# Patient Record
Sex: Female | Born: 1955 | Race: White | Hispanic: No | Marital: Single | Smoking: Never smoker
Health system: Southern US, Community
[De-identification: ages and names within clinical notes are randomized; demographics above are authoritative.]

## PROBLEM LIST (undated history)

## (undated) DIAGNOSIS — IMO0001 Reserved for inherently not codable concepts without codable children: Secondary | ICD-10-CM

## (undated) DIAGNOSIS — M545 Low back pain, unspecified: Secondary | ICD-10-CM

## (undated) DIAGNOSIS — Z9109 Other allergy status, other than to drugs and biological substances: Secondary | ICD-10-CM

## (undated) DIAGNOSIS — R590 Localized enlarged lymph nodes: Secondary | ICD-10-CM

## (undated) DIAGNOSIS — K5909 Other constipation: Secondary | ICD-10-CM

## (undated) DIAGNOSIS — D509 Iron deficiency anemia, unspecified: Secondary | ICD-10-CM

## (undated) DIAGNOSIS — IMO0002 Reserved for concepts with insufficient information to code with codable children: Secondary | ICD-10-CM

## (undated) DIAGNOSIS — Z923 Personal history of irradiation: Secondary | ICD-10-CM

## (undated) DIAGNOSIS — C539 Malignant neoplasm of cervix uteri, unspecified: Secondary | ICD-10-CM

## (undated) DIAGNOSIS — N289 Disorder of kidney and ureter, unspecified: Secondary | ICD-10-CM

## (undated) DIAGNOSIS — N135 Crossing vessel and stricture of ureter without hydronephrosis: Secondary | ICD-10-CM

## (undated) DIAGNOSIS — J45909 Unspecified asthma, uncomplicated: Secondary | ICD-10-CM

## (undated) DIAGNOSIS — Z8742 Personal history of other diseases of the female genital tract: Secondary | ICD-10-CM

## (undated) DIAGNOSIS — L309 Dermatitis, unspecified: Secondary | ICD-10-CM

## (undated) DIAGNOSIS — Z95828 Presence of other vascular implants and grafts: Secondary | ICD-10-CM

## (undated) DIAGNOSIS — K269 Duodenal ulcer, unspecified as acute or chronic, without hemorrhage or perforation: Secondary | ICD-10-CM

## (undated) DIAGNOSIS — K649 Unspecified hemorrhoids: Secondary | ICD-10-CM

## (undated) HISTORY — DX: Personal history of irradiation: Z92.3

## (undated) HISTORY — PX: COLPOSCOPY: SHX161

## (undated) HISTORY — DX: Unspecified asthma, uncomplicated: J45.909

## (undated) HISTORY — DX: Unspecified hemorrhoids: K64.9

---
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
## 2013-01-24 ENCOUNTER — Other Ambulatory Visit: Payer: Self-pay | Admitting: Obstetrics & Gynecology

## 2013-01-24 DIAGNOSIS — N889 Noninflammatory disorder of cervix uteri, unspecified: Secondary | ICD-10-CM

## 2013-01-25 ENCOUNTER — Ambulatory Visit
Admission: RE | Admit: 2013-01-25 | Discharge: 2013-01-25 | Disposition: A | Discharge status: Home / Self Care | Payer: BC Managed Care – PPO | Source: Ambulatory Visit | Attending: Obstetrics & Gynecology | Admitting: Obstetrics & Gynecology

## 2013-01-25 ENCOUNTER — Other Ambulatory Visit: Payer: Self-pay | Admitting: Obstetrics & Gynecology

## 2013-01-25 DIAGNOSIS — N889 Noninflammatory disorder of cervix uteri, unspecified: Secondary | ICD-10-CM

## 2013-01-25 MED ORDER — IOHEXOL 300 MG/ML  SOLN
125.0000 mL | Freq: Once | INTRAMUSCULAR | Status: AC | PRN
  Administered 2013-01-25: 125 mL via INTRAVENOUS

## 2013-01-26 ENCOUNTER — Encounter: Payer: Self-pay | Admitting: Gynecologic Oncology

## 2013-01-27 ENCOUNTER — Ambulatory Visit: Payer: BC Managed Care – PPO | Attending: Gynecology | Admitting: Gynecology

## 2013-01-27 ENCOUNTER — Telehealth: Payer: Self-pay | Admitting: Oncology

## 2013-01-27 ENCOUNTER — Ambulatory Visit (HOSPITAL_COMMUNITY)
Admission: RE | Admit: 2013-01-27 | Discharge: 2013-01-27 | Disposition: A | Discharge status: Home / Self Care | Payer: BC Managed Care – PPO | Source: Ambulatory Visit | Attending: Gynecologic Oncology | Admitting: Gynecologic Oncology

## 2013-01-27 ENCOUNTER — Telehealth: Payer: Self-pay | Admitting: *Deleted

## 2013-01-27 ENCOUNTER — Encounter: Payer: Self-pay | Admitting: Gynecology

## 2013-01-27 VITALS — BP 147/80 | HR 97 | Temp 97.7°F | Resp 16 | Ht 67.0 in | Wt 205.9 lb

## 2013-01-27 DIAGNOSIS — C539 Malignant neoplasm of cervix uteri, unspecified: Secondary | ICD-10-CM | POA: Insufficient documentation

## 2013-01-27 DIAGNOSIS — R599 Enlarged lymph nodes, unspecified: Secondary | ICD-10-CM | POA: Insufficient documentation

## 2013-01-27 DIAGNOSIS — K7689 Other specified diseases of liver: Secondary | ICD-10-CM | POA: Insufficient documentation

## 2013-01-27 DIAGNOSIS — Z880 Allergy status to penicillin: Secondary | ICD-10-CM | POA: Insufficient documentation

## 2013-01-27 DIAGNOSIS — M948X9 Other specified disorders of cartilage, unspecified sites: Secondary | ICD-10-CM | POA: Insufficient documentation

## 2013-01-27 DIAGNOSIS — N1330 Unspecified hydronephrosis: Secondary | ICD-10-CM | POA: Insufficient documentation

## 2013-01-27 DIAGNOSIS — R1909 Other intra-abdominal and pelvic swelling, mass and lump: Secondary | ICD-10-CM | POA: Insufficient documentation

## 2013-01-27 DIAGNOSIS — R911 Solitary pulmonary nodule: Secondary | ICD-10-CM | POA: Insufficient documentation

## 2013-01-27 DIAGNOSIS — N898 Other specified noninflammatory disorders of vagina: Secondary | ICD-10-CM | POA: Insufficient documentation

## 2013-01-27 DIAGNOSIS — I998 Other disorder of circulatory system: Secondary | ICD-10-CM | POA: Insufficient documentation

## 2013-01-27 DIAGNOSIS — J45909 Unspecified asthma, uncomplicated: Secondary | ICD-10-CM | POA: Insufficient documentation

## 2013-01-27 DIAGNOSIS — M25559 Pain in unspecified hip: Secondary | ICD-10-CM | POA: Insufficient documentation

## 2013-01-27 DIAGNOSIS — Z91040 Latex allergy status: Secondary | ICD-10-CM | POA: Insufficient documentation

## 2013-01-27 NOTE — Telephone Encounter (Signed)
Per NP, notified pt Xray of Hip- no evidence of cancer. Pt to follow up with PCP if pain or discomfort continue. Pt verbalized understanding. No further concerns.

## 2013-01-27 NOTE — Telephone Encounter (Signed)
C/D 01/27/13 for appt. 02/03/13

## 2013-01-27 NOTE — Progress Notes (Signed)
Consult Note: Gyn-Onc   Gina Barnett 58 y.o. female  Chief Complaint  Patient presents with  . 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 Left hydronephrosis.  Plan: We'll refer the patient to radiation oncology and medical oncology to initiate treatment. I believe she would benefit from extended field radiation therapy and concurrent weekly cisplatin used as a radiation sensitizer.  The patient may need to be transfused so that her hemoglobin is over 10/hematocrit over 30 to improve results of radiation therapy.  We will obtain a hip x-ray today to make sure the patient doesn't have a bony metastasis causing her right hip pain.  The natural history of this disease, the treatment plan, and survival statistics were provided the patient and her friend.   We will check a serum creatinine. As long as her renal function is normal I would not want to place a ureteral stent or percutaneous nephrostomy.  HPI: 58 year old white single female seen in consultation at the request of Dr.Lavoie regarding management of a newly diagnosed squamous cell carcinoma cervix.  The patient reports she has never had a Pap smear. She initially presented with heavy vaginal bleeding. Examination revealed an obvious malignancy and biopsies have been obtained from the upper vagina and lower anterior vagina both of which showed invasive moderately differentiated squamous cell carcinoma. Subsequently, CT scan was obtained showing significant findings including a large central pelvic mass, pelvic and periaortic adenopathy, moderate left hydronephrosis. There is a small lesion in the right lobe of the liver as well as a 4 mm lesion in the right lower lobe lung.  Patient denies any flank pain or any fever or chills. For the last 4 days the patient's had considerable pain in her right hip assisting the use of crutches. She believes this is secondary to sleeping  in a recliner in the hospital room all she's been accompaning her roommate who was hospitalized currently.  Review of Systems:10 point review of systems is negative except as noted in interval history.   Vitals: Blood pressure 147/80, pulse 97, temperature 97.7 F (36.5 C), temperature source Oral, resp. rate 16, height 5\' 7"  (1.702 m), weight 205 lb 14.4 oz (93.396 kg).  Physical Exam: General : The patient is a healthy woman in no acute distress.  HEENT: normocephalic, extraoccular movements normal; neck is supple without thyromegally  Lynphnodes: Supraclavicular and inguinal nodes not enlarged  Abdomen: Soft, non-tender, no ascites, no organomegally, no masses, no hernias  Pelvic:  EGBUS: Normal female  Vagina: The anterior vaginal wall is a nodular lesion approximately halfway down the vagina. Urethra and Bladder: Normal, non-tender  Cervix: The cervix and replaced by a fungating tumor extending to both pelvic sidewalls. Uterus: Difficult to outline secondary to very large pelvic mass Bi-manual examination: Non-tender; the cervical tumor extending to both pelvic sidewalls Rectal: normal sphincter tone, no masses, no blood  Lower extremities: No edema or varicosities.        Allergies  Allergen Reactions  . Latex Itching    Hand swelling  . Penicillins Hives and Swelling    Past Medical History  Diagnosis Date  . Asthma   . Hemorrhoids   . Elevated CEA     Past Surgical History  Procedure Laterality Date  . Tonsillectomy      45 years ago    Current Outpatient Prescriptions  Medication Sig Dispense Refill  . albuterol (PROVENTIL HFA;VENTOLIN HFA) 108 (90 BASE)  MCG/ACT inhaler Inhale into the lungs every 6 (six) hours as needed for wheezing or shortness of breath.      . Budesonide (PULMICORT IN) Inhale 2 puffs into the lungs daily.       . Ferrous Sulfate (IRON) 325 (65 FE) MG TABS Take by mouth 3 (three) times daily.      Marland Kitchen Fexofenadine HCl (ALLEGRA PO) Take by  mouth daily.       . Ibuprofen (ADVIL PO) Take by mouth 2 (two) times daily.       . Levofloxacin (LEVAQUIN PO) Take by mouth daily. For possible pneumonia      . Pseudoephedrine-DM-GG-APAP (SUDAFED COLD/COUGH PO) Take by mouth daily.      Marland Kitchen VITAMIN D, ERGOCALCIFEROL, PO Take 5,000 mg by mouth daily.        No current facility-administered medications for this visit.    History   Social History  . Marital Status: Single    Spouse Name: N/A    Number of Children: N/A  . Years of Education: N/A   Occupational History  . Not on file.   Social History Main Topics  . Smoking status: Never Smoker   . Smokeless tobacco: Not on file  . Alcohol Use: No  . Drug Use: No  . Sexual Activity: Not on file   Other Topics Concern  . Not on file   Social History Narrative  . No narrative on file    Family History  Problem Relation Age of Onset  . Heart disease Mother   . Heart disease Father   . Hypertension Father       Alvino Chapel, MD 01/27/2013, 8:30 AM

## 2013-01-27 NOTE — Patient Instructions (Signed)
We will contact you with the results of your hip xray from today.  Plan to see Dr. Gery Pray in Radiation on 02/06/13 at 10:30am with arrival time at 10:15am and Dr. Evlyn Clines in Medical Oncology for treatment of cervical cancer.  You will be contacted by the [facility] with a date and time to see Dr. Marko Plume at the [facility].    Cervical Cancer The cervix is the opening and bottom part of the uterus between the vagina and the uterus. Cervical cancer is a fairly common cancer. It occurs most often in women between the ages of 67 years and 32 years. Cells of the cervix act very much like skin cells. These cells are exposed to toxins, viruses, and bacteria that may cause abnormal changes.  There are two kinds of cancers of the cervix:   Squamous cell carcinoma This type of cancer starts in the flat or scale-like cells that line the cervix. Squamous cell carcinoma can develop from a sexually transmitted infection caused by the human papillomavirus (HPV).   Adenocarcinoma This type of cervical cancer starts in glandular cells that line the cervix. RISK FACTORS The risk of getting cancer of the cervix is related to your lifestyle, sexual history, health, and immune system. Risks for cervical cancer include:   Having a sexually transmitted viral infection. These include:  Chlamydia.   Herpes.   HPV.  Becoming sexually active before age 43 years.   Having more than one sexual partner or having sex with someone who has more than one sexual partner.   Not using condoms with sexual partners.   Having had cancer of the vagina or vulva.   Having a sexual partner who has or had cancer of the penis or who has had a sexual partner with cervical dysplasia or cervical cancer.   Using oral contraceptives (also called birth control pills).  Smoking.   Having a weakened immune system. For example, human immunodeficiency virus (HIV) or other immune deficiency disorders.    Being the daughter of a woman who took diethylstilbestrol (DES) during pregnancy.   Having a sister or mother who has had cancer of the cervix.   Being [place] American, Hispanic, Asian, or a woman from the [place].   A history of dysplasia of the cervix. SIGNS AND SYMPTOMS  Symptoms are usually not present in the early stages of cervical cancer. Once the cancer invades the cervix and surrounding tissues, the woman may have:   Abnormal vaginal bleeding or menstrual bleeding that is longer or heavier than usual.   Bleeding after intercourse, douching, or a Pap test.   Vaginal bleeding following menopause.   Abnormal vaginal discharge.  Pelvic discomfort or pain.  An abnormal Pap test.  Pain during sexual intercourse. Symptoms of more advanced cervical cancer may include:   Loss of appetite or weight loss.   Tiredness (fatigue).   Back and leg pain.   Inability to control urination or bowel movements. DIAGNOSIS  A pelvic exam and Pap test are done to diagnose the condition. If abnormalities are found during the exam or Pap test, the Pap test may be repeated in 3 months, or your health care provider may do additional tests or procedures, such as:   A colposcopy This is a procedure that uses a special microscope that allows the health care provider to magnify and closely examine the cells of the cervix, vagina, and vulva.   Cervical biopsies This is a procedure where small tissue samples are  taken from the cervix to be examined under a microscope by a specialist.   A cone biopsy This is a procedure to test for or remove cancerous tissue.  Other tests may be needed, including:   Cystoscopy.   Proctoscopy or sigmoidoscopy.   Ultrasound.   CT scan.   MRI.   Laparoscopy.  There are different stages of cervical cancer:   Stage 0, carcinoma in situ (CIS) This first stage of cancer is the last and most serious stage of dysplasia.   Stage  1 This means the tumor is in the uterus and cervix only.   Stage 2 This means the tumor has spread to the upper vagina. The cancer has spread beyond the uterus, but not to the pelvic walls or lower third of the vagina.   Stage 3 This means the tumor has invaded the side wall of the pelvis and the lower third of the vagina. Blockage of the tubes that carry urine (ureters) from the tumor may cause urine to back up and cause the kidneys to swell (hydronephrosis).   Stage 4 This means the tumor has spread to the rectum or bladder. In the later part of this stage, it has also spread to distant organs, like the lungs.  TREATMENT  Treatment options can include:   Cone biopsy to remove the cancerous tissue.   Removal of the entire uterus and cervix.   Removal of the uterus, cervix, upper vagina, lymph nodes, and surrounding tissue (modified radical hysterectomy). The ovaries may be left in place or removed.   Medicines to treat cancer.   A combination of surgery, radiation, and chemotherapy.   Biological response modifiers. These are substances that help strengthen your immune system's fight against cancer or infection. They may be used in combination with chemotherapy.  HOME CARE INSTRUCTIONS   Get a gynecology exam and Pap test once every year or as directed by your health care provider.   Get the HPV vaccine.   Do not smoke.  Do not have sexual intercourse until your health care provider says it is okay.  Use a condom every time you have sex. SEEK MEDICAL CARE IF:   You have increased pelvic pain or pressure.   Your are becoming increasingly tired.   You have increased leg or back pain.   You have a fever.  You have abnormal bleeding or discharge.  You lose weight. SEEK IMMEDIATE MEDICAL CARE IF:   You cannot urinate.  You have blood in your urine.   You have blood or pressure with a bowel movement.   You develop severe back, stomach, or pelvic  pain. Document Released: 12/29/2004 Document Revised: 08/31/2012 Document Reviewed: 06/22/2012 [facility] Patient Information 2014 [place].

## 2013-01-30 ENCOUNTER — Telehealth: Payer: Self-pay | Admitting: Oncology

## 2013-01-30 NOTE — Telephone Encounter (Signed)
S/w pt and gve np appt 01/23 @ 1:30 w/Dr. Marko Plume, Chemo Edu 01/22 @ 10:30 Calendar mailed.

## 2013-01-30 NOTE — Telephone Encounter (Signed)
C/D 01/30/13 for appt. 02/03/13

## 2013-02-01 ENCOUNTER — Encounter: Payer: Self-pay | Admitting: Radiation Oncology

## 2013-02-01 ENCOUNTER — Ambulatory Visit
Admission: RE | Admit: 2013-02-01 | Discharge: 2013-02-01 | Disposition: A | Discharge status: Home / Self Care | Payer: BC Managed Care – PPO | Source: Ambulatory Visit | Attending: Radiation Oncology | Admitting: Radiation Oncology

## 2013-02-01 VITALS — BP 143/79 | HR 94 | Temp 98.3°F | Ht 67.0 in | Wt 206.5 lb

## 2013-02-01 DIAGNOSIS — C539 Malignant neoplasm of cervix uteri, unspecified: Secondary | ICD-10-CM

## 2013-02-01 DIAGNOSIS — N1330 Unspecified hydronephrosis: Secondary | ICD-10-CM | POA: Insufficient documentation

## 2013-02-01 DIAGNOSIS — J45909 Unspecified asthma, uncomplicated: Secondary | ICD-10-CM | POA: Insufficient documentation

## 2013-02-01 DIAGNOSIS — Z79899 Other long term (current) drug therapy: Secondary | ICD-10-CM | POA: Insufficient documentation

## 2013-02-01 HISTORY — DX: Malignant neoplasm of cervix uteri, unspecified: C53.9

## 2013-02-01 NOTE — Progress Notes (Signed)
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 female who is seen out of the courtesy of Dr. Carlena Bjornstad for an opinion concerning radiation therapy as part of management of patient's recently diagnosed advanced cervical cancer..The patient reports she has never had a Pap smear. She initially presented with heavy vaginal bleeding. Examination revealed an obvious malignancy and biopsies and biopsies were obtained from the upper vagina and lower anterior vagina,  both of which showed invasive moderately differentiated squamous cell carcinoma. Subsequently, CT scan was obtained showing significant findings including a large central pelvic mass, pelvic and periaortic adenopathy, moderate left hydronephrosis. There is a small lesion in the right lobe of the liver as well as a 4 mm lesion in the right lower lobe lung.  Recently the patient is been having significant bleeding and is seen on urgent basis for consideration for radiation treatment.   PREVIOUS RADIATION THERAPY: No  PAST MEDICAL HISTORY:  has a past medical history of Asthma; Hemorrhoids; Elevated CEA; and Cervical cancer.    PAST SURGICAL HISTORY: Past Surgical History  Procedure Laterality Date  . Tonsillectomy      45 years ago  . Colposcopy      FAMILY HISTORY: family history includes Heart disease in her father and mother; Hypertension in her father; Leukemia in her paternal grandfather.  SOCIAL HISTORY:  reports that she has never smoked. She does not have any smokeless tobacco history on file. She reports  that she does not drink alcohol or use illicit drugs. she works as a Community education officer.  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  She is accompanied by a nurse who is a good friend and works with her at the same company.  ALLERGIES: Latex and Penicillins  MEDICATIONS:  Current Outpatient Prescriptions  Medication Sig Dispense Refill  . albuterol (PROVENTIL HFA;VENTOLIN HFA) 108 (90 BASE) MCG/ACT inhaler Inhale into the lungs every 6 (six) hours as needed for wheezing or shortness of breath.      . bisacodyl (DULCOLAX) 5 MG EC tablet Take 5 mg by mouth daily as needed for moderate constipation.      . Budesonide (PULMICORT IN) Inhale 2 puffs into the lungs daily.       . clobetasol cream (TEMOVATE) 5.80 % Apply 1 application topically 2 (two) times daily.      . Ferrous Sulfate (IRON) 325 (65 FE) MG TABS Take by mouth 3 (three) times daily.      Marland Kitchen Fexofenadine HCl (ALLEGRA PO) Take by mouth daily.       . Ibuprofen (ADVIL PO) Take by mouth 2 (two) times daily.       . Levofloxacin (LEVAQUIN PO) Take by mouth daily. For possible pneumonia      . polyethylene glycol (MIRALAX / GLYCOLAX) packet Take 17 g by mouth daily.      . Pseudoephedrine-DM-GG-APAP (SUDAFED COLD/COUGH PO) Take by mouth daily.      Marland Kitchen triamcinolone cream (KENALOG) 0.1 % Apply 1 application topically as needed.      Marland Kitchen VITAMIN D,  ERGOCALCIFEROL, PO Take 5,000 mg by mouth daily.        No current facility-administered medications for this encounter.    REVIEW OF SYSTEMS:  A 15 point review of systems is documented in the electronic medical record. This was obtained by the nursing staff. However, I reviewed this with the patient to discuss relevant findings and make appropriate changes.  Vaginal bleeding and pelvic pain. 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 She has been using crutches for this issue. Plain x-ray of the right hip showed no evidence of metastasis. Recent CT scans also showed no osseous involvement of the right pelvis region. She denies any hematuria or rectal bleeding. She has had some constipation. She denies any headaches cough or breathing problems.   PHYSICAL EXAM:  height is 5\' 7"  (1.702 m) and weight is 206 lb 8 oz (93.668 kg). Her temperature is 98.3 F (36.8 C). Her blood pressure is 143/79 and her pulse is 94. Her oxygen saturation is 99%.   BP 143/79  Pulse 94  Temp(Src) 98.3 F (36.8 C)  Ht 5\' 7"  (1.702 m)  Wt 206 lb 8 oz (93.668 kg)  BMI 32.33 kg/m2  SpO2 99%  General Appearance:    Alert, cooperative, no distress, appears stated age  Head:    Normocephalic, without obvious abnormality, atraumatic  Eyes:    PERRL, conjunctiva/corneas clear, EOM's intact,      Ears:    Normal TM's and external ear canals, both ears  Nose:   Nares normal, septum midline, mucosa normal, no drainage    or sinus tenderness  Throat:   Lips, mucosa, and tongue normal; teeth and gums normal  Neck:   Supple, symmetrical, trachea midline, no adenopathy;    thyroid:  no enlargement/tenderness/nodules; no carotid   bruit or JVD  Back:     Symmetric, no curvature, ROM normal, no CVA tenderness  Lungs:     Clear to auscultation bilaterally, respirations unlabored  Chest Wall:    No tenderness or deformity   Heart:    Regular rate and rhythm, S1 and S2 normal, no murmur, rub   or gallop     Abdomen:     Soft, non-tender, bowel sounds active all four quadrants,    no masses, no organomegaly  Genitalia:    the vaginal vault is significantly narrowed and only permits one  finger. There are moncels present throughout the vaginal vault. There is a fungating tumor starting at the upper vaginal region. On bimanual and rectovaginal examination this appears to extend both pelvic sidewalls. Findings suggestive  of a "frozen pelvis"   Rectal:    Normal tone, , no masses or tenderness;    Extremities:   Extremities normal, atraumatic, no cyanosis or edema  Pulses:   2+ and symmetric all extremities  Skin:   Skin color, texture, turgor normal, no rashes or lesions  Lymph nodes:   Cervical, supraclavicular, and axillary nodes normal, no palpable inguinal adenopathy   Neurologic:    normal strength, sensation and reflexes    throughout    ECOG = 1  1 - Symptomatic but completely ambulatory (Restricted in physically strenuous activity but ambulatory and able to carry out work of a light or sedentary nature. For example, light housework, office work)   LABORATORY DATA:  Lab Results  Component Value Date   WBC 8.6  02/02/2013   Lab Results  Component Value Date   NA 138 02/02/2013   Lab Results  Component Value Date   ALT 12 02/02/2013     RADIOGRAPHY: Dg Hip Complete Right  01/27/2013   CLINICAL DATA:  New onset right hip a for 1 week. No trauma. Personal history of cervical cancer.  EXAM: RIGHT HIP - COMPLETE 2+ VIEW  COMPARISON:  None.  FINDINGS: Oral contrast from prior CT is present within the colon. The hip joint spaces are normal and symmetric bilaterally. No destructive osseous lesion. Phleboliths are present in the anatomic pelvis. Faint area sclerosis is present in the right femoral neck, most compatible with either a bone island or more likely, a small benign synovial herniation cyst. On the frontal view, this has benign appearing sclerotic borders, compatible with a benign etiology.  IMPRESSION: No destructive osseous lesions or acute abnormality. Small sclerotic focus in the right femoral neck is most compatible with a small synovial herniation cyst.   Electronically Signed   By: Dereck Ligas M.D.   On: 01/27/2013 10:09   Ct Chest W Contrast Ct Abdomen Pelvis W Contrast  01/25/2013   CLINICAL DATA:  Cervical/ vaginal mass. Biopsy positive. Severe low back pain and right hip pain for 5  days. Weight loss. Constant vaginal bleeding for 3 weeks.  EXAM: CT CHEST, ABDOMEN, AND PELVIS WITH CONTRAST  TECHNIQUE: Multidetector CT imaging of the chest, abdomen and pelvis was performed following the standard protocol during bolus administration of intravenous contrast.  CONTRAST:  168mL OMNIPAQUE IOHEXOL 300 MG/ML  SOLN  COMPARISON:  None  FINDINGS: CT CHEST FINDINGS  Lungs/Pleura: Subpleural 4 mm right lower lobe lung nodule on image 45.  Patchy left lower lobe airspace disease.  No pleural fluid.  Heart/Mediastinum: No supraclavicular adenopathy. Normal heart size, without pericardial effusion. No central pulmonary embolism, on this non-dedicated study. No mediastinal or hilar adenopathy.  CT ABDOMEN AND PELVIS FINDINGS  Abdomen/Pelvis: Too small to characterize right hepatic lobe 9 mm lesion on image 54/series 2. Focal steatosis adjacent the falciform ligament. Normal spleen, stomach, pancreas, gallbladder, biliary tract, adrenal glands, right kidney.  Moderate left-sided urinary tract obstruction with delayed contrast excretion into the left renal collecting system. Hydroureter continues to the level of the pelvic mass detailed below.  Retroperitoneal adenopathy, with a left periaortic nodal conglomerate measuring 2.2 x 2.1 cm on image 78/series 2. More cephalad left periaortic node measures 1.3 cm on image 70.  No retrocrural adenopathy.  The rectum and sigmoid are intimately associated with the pelvic mass detailed below. No obstruction. Scattered colonic diverticula. Normal terminal ileum and appendix.  Normal small bowel without abdominal ascites. No definite findings of omental/ peritoneal metastasis. There is a fat containing ventral abdominal wall hernia which contains a 1.1 cm low-density nodule on image 85/series 2.  Left inguinal node measures 1.7 cm on image 117, suspicious. Pelvic sidewalls are poorly evaluated secondary to the size of the pelvic mass. Right pelvic adenopathy versus soft  tissue lesion exophytic off the right ovary measures 4.5 x 3.6 cm on image 97/series 2.  The uterine fundus is relatively normal. A necrotic mass is centered in the lower uterine segment and cervix. This measures 10.8 x 8.7 cm on transverse image 108. 10.0 cm craniocaudal on sagittal image 99. Gas in its inferior portions, presumably related to necrosis. Extension into the vagina, including on image 120/series 2.  A tampon is in place. Adenopathy adjacent within the mass, including a 1.3 cm posterior  left-sided pelvic node on image 106/series 2.  Heterogeneity of the left ovary on image 105 for which ovarian metastasis cannot be excluded.  Bones/Musculoskeletal: No acute osseous abnormality. Advanced degenerative disc disease at the lumbosacral junction.  IMPRESSION: CT CHEST IMPRESSION  1.  No acute process or evidence of metastatic disease in the chest. 2. Left lower lobe patchy airspace disease, suspicious for infection. 3. Nonspecific tiny right lower lobe lung nodule.  CT ABDOMEN AND PELVIS IMPRESSION  1. Large pelvic mass, centered at the uterine cervix. Most consistent with locally advanced cervical carcinoma as detailed above. Gas within the mass is favored to be related to necrosis. Direct communication with adjacent bowel cannot be excluded. 2. Pelvic and retroperitoneal abdominal nodal metastasis. 3. Moderate left-sided hydroureteronephrosis, secondary to the pelvic mass. Delayed renal function. 4. Too small to characterize right liver lobe lesion. Favored to represent a small cyst. This could be re-evaluated at followup or more entirely characterize with nonemergent pre and post contrast abdominal MRI. 5. Fat containing ventral abdominal wall hernia. Minimal increased density within. Difficult to entirely exclude isolated omental/peritoneal metastasis. This study was made a "call report".   Electronically Signed   By: Abigail Miyamoto M.D.   On: 01/25/2013 16:43      IMPRESSION: Clinical stage III-B  squamous cell carcinoma of the cervix with left hydronephrosis. The patient also has biopsy-proven upper and lower anterior vaginal vault involvement as well as periaortic nodal involvement.  She in addition may have inguinal involvement as above on CT scan. To more accurately evaluate the extent of disease and to help with radiation planning the patient will proceed with a PET/CT scan at [facility] early next week. Patient will be a candidate for an aggressive course of radiation along with radiosensitizing chemotherapy. Overall her performance status is good.  She would also be a candidate for brachytherapy as part of her overall management. In light of the significantly narrowed vaginal vault involvement I do not feel she would be a candidate for tandem/ring high-dose rate treatments. This would not cover the vaginal extension either. Patient may require an interstitial implant as part of her brachytherapy management and I will refer the patient to [facility] for evaluation of this issue.  PLAN: Simulation and planning tomorrow. Patient's PET scan early next week will be merged with her planning CT scan. At this time she has essentially stopped bleeding but if she develops more significant bleeding she will undergo urgent  treatment. She will be seen by medical oncology in the near future. I spent 60 minutes minutes face to face with the patient and more than 50% of that time was spent in counseling and/or coordination of care.   ------------------------------------------------  -----------------------------------  Blair Promise, PhD, MD

## 2013-02-01 NOTE — Progress Notes (Signed)
Please see the Nurse Progress Note in the MD Initial Consult Encounter for this patient. 

## 2013-02-01 NOTE — Progress Notes (Signed)
GYN Location of Tumor / Histology: Clinical stage IIIB squamous cell carcinoma of the cervix   Patient presented with heavy vaginal bleeding.   Biopsies revealed:    Past/Anticipated interventions by Gyn/Onc surgery, if any: biopsy done 01/23/13 by Dr. Fermin Schwab  Past/Anticipated interventions by medical oncology, if any: Has an appointment with Dr. Marko Plume on Friday.  Weight changes, if any: has lost 10 lbs in last few weeks.  Bowel/Bladder complaints, if any: has constipation - takes miralax and stool softener  Nausea/Vomiting, if any: none    Pain issues, if any:  Has pain in right lower back radiating to right leg and calf.  She is using crutches.  Had a CT done 01/25/13.  SAFETY ISSUES:  Prior radiation? no  Pacemaker/ICD? no  Possible current pregnancy? no  Is the patient on methotrexate? no  Current Complaints / other details:  CT scan done on 01/25/13 shows pelvic and retroperitoneal abdominal nodal metastasis.  Is having black "coffee ground" discharge with foul odor.  Here with her friend.

## 2013-02-02 ENCOUNTER — Ambulatory Visit
Admission: RE | Admit: 2013-02-02 | Discharge: 2013-02-02 | Disposition: A | Discharge status: Home / Self Care | Payer: BC Managed Care – PPO | Source: Ambulatory Visit | Attending: Radiation Oncology | Admitting: Radiation Oncology

## 2013-02-02 ENCOUNTER — Encounter: Payer: Self-pay | Admitting: Oncology

## 2013-02-02 ENCOUNTER — Other Ambulatory Visit: Payer: BC Managed Care – PPO

## 2013-02-02 ENCOUNTER — Encounter: Payer: Self-pay | Admitting: *Deleted

## 2013-02-02 VITALS — BP 153/74 | HR 87 | Temp 97.7°F

## 2013-02-02 DIAGNOSIS — N1330 Unspecified hydronephrosis: Secondary | ICD-10-CM | POA: Insufficient documentation

## 2013-02-02 DIAGNOSIS — R110 Nausea: Secondary | ICD-10-CM | POA: Insufficient documentation

## 2013-02-02 DIAGNOSIS — D649 Anemia, unspecified: Secondary | ICD-10-CM | POA: Insufficient documentation

## 2013-02-02 DIAGNOSIS — C539 Malignant neoplasm of cervix uteri, unspecified: Secondary | ICD-10-CM

## 2013-02-02 DIAGNOSIS — Z510 Encounter for antineoplastic radiation therapy: Secondary | ICD-10-CM | POA: Insufficient documentation

## 2013-02-02 DIAGNOSIS — M545 Low back pain, unspecified: Secondary | ICD-10-CM | POA: Insufficient documentation

## 2013-02-02 DIAGNOSIS — R197 Diarrhea, unspecified: Secondary | ICD-10-CM | POA: Insufficient documentation

## 2013-02-02 DIAGNOSIS — Z79899 Other long term (current) drug therapy: Secondary | ICD-10-CM | POA: Insufficient documentation

## 2013-02-02 LAB — CBC WITH DIFFERENTIAL/PLATELET
BASO%: 0.2 % (ref 0.0–2.0)
Basophils Absolute: 0 10*3/uL (ref 0.0–0.1)
EOS ABS: 0.3 10*3/uL (ref 0.0–0.5)
EOS%: 2.9 % (ref 0.0–7.0)
HCT: 30.3 % — ABNORMAL LOW (ref 34.8–46.6)
HGB: 9.6 g/dL — ABNORMAL LOW (ref 11.6–15.9)
LYMPH%: 14.1 % (ref 14.0–49.7)
MCH: 28 pg (ref 25.1–34.0)
MCHC: 31.7 g/dL (ref 31.5–36.0)
MCV: 88.3 fL (ref 79.5–101.0)
MONO#: 0.7 10*3/uL (ref 0.1–0.9)
MONO%: 7.6 % (ref 0.0–14.0)
NEUT#: 6.5 10*3/uL (ref 1.5–6.5)
NEUT%: 75.2 % (ref 38.4–76.8)
Platelets: 362 10*3/uL (ref 145–400)
RBC: 3.43 10*6/uL — AB (ref 3.70–5.45)
RDW: 13.9 % (ref 11.2–14.5)
WBC: 8.6 10*3/uL (ref 3.9–10.3)
lymph#: 1.2 10*3/uL (ref 0.9–3.3)

## 2013-02-02 LAB — COMPREHENSIVE METABOLIC PANEL (CC13)
ALBUMIN: 3 g/dL — AB (ref 3.5–5.0)
ALK PHOS: 57 U/L (ref 40–150)
ALT: 12 U/L (ref 0–55)
AST: 19 U/L (ref 5–34)
Anion Gap: 7 mEq/L (ref 3–11)
BUN: 12.2 mg/dL (ref 7.0–26.0)
CO2: 26 mEq/L (ref 22–29)
Calcium: 9.6 mg/dL (ref 8.4–10.4)
Chloride: 104 mEq/L (ref 98–109)
Creatinine: 1.1 mg/dL (ref 0.6–1.1)
GLUCOSE: 99 mg/dL (ref 70–140)
POTASSIUM: 4.1 meq/L (ref 3.5–5.1)
SODIUM: 138 meq/L (ref 136–145)
TOTAL PROTEIN: 7.2 g/dL (ref 6.4–8.3)
Total Bilirubin: 0.32 mg/dL (ref 0.20–1.20)

## 2013-02-02 LAB — MAGNESIUM (CC13): MAGNESIUM: 1.9 mg/dL (ref 1.5–2.5)

## 2013-02-02 MED ORDER — SODIUM CHLORIDE 0.9 % IJ SOLN
10.0000 mL | Freq: Once | INTRAMUSCULAR | Status: AC
  Administered 2013-02-02: 10 mL via INTRAVENOUS

## 2013-02-02 NOTE — Progress Notes (Signed)
[facility] Psychosocial Distress Screening Clinical Social Work  Clinical Social Work was referred by distress screening protocol.  The patient scored a 10 on the Psychosocial Distress Thermometer which indicates severe distress. Clinical Social Worker Intern visited Patient in exam room to assess for distress and other psychosocial needs. Patient stated that her severe distress was as a result of current care giving responsibilities and the struggle to balance with current diagnosis. Patient indicated that she has a support system of family and friends that are available to assist her.  Patient has an understanding of her current condition.  CSWI was able to provide supportive listening.  CSWI shared available services and Patient expressed an interest in speaking with the nutritionist or receiving proper diet information.  Patient agreed to seek out further assistance if needed.   Clinical Social Worker follow up needed: no  If yes, follow up plan:   Alana Dayton S. [place] Work Intern [employer] [phone]

## 2013-02-02 NOTE — Progress Notes (Signed)
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 female who is seen out of the courtesy of Dr. Carlena Bjornstad for an opinion concerning radiation therapy as part of management of patient's recently diagnosed advanced cervical cancer..The patient reports she has never had a Pap smear. She initially presented with heavy vaginal bleeding. Examination revealed an obvious malignancy and biopsies and biopsies were obtained from the upper vagina and lower anterior vagina,  both of which showed invasive moderately differentiated squamous cell carcinoma. Subsequently, CT scan was obtained showing significant findings including a large central pelvic mass, pelvic and periaortic adenopathy, moderate left hydronephrosis. There is a small lesion in the right lobe of the liver as well as a 4 mm lesion in the right lower lobe lung.  Recently the patient is been having significant bleeding and is seen on urgent basis for consideration for radiation treatment.   PREVIOUS RADIATION THERAPY: No  PAST MEDICAL HISTORY:  has a past medical history of Asthma; Hemorrhoids; Elevated CEA; and Cervical cancer.    PAST SURGICAL HISTORY: Past Surgical History  Procedure Laterality Date  . Tonsillectomy      45 years ago  . Colposcopy      FAMILY HISTORY: family history includes Heart disease in her father and mother; Hypertension in her father; Leukemia in her paternal grandfather.  SOCIAL HISTORY:  reports that she has never smoked. She does not have any smokeless tobacco history on file. She reports  that she does not drink alcohol or use illicit drugs. she works as a Community education officer.  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  She is accompanied by a nurse who is a good friend and works with her at the same company.  ALLERGIES: Latex and Penicillins  MEDICATIONS:  Current Outpatient Prescriptions  Medication Sig Dispense Refill  . albuterol (PROVENTIL HFA;VENTOLIN HFA) 108 (90 BASE) MCG/ACT inhaler Inhale into the lungs every 6 (six) hours as needed for wheezing or shortness of breath.      . bisacodyl (DULCOLAX) 5 MG EC tablet Take 5 mg by mouth daily as needed for moderate constipation.      . Budesonide (PULMICORT IN) Inhale 2 puffs into the lungs daily.       . clobetasol cream (TEMOVATE) 5.80 % Apply 1 application topically 2 (two) times daily.      . Ferrous Sulfate (IRON) 325 (65 FE) MG TABS Take by mouth 3 (three) times daily.      Marland Kitchen Fexofenadine HCl (ALLEGRA PO) Take by mouth daily.       . Ibuprofen (ADVIL PO) Take by mouth 2 (two) times daily.       . Levofloxacin (LEVAQUIN PO) Take by mouth daily. For possible pneumonia      . polyethylene glycol (MIRALAX / GLYCOLAX) packet Take 17 g by mouth daily.      . Pseudoephedrine-DM-GG-APAP (SUDAFED COLD/COUGH PO) Take by mouth daily.      Marland Kitchen triamcinolone cream (KENALOG) 0.1 % Apply 1 application topically as needed.      Marland Kitchen VITAMIN D,  ERGOCALCIFEROL, PO Take 5,000 mg by mouth daily.        No current facility-administered medications for this encounter.    REVIEW OF SYSTEMS:  A 15 point review of systems is documented in the electronic medical record. This was obtained by the nursing staff. However, I reviewed this with the patient to discuss relevant findings and make appropriate changes.  Vaginal bleeding and pelvic pain. 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 She has been using crutches for this issue. Plain x-ray of the right hip showed no evidence of metastasis. Recent CT scans also showed no osseous involvement of the right pelvis region. She denies any hematuria or rectal bleeding. She has had some constipation. She denies any headaches cough or breathing problems.   PHYSICAL EXAM:  height is 5\' 7"  (1.702 m) and weight is 206 lb 8 oz (93.668 kg). Her temperature is 98.3 F (36.8 C). Her blood pressure is 143/79 and her pulse is 94. Her oxygen saturation is 99%.   BP 143/79  Pulse 94  Temp(Src) 98.3 F (36.8 C)  Ht 5\' 7"  (1.702 m)  Wt 206 lb 8 oz (93.668 kg)  BMI 32.33 kg/m2  SpO2 99%  General Appearance:    Alert, cooperative, no distress, appears stated age  Head:    Normocephalic, without obvious abnormality, atraumatic  Eyes:    PERRL, conjunctiva/corneas clear, EOM's intact,      Ears:    Normal TM's and external ear canals, both ears  Nose:   Nares normal, septum midline, mucosa normal, no drainage    or sinus tenderness  Throat:   Lips, mucosa, and tongue normal; teeth and gums normal  Neck:   Supple, symmetrical, trachea midline, no adenopathy;    thyroid:  no enlargement/tenderness/nodules; no carotid   bruit or JVD  Back:     Symmetric, no curvature, ROM normal, no CVA tenderness  Lungs:     Clear to auscultation bilaterally, respirations unlabored  Chest Wall:    No tenderness or deformity   Heart:    Regular rate and rhythm, S1 and S2 normal, no murmur, rub   or gallop     Abdomen:     Soft, non-tender, bowel sounds active all four quadrants,    no masses, no organomegaly  Genitalia:    the vaginal vault is significantly narrowed and only permits one  finger. There are moncels present throughout the vaginal vault. There is a fungating tumor starting at the upper vaginal region. On bimanual and rectovaginal examination this appears to extend both pelvic sidewalls. Findings suggestive  of a "frozen pelvis"   Rectal:    Normal tone, , no masses or tenderness;    Extremities:   Extremities normal, atraumatic, no cyanosis or edema  Pulses:   2+ and symmetric all extremities  Skin:   Skin color, texture, turgor normal, no rashes or lesions  Lymph nodes:   Cervical, supraclavicular, and axillary nodes normal, no palpable inguinal adenopathy   Neurologic:    normal strength, sensation and reflexes    throughout    ECOG = 1  1 - Symptomatic but completely ambulatory (Restricted in physically strenuous activity but ambulatory and able to carry out work of a light or sedentary nature. For example, light housework, office work)   LABORATORY DATA:  Lab Results  Component Value Date   WBC 8.6  02/02/2013   Lab Results  Component Value Date   NA 138 02/02/2013   Lab Results  Component Value Date   ALT 12 02/02/2013     RADIOGRAPHY: Dg Hip Complete Right  01/27/2013   CLINICAL DATA:  New onset right hip a for 1 week. No trauma. Personal history of cervical cancer.  EXAM: RIGHT HIP - COMPLETE 2+ VIEW  COMPARISON:  None.  FINDINGS: Oral contrast from prior CT is present within the colon. The hip joint spaces are normal and symmetric bilaterally. No destructive osseous lesion. Phleboliths are present in the anatomic pelvis. Faint area sclerosis is present in the right femoral neck, most compatible with either a bone island or more likely, a small benign synovial herniation cyst. On the frontal view, this has benign appearing sclerotic borders, compatible with a benign etiology.  IMPRESSION: No destructive osseous lesions or acute abnormality. Small sclerotic focus in the right femoral neck is most compatible with a small synovial herniation cyst.   Electronically Signed   By: Dereck Ligas M.D.   On: 01/27/2013 10:09   Ct Chest W Contrast Ct Abdomen Pelvis W Contrast  01/25/2013   CLINICAL DATA:  Cervical/ vaginal mass. Biopsy positive. Severe low back pain and right hip pain for 5  days. Weight loss. Constant vaginal bleeding for 3 weeks.  EXAM: CT CHEST, ABDOMEN, AND PELVIS WITH CONTRAST  TECHNIQUE: Multidetector CT imaging of the chest, abdomen and pelvis was performed following the standard protocol during bolus administration of intravenous contrast.  CONTRAST:  167mL OMNIPAQUE IOHEXOL 300 MG/ML  SOLN  COMPARISON:  None  FINDINGS: CT CHEST FINDINGS  Lungs/Pleura: Subpleural 4 mm right lower lobe lung nodule on image 45.  Patchy left lower lobe airspace disease.  No pleural fluid.  Heart/Mediastinum: No supraclavicular adenopathy. Normal heart size, without pericardial effusion. No central pulmonary embolism, on this non-dedicated study. No mediastinal or hilar adenopathy.  CT ABDOMEN AND PELVIS FINDINGS  Abdomen/Pelvis: Too small to characterize right hepatic lobe 9 mm lesion on image 54/series 2. Focal steatosis adjacent the falciform ligament. Normal spleen, stomach, pancreas, gallbladder, biliary tract, adrenal glands, right kidney.  Moderate left-sided urinary tract obstruction with delayed contrast excretion into the left renal collecting system. Hydroureter continues to the level of the pelvic mass detailed below.  Retroperitoneal adenopathy, with a left periaortic nodal conglomerate measuring 2.2 x 2.1 cm on image 78/series 2. More cephalad left periaortic node measures 1.3 cm on image 70.  No retrocrural adenopathy.  The rectum and sigmoid are intimately associated with the pelvic mass detailed below. No obstruction. Scattered colonic diverticula. Normal terminal ileum and appendix.  Normal small bowel without abdominal ascites. No definite findings of omental/ peritoneal metastasis. There is a fat containing ventral abdominal wall hernia which contains a 1.1 cm low-density nodule on image 85/series 2.  Left inguinal node measures 1.7 cm on image 117, suspicious. Pelvic sidewalls are poorly evaluated secondary to the size of the pelvic mass. Right pelvic adenopathy versus soft  tissue lesion exophytic off the right ovary measures 4.5 x 3.6 cm on image 97/series 2.  The uterine fundus is relatively normal. A necrotic mass is centered in the lower uterine segment and cervix. This measures 10.8 x 8.7 cm on transverse image 108. 10.0 cm craniocaudal on sagittal image 99. Gas in its inferior portions, presumably related to necrosis. Extension into the vagina, including on image 120/series 2.  A tampon is in place. Adenopathy adjacent within the mass, including a 1.3 cm posterior  left-sided pelvic node on image 106/series 2.  Heterogeneity of the left ovary on image 105 for which ovarian metastasis cannot be excluded.  Bones/Musculoskeletal: No acute osseous abnormality. Advanced degenerative disc disease at the lumbosacral junction.  IMPRESSION: CT CHEST IMPRESSION  1.  No acute process or evidence of metastatic disease in the chest. 2. Left lower lobe patchy airspace disease, suspicious for infection. 3. Nonspecific tiny right lower lobe lung nodule.  CT ABDOMEN AND PELVIS IMPRESSION  1. Large pelvic mass, centered at the uterine cervix. Most consistent with locally advanced cervical carcinoma as detailed above. Gas within the mass is favored to be related to necrosis. Direct communication with adjacent bowel cannot be excluded. 2. Pelvic and retroperitoneal abdominal nodal metastasis. 3. Moderate left-sided hydroureteronephrosis, secondary to the pelvic mass. Delayed renal function. 4. Too small to characterize right liver lobe lesion. Favored to represent a small cyst. This could be re-evaluated at followup or more entirely characterize with nonemergent pre and post contrast abdominal MRI. 5. Fat containing ventral abdominal wall hernia. Minimal increased density within. Difficult to entirely exclude isolated omental/peritoneal metastasis. This study was made a "call report".   Electronically Signed   By: Abigail Miyamoto M.D.   On: 01/25/2013 16:43      IMPRESSION: Clinical stage III-B  squamous cell carcinoma of the cervix with left hydronephrosis. The patient also has biopsy-proven upper and lower anterior vaginal vault involvement as well as periaortic nodal involvement.  She in addition may have inguinal involvement as above on CT scan. To more accurately evaluate the extent of disease and to help with radiation planning the patient will proceed with a PET/CT scan at [facility] early next week. Patient will be a candidate for an aggressive course of radiation along with radiosensitizing chemotherapy. Overall her performance status is good.  She would also be a candidate for brachytherapy as part of her overall management. In light of the significantly narrowed vaginal vault involvement I do not feel she would be a candidate for tandem/ring high-dose rate treatments. This would not cover the vaginal extension either. Patient may require an interstitial implant as part of her brachytherapy management and I will refer the patient to [facility] for evaluation of this issue.  PLAN: Simulation and planning tomorrow. Patient's PET scan early next week will be merged with her planning CT scan. At this time she has essentially stopped bleeding but if she develops more significant bleeding she will undergo urgent  treatment. She will be seen by medical oncology in the near future. I spent 60 minutes minutes face to face with the patient and more than 50% of that time was spent in counseling and/or coordination of care.   ------------------------------------------------  -----------------------------------  Blair Promise, PhD, MD

## 2013-02-02 NOTE — Progress Notes (Signed)
As of today, no episodes entered in system.

## 2013-02-02 NOTE — Progress Notes (Signed)
Gina Barnett here for IV start.  #22 gauge IV inserted into right AC.  Blood return noted.  Secured with tegaderm and tape.  Patient tolerated well.

## 2013-02-03 ENCOUNTER — Telehealth: Payer: Self-pay | Admitting: Oncology

## 2013-02-03 ENCOUNTER — Telehealth: Payer: Self-pay | Admitting: *Deleted

## 2013-02-03 ENCOUNTER — Encounter: Payer: Self-pay | Admitting: Oncology

## 2013-02-03 ENCOUNTER — Ambulatory Visit: Payer: BC Managed Care – PPO

## 2013-02-03 ENCOUNTER — Other Ambulatory Visit: Payer: Self-pay | Admitting: Oncology

## 2013-02-03 ENCOUNTER — Ambulatory Visit (HOSPITAL_BASED_OUTPATIENT_CLINIC_OR_DEPARTMENT_OTHER): Payer: BC Managed Care – PPO | Admitting: Oncology

## 2013-02-03 ENCOUNTER — Other Ambulatory Visit (HOSPITAL_BASED_OUTPATIENT_CLINIC_OR_DEPARTMENT_OTHER): Payer: BC Managed Care – PPO

## 2013-02-03 VITALS — BP 152/90 | HR 102 | Temp 97.9°F | Resp 20 | Ht 67.0 in | Wt 204.2 lb

## 2013-02-03 DIAGNOSIS — M25559 Pain in unspecified hip: Secondary | ICD-10-CM

## 2013-02-03 DIAGNOSIS — N1330 Unspecified hydronephrosis: Secondary | ICD-10-CM

## 2013-02-03 DIAGNOSIS — Z23 Encounter for immunization: Secondary | ICD-10-CM

## 2013-02-03 DIAGNOSIS — IMO0002 Reserved for concepts with insufficient information to code with codable children: Secondary | ICD-10-CM

## 2013-02-03 DIAGNOSIS — M549 Dorsalgia, unspecified: Secondary | ICD-10-CM

## 2013-02-03 DIAGNOSIS — D500 Iron deficiency anemia secondary to blood loss (chronic): Secondary | ICD-10-CM

## 2013-02-03 DIAGNOSIS — C539 Malignant neoplasm of cervix uteri, unspecified: Secondary | ICD-10-CM

## 2013-02-03 DIAGNOSIS — N898 Other specified noninflammatory disorders of vagina: Secondary | ICD-10-CM

## 2013-02-03 LAB — BASIC METABOLIC PANEL (CC13)
Anion Gap: 8 mEq/L (ref 3–11)
BUN: 12 mg/dL (ref 7.0–26.0)
CALCIUM: 10 mg/dL (ref 8.4–10.4)
CO2: 26 mEq/L (ref 22–29)
Chloride: 104 mEq/L (ref 98–109)
Creatinine: 1.2 mg/dL — ABNORMAL HIGH (ref 0.6–1.1)
GLUCOSE: 99 mg/dL (ref 70–140)
Potassium: 4.1 mEq/L (ref 3.5–5.1)
Sodium: 139 mEq/L (ref 136–145)

## 2013-02-03 LAB — IRON AND TIBC CHCC
%SAT: 9 % — ABNORMAL LOW (ref 21–57)
IRON: 32 ug/dL — AB (ref 41–142)
TIBC: 340 ug/dL (ref 236–444)
UIBC: 308 ug/dL (ref 120–384)

## 2013-02-03 LAB — FERRITIN CHCC: Ferritin: 32 ng/ml (ref 9–269)

## 2013-02-03 MED ORDER — FERROUS FUMARATE 325 (106 FE) MG PO TABS
ORAL_TABLET | ORAL | Status: DC
Start: 2013-02-03 — End: 2013-04-19

## 2013-02-03 MED ORDER — ONDANSETRON HCL 8 MG PO TABS: 8.0000 mg | ORAL_TABLET | Freq: Two times a day (BID) | ORAL | Status: DC | PRN

## 2013-02-03 MED ORDER — INFLUENZA VAC SPLIT QUAD 0.5 ML IM SUSP
0.5000 mL | INTRAMUSCULAR | Status: DC
  Filled 2013-02-03: qty 0.5

## 2013-02-03 MED ORDER — INFLUENZA VAC SPLIT QUAD 0.5 ML IM SUSP
0.5000 mL | Freq: Once | INTRAMUSCULAR | Status: AC
  Administered 2013-02-03: 0.5 mL via INTRAMUSCULAR
  Filled 2013-02-03: qty 0.5

## 2013-02-03 MED ORDER — INFLUENZA VAC SPLIT QUAD 0.5 ML IM SUSP
0.5000 mL | INTRAMUSCULAR | Status: DC
Start: 2013-02-03 — End: 2013-02-03

## 2013-02-03 MED ORDER — LORAZEPAM 1 MG PO TABS: ORAL_TABLET | ORAL | Status: DC

## 2013-02-03 MED ORDER — HYDROCODONE-ACETAMINOPHEN 5-325 MG PO TABS: 1.0000 | ORAL_TABLET | Freq: Four times a day (QID) | ORAL | Status: DC | PRN

## 2013-02-03 NOTE — Telephone Encounter (Signed)
Faxed order to [facility] , confirmation received that the order was received

## 2013-02-03 NOTE — Telephone Encounter (Signed)
Per Dr. Marko Plume, I have scheduled appt for 2/3.  JMW

## 2013-02-03 NOTE — Progress Notes (Signed)
[name]   Name: Gina Barnett Date: 02/03/2013 MRN: [id number] DOB: 02/21/1955  REFERRING PHYSICIAN: D.ClarkePearson CC:J.Kinard, C.Melinda Crutch (PCP), [facility]   REASON FOR REFERRAL: Patient is seen, together with RN friend, for consideration of sensitizing chemotherapy with radiation for recently diagnosed clinical IIIB cervical cancer, referred by Dr Josephina Shih.    HISTORY OF PRESENT ILLNESS  Patient is a 58 yo lady who had never had PAP previously, who presented with heavy vaginal bleeding, with low back pain and ~ 2 weeks of right hip pain. She was seen by Dr Sebastian Ache, with obvious tumor and biopsies from upper vagina and lower anterior vagina showing invasive squamous cell carcinoma. 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 She was referred to Dr Josephina Shih 01-27-13, his exam remarkable for cervix replaced by fungating tumor extending to both pelvic sidewalls and nodular lesion halfway down anterior vaginal wall. He felt that ureteral stent was not needed as long as creatinine normal. Right hip xray 01-27-13 showed no bony mets. She has had consultation visit with Dr Gery Pray and is to begin radiation 02-13-2013, continuing thru ~ 03-17-2013. She has PET ordered but not yet scheduled.  She attended chemotherapy teaching class prior to this visit.   Patient reports very little vaginal bleeding now. She relates right hip pain to sleeping in recliner while caring for friend with MS, this worse with walking, some better with heat/ ice and advil 2 bid. Dr Dellis Filbert began oral iron last week. No other bleeding. Poor appetite with some recent nausea, weight down 10  lbs in past month. No pain left flank, voiding easily, no hematuria.    REVIEW OF SYSTEMS as above, also: No fever or symptoms of infection. No HA, year round environmental allergies for which she uses allegra and sudafed. No known dental problems, for dental cleaning next week. No thyroid disease. Asthma since childhood, managed with prn inhalers. Bowels not moving well, with small caliber stools, and has begun daily miralax. No LE swelling. 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 No blood clots. No other bleeding. No breast complaints. No neurologic symptoms. No mammograms in this EMR Never colonoscopy (knows Dr Cristina Gong, who cares for friend) Angus Seller of full 10 point review of systems negative.   ALLERGIES: Latex and Penicillins  PAST MEDICAL/ SURGICAL HISTORY:    Tonsillectomy Asthma since childhood Environmental allergies Eczema Hemorrhoids Degenerative disc disease lumbosacral spine  CURRENT MEDICATIONS: reviewed as listed now in EMR. She has been taking oral iron on empty stomach, will try this on empty stomach with OJ. She agreed to flu vaccine today, given by RN. Prescriptions to pharmacy for zofran, ativan, hydrocodone and will look into insurance coverage for Hemocyte.  PHARMACY: [facility]   SOCIAL HISTORY: Originally from [place] and [place], in [place] since 1996. Single, lives with friend who is total care related to [place] (patient lifts her in and out of motorized WC, has sitter during day). RN friend who accompanies today is [facility] for patient's friend. Never smoker, no ETOH. 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 She is presently out of work due to acute right hip pain x 2 weeks. She moved to [facility] as brother and sister  in law had also changed professions to PT and live in [place].  FAMILY HISTORY:  Cardiac disease in parents and their families.  Possibly leukemia in paternal grandfather.          PHYSICAL  EXAM:  height is 5\' 7"  (1.702 m) and weight is 204 lb 3.2 oz (92.625 kg). Her oral temperature is 97.9 F (36.6 C). Her blood pressure is 152/90 and her pulse is 102. Her respiration is 20.   Very pleasant lady looks stated age, alert, cooperative, good historian. Using crutch, has difficulty lying supine on exam table due to low back and hip pain.  Friend very supportive.  HEENT: normal hair pattern. PERRL, not icteric. Oral mucosa moist and clear. No obvious dental problems. Neck supple without JVD or thyroid mass.  RESPIRATORY: respirations not labored RA. Clear to auscultation and percussion including LLL posteriorly. No use of accessory muscles  CARDIAC/ VASCULAR: tachycardic, RRR without murmur or gallop.   ABDOMEN: soft and nontender without appreciable mass or HSM. Few bowel sounds.   LYMPH NODES:no cervical, supraclavicular, axillary or inguinal adenopathy  BREASTS: bilaterally without dominant mass, skin or nipple findings  NEUROLOGIC: CN, motor, sensory, cerebellar nonfocal other than possible slight decreased strength right quadriceps  SKIN: no rash, ecchymoses, petechiae. Eczema patches forearms bilaterally  MUSCULOSKELETAL: no CCE. Tender to palpation right SI region.     LABORATORY DATA:  Results for orders placed in visit on 02/03/13 (from the past 48 hour(s))  BASIC METABOLIC PANEL (MG86)     Status: Abnormal   Collection Time    02/03/13 10:51 AM      Result Value Range   Sodium 139  136 - 145 mEq/L   Potassium 4.1  3.5 - 5.1 mEq/L   Chloride 104  98 - 109 mEq/L   CO2 26  22 - 29 mEq/L   Glucose 99  70 - 140 mg/dl   BUN 12.0  7.0 - 26.0 mg/dL   Creatinine 1.2 (*) 0.6 - 1.1 mg/dL   Calcium 10.0  8.4 - 10.4 mg/dL   Anion Gap 8  3 - 11 mEq/L  FERRITIN [facility]     Status: None   Collection Time    02/03/13 10:51 AM      Result Value Range   Ferritin 32  9 - 269 ng/ml  IRON AND TIBC [facility]     Status: Abnormal   Collection Time    02/03/13 10:51 AM      Result  Value Range   Iron 32 (*) 41 - 142 ug/dL   TIBC 340  236 - 444 ug/dL   UIBC 308  120 - 384 ug/dL   %SAT 9 (*) 21 - 57 %     CBC 02-02-13  WBC 8.6, ANC 6.5, Hgb 9.6, plt 362k, MCV 88.3 CMET 02-02-13   BUN 12.2, creat 1.1, LFTs normal, albumin 3.0 with Tprot 7.2, calcium 9.6 Mg++ 02-02-13  1.9 PATHOLOGY: from 01-23-13 moderately differentiated invasive squamous cell carcinoma (scanned under Media)   RADIOGRAPHY: CT CHEST, ABDOMEN, AND PELVIS WITH CONTRAST   01-25-2013  COMPARISON: None  FINDINGS:  CT CHEST FINDINGS  Lungs/Pleura: Subpleural 4 mm right lower lobe lung nodule on image  45.  Patchy left lower lobe airspace disease.  No pleural fluid.  Heart/Mediastinum: No supraclavicular adenopathy. Normal heart size,  without pericardial effusion. No central pulmonary embolism, on this  non-dedicated study. No mediastinal or hilar adenopathy.  CT ABDOMEN AND PELVIS FINDINGS  Abdomen/Pelvis: Too small to characterize  right hepatic lobe 9 mm  lesion on image 54/series 2. Focal steatosis adjacent the falciform  ligament. Normal spleen, stomach, pancreas, gallbladder, biliary  tract, adrenal glands, right kidney.  Moderate left-sided urinary tract obstruction with delayed contrast  excretion into the left renal collecting system. Hydroureter  continues to the level of the pelvic mass detailed below.  Retroperitoneal adenopathy, with a left periaortic nodal  conglomerate measuring 2.2 x 2.1 cm on image 78/series 2. More  cephalad left periaortic node measures 1.3 cm on image 70.  No retrocrural adenopathy.  The rectum and sigmoid are intimately associated with the pelvic  mass detailed below. No obstruction. Scattered colonic diverticula.  Normal terminal ileum and appendix.  Normal small bowel without abdominal ascites. No definite findings  of omental/ peritoneal metastasis. There is a fat containing ventral  abdominal wall hernia which contains a 1.1 cm low-density nodule on  image  85/series 2.  Left inguinal node measures 1.7 cm on image 117, suspicious. Pelvic  sidewalls are poorly evaluated secondary to the size of the pelvic  mass. Right pelvic adenopathy versus soft tissue lesion exophytic  off the right ovary measures 4.5 x 3.6 cm on image 97/series 2.  The uterine fundus is relatively normal. A necrotic mass is centered  in the lower uterine segment and cervix. This measures 10.8 x 8.7 cm  on transverse image 108. 10.0 cm craniocaudal on sagittal image 99.  Gas in its inferior portions, presumably related to necrosis.  Extension into the vagina, including on image 120/series 2.  A tampon is in place. Adenopathy adjacent within the mass, including  a 1.3 cm posterior left-sided pelvic node on image 106/series 2.  Heterogeneity of the left ovary on image 105 for which ovarian  metastasis cannot be excluded.  Bones/Musculoskeletal: No acute osseous abnormality. Advanced  degenerative disc disease at the lumbosacral junction.  IMPRESSION:  CT CHEST IMPRESSION  1. No acute process or evidence of metastatic disease in the chest.  2. Left lower lobe patchy airspace disease, suspicious for  infection.  3. Nonspecific tiny right lower lobe lung nodule.  CT ABDOMEN AND PELVIS IMPRESSION  1. Large pelvic mass, centered at the uterine cervix. Most  consistent with locally advanced cervical carcinoma as detailed  above. Gas within the mass is favored to be related to necrosis.  Direct communication with adjacent bowel cannot be excluded.  2. Pelvic and retroperitoneal abdominal nodal metastasis.  3. Moderate left-sided hydroureteronephrosis, secondary to the  pelvic mass. Delayed renal function.  4. Too small to characterize right liver lobe lesion. Favored to  represent a small cyst. This could be re-evaluated at followup or  more entirely characterize with nonemergent pre and post contrast  abdominal MRI.  5. Fat containing ventral abdominal wall hernia. Minimal  increased  density within. Difficult to entirely exclude isolated  omental/peritoneal metastasis.   RIGHT HIP - COMPLETE 2+ VIEW  01-27-2013 COMPARISON: None.  FINDINGS:  Oral contrast from prior CT is present within the colon. The hip  joint spaces are normal and symmetric bilaterally. No destructive  osseous lesion. Phleboliths are present in the anatomic pelvis.  Faint area sclerosis is present in the right femoral neck, most  compatible with either a bone island or more likely, a small benign  synovial herniation cyst. On the frontal view, this has benign  appearing sclerotic borders, compatible with a benign etiology.  IMPRESSION:  No destructive osseous lesions or acute abnormality. Small sclerotic  focus in the right femoral neck  is most compatible with a small  synovial herniation cyst.      DISCUSSION: we have discussed all of information above and recommendation for sensitizing chemotherapy with radiation. She is comfortable with information from chemotherapy education class and will share written information + her notes with RN friend; they understand that they can call at any time if questions or concerns between scheduled visits. We have discussed mechanism of action of the chemotherapy with RT, and need to keep hemoglobin >=~ 10/ Hct >=30 during radiation. We have discussed oral and IV hydration, usual labs and follow up at this office during treatment, need to be out of work particularly due to the hip and back problems now, medications including antiemetics, laxatives, iron administration and side effects, NSAIDs with food, prn hydrocodone.  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 She will have  chemistries with magnesium on Fridays and CBCs with chemo on Mondays. I will see her with lab also on ~ 1-28 in follow up of back and hip pain, the blood loss anemia and renal function with the left hydronephrosis 2.left hydronephrosis: renal function stable this week and does not seem to have pain related to the left hydronephrosis. Will follow closely without stent for now 3.iron deficiency anemia related to vaginal bleeding: began oral iron just last week, which she will now take on empty stomach with OJ. Consider IV iron and/or PRBCs if hemoglobin does not quickly improve, particularly given RT to start 02-13-13 4. Degenerative disc disease low back. No apparent bony mets by plain xray left hip 5.never colonoscopy. I did not find out if she has had mammograms 6.flu vaccine given today. 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 Lab notified of CDDP labs beginning 02-13-13  Patient and accompanying individuals have had questions answered to their satisfaction and are in agreement with plan above. They can contact this office for questions or concerns at any time prior to next scheduled visit.  Time spent 60+ min , including >50% discussion and coordination of care.    Nakyra Bourn P, MD 02/03/2013 2:04 PM

## 2013-02-03 NOTE — Telephone Encounter (Signed)
gv pt appt schedule for jan thru march.  °

## 2013-02-03 NOTE — Progress Notes (Signed)
Checked in new patient with no financial issues. She has appt card and has not been to [place]. °

## 2013-02-03 NOTE — Telephone Encounter (Signed)
Called patient to inform of labs on 02-07-13 at [facility], spoke with patient and she is aware to be there at 9:00 am

## 2013-02-06 ENCOUNTER — Ambulatory Visit: Payer: BC Managed Care – PPO

## 2013-02-06 ENCOUNTER — Ambulatory Visit: Payer: BC Managed Care – PPO | Admitting: Radiation Oncology

## 2013-02-07 ENCOUNTER — Other Ambulatory Visit: Payer: Self-pay | Admitting: Radiation Oncology

## 2013-02-07 ENCOUNTER — Ambulatory Visit: Payer: Self-pay | Admitting: Radiation Oncology

## 2013-02-07 ENCOUNTER — Other Ambulatory Visit: Payer: Self-pay | Admitting: Oncology

## 2013-02-07 DIAGNOSIS — C539 Malignant neoplasm of cervix uteri, unspecified: Secondary | ICD-10-CM

## 2013-02-07 LAB — HCG, QUANTITATIVE, PREGNANCY: BETA HCG, QUANT.: 13 m[IU]/mL — AB

## 2013-02-08 ENCOUNTER — Telehealth: Payer: Self-pay

## 2013-02-08 ENCOUNTER — Other Ambulatory Visit (HOSPITAL_BASED_OUTPATIENT_CLINIC_OR_DEPARTMENT_OTHER): Payer: BC Managed Care – PPO

## 2013-02-08 ENCOUNTER — Ambulatory Visit (HOSPITAL_BASED_OUTPATIENT_CLINIC_OR_DEPARTMENT_OTHER): Payer: BC Managed Care – PPO | Admitting: Oncology

## 2013-02-08 ENCOUNTER — Encounter: Payer: Self-pay | Admitting: Oncology

## 2013-02-08 VITALS — BP 131/66 | HR 96 | Temp 97.8°F | Resp 20 | Ht 67.0 in | Wt 200.0 lb

## 2013-02-08 DIAGNOSIS — D500 Iron deficiency anemia secondary to blood loss (chronic): Secondary | ICD-10-CM

## 2013-02-08 DIAGNOSIS — N1330 Unspecified hydronephrosis: Secondary | ICD-10-CM

## 2013-02-08 DIAGNOSIS — C539 Malignant neoplasm of cervix uteri, unspecified: Secondary | ICD-10-CM

## 2013-02-08 DIAGNOSIS — G893 Neoplasm related pain (acute) (chronic): Secondary | ICD-10-CM

## 2013-02-08 LAB — BASIC METABOLIC PANEL (CC13)
Anion Gap: 8 mEq/L (ref 3–11)
BUN: 17.5 mg/dL (ref 7.0–26.0)
CALCIUM: 9.6 mg/dL (ref 8.4–10.4)
CHLORIDE: 104 meq/L (ref 98–109)
CO2: 26 mEq/L (ref 22–29)
CREATININE: 1.1 mg/dL (ref 0.6–1.1)
GLUCOSE: 89 mg/dL (ref 70–140)
Potassium: 4.2 mEq/L (ref 3.5–5.1)
Sodium: 138 mEq/L (ref 136–145)

## 2013-02-08 LAB — CBC WITH DIFFERENTIAL/PLATELET
BASO%: 0.6 % (ref 0.0–2.0)
Basophils Absolute: 0 10*3/uL (ref 0.0–0.1)
EOS ABS: 0.2 10*3/uL (ref 0.0–0.5)
EOS%: 2.4 % (ref 0.0–7.0)
HCT: 31.1 % — ABNORMAL LOW (ref 34.8–46.6)
HGB: 10.2 g/dL — ABNORMAL LOW (ref 11.6–15.9)
LYMPH%: 10.3 % — ABNORMAL LOW (ref 14.0–49.7)
MCH: 28.8 pg (ref 25.1–34.0)
MCHC: 32.7 g/dL (ref 31.5–36.0)
MCV: 88.1 fL (ref 79.5–101.0)
MONO#: 0.5 10*3/uL (ref 0.1–0.9)
MONO%: 6.4 % (ref 0.0–14.0)
NEUT#: 6.7 10*3/uL — ABNORMAL HIGH (ref 1.5–6.5)
NEUT%: 80.3 % — AB (ref 38.4–76.8)
PLATELETS: 332 10*3/uL (ref 145–400)
RBC: 3.53 10*6/uL — ABNORMAL LOW (ref 3.70–5.45)
RDW: 14.4 % (ref 11.2–14.5)
WBC: 8.4 10*3/uL (ref 3.9–10.3)
lymph#: 0.9 10*3/uL (ref 0.9–3.3)

## 2013-02-08 MED ORDER — CYCLOBENZAPRINE HCL 10 MG PO TABS: 10.0000 mg | ORAL_TABLET | Freq: Three times a day (TID) | ORAL | Status: DC | PRN

## 2013-02-08 NOTE — Patient Instructions (Signed)
OK to try Aleve instead of advil, as this may last longer --  1 Aleve every 8-12 hrs as needed, take with food OK to take 2 of the hydrocodone 5-325 every 6 hrs if that helps Flexeril 10 mg every 8 hrs as needed for muscle spasm. May make you very drowsy.  Continue Hemocyte 2-3x daily on empty stomach with OJ

## 2013-02-08 NOTE — Progress Notes (Signed)
OFFICE PROGRESS NOTE   02/08/2013   Physicians: D.ClarkePearson,J.Kinard, C.Melinda Crutch (PCP), Roney Mans, Louis Meckel ([facility])   INTERVAL HISTORY:  Patient is seen, alone for visit, in continuing attention to recently diagnosed squamous cell carcinoma of cervix, clinical IIIB. Plan is for radiation with sensitizing cisplatin chemotherapy beginning 02-13-13. She had PET at [facility] on 02-07-13, that report not available until after visit today.  She had brief episode of heavy vaginal bleeding yesterday, none now. She is tolerating Hemocyte bid, which her insurance covered.  CT 01-25-13 showed advanced degenerative disc disease lumbosacral junction as well as retroperitoneal adenopathy, which are thought to be causing pain in low back. Low back pain and especially pain in right buttock  is most severe at night, not improved with hydrocodone; she was able to sleep only total of ~ 2 hrs last pm. She notices more weakness in right quadriceps with this pain.  She is voiding and does not have pain left flank, with known left hydronephrosis by CT 01-25-13 (normal right kidney on that CT).  Patient is having progressively more difficulty lifting physically handicapped friend, but has made arrangements for more help in the home. I have completed disability papers for her work now (copy to be scanned into EMR, original back to patient now).    ONCOLOGIC HISTORY Patient presented with heavy vaginal bleeding, with low back pain and ~ 2 weeks of right hip pain. She was seen by Dr Sebastian Ache, with obvious tumor and biopsies from upper vagina and lower anterior vagina showing invasive squamous cell carcinoma. She had CT CAP in [facility] 01-25-13 which showed necrotic mass centered in lower uterine segment and cervix 10.8 x 8.7 cm, left hydronephrosis, pelvic and retroperitoneal node involvement, rectum and sigmoid intimately associated with pelvic mass but without obstruction, advanced  degenerative disc disease at lumbosacral junction. She was referred to Dr Josephina Shih 01-27-13, his exam remarkable for cervix replaced by fungating tumor extending to both pelvic sidewalls and nodular lesion halfway down anterior vaginal wall. He felt that ureteral stent was not needed as long as creatinine normal. Right hip xray 01-27-13 showed no bony mets.  She has had consultation visit with Dr Gery Pray and is to begin radiation 02-13-2013, continuing thru ~ 03-17-2013.  Review of systems as above, also: No other bleeding. Is eating and drinking fluids. No LE swelling. Bowels moving as when I saw her last week. Remainder of 10 point Review of Systems negative.  Objective:  Vital signs in last 24 hours:  BP 131/66  Pulse 96  Temp(Src) 97.8 F (36.6 C) (Oral)  Resp 20  Ht 5\' 7"  (1.702 m)  Wt 200 lb (90.719 kg)  BMI 31.32 kg/m2 Weight is up 4 lbs.  Alert, oriented and appropriate.Looks mildly uncomfortable but NAD. Ambulatory without difficulty.   HEENT:PERRL, sclerae not icteric. Oral mucosa moist without lesions, posterior pharynx clear.  Lymphatics:no cervical,suraclavicular adenopathy Resp: clear to auscultation bilaterally Cardio: regular rate and rhythm. No gallop. GI: soft, nontender, not distended, no mass or organomegaly. Some bowel sounds. . Musculoskeletal/ Extremities: without pitting edema, cords, tenderness Skin without rash, ecchymosis, petechiae including no rash low back or right hip.   Lab Results:  Results for orders placed in visit on 02/08/13  CBC WITH DIFFERENTIAL      Result Value Range   WBC 8.4  3.9 - 10.3 10e3/uL   NEUT# 6.7 (*) 1.5 - 6.5 10e3/uL   HGB 10.2 (*) 11.6 - 15.9 g/dL   HCT 31.1 (*)  34.8 - 46.6 %   Platelets 332  145 - 400 10e3/uL   MCV 88.1  79.5 - 101.0 fL   MCH 28.8  25.1 - 34.0 pg   MCHC 32.7  31.5 - 36.0 g/dL   RBC 3.53 (*) 3.70 - 5.45 10e6/uL   RDW 14.4  11.2 - 14.5 %   lymph# 0.9  0.9 - 3.3 10e3/uL   MONO# 0.5  0.1 - 0.9  10e3/uL   Eosinophils Absolute 0.2  0.0 - 0.5 10e3/uL   Basophils Absolute 0.0  0.0 - 0.1 10e3/uL   NEUT% 80.3 (*) 38.4 - 76.8 %   LYMPH% 10.3 (*) 14.0 - 49.7 %   MONO% 6.4  0.0 - 14.0 %   EOS% 2.4  0.0 - 7.0 %   BASO% 0.6  0.0 - 2.0 %  BASIC METABOLIC PANEL (0000000)      Result Value Range   Sodium 138  136 - 145 mEq/L   Potassium 4.2  3.5 - 5.1 mEq/L   Chloride 104  98 - 109 mEq/L   CO2 26  22 - 29 mEq/L   Glucose 89  70 - 140 mg/dl   BUN 17.5  7.0 - 26.0 mg/dL   Creatinine 1.1  0.6 - 1.1 mg/dL   Calcium 9.6  8.4 - 10.4 mg/dL   Anion Gap 8  3 - 11 mEq/L     Studies/Results:  PET report from 02-07-13 requested from [facility] and received by fax after patient had left office. This will be scanned into EMR, however report describes large hypermetabolic cervical mass into lower uterine segment, bilateral bulky external iliac nodes and adenopathy presacral space adjacent to rectum,bulky left periaortic adenopathy to level of left renal vein, hypermetabolic right axillary node with normal morphology, left hydronephrosis as previously and new right hydroureter and right hydronephrosis.  Medications: I have reviewed the patient's current medications. Will add flexeril 10 mg tid prn, which she knows may cause drowsiness. She will try aleve instead of present advil. She may need stronger pain medication if this is not helpful enough.  DISCUSSION: following visit when PET information available, I spoke directly with [facility] for next available appointment, as we will need to wait to begin cisplatin until renal obstruction is addressed. That office has made appointment with Dr Louis Meckel for 02-13-13 arrive 2:30 for 3:00 appt. This information also conveyed to patient, who understands that chemotherapy will be delayed but that RT can begin as planned on 02-13-13.   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 RT will begin 02-13-13, and hopefully first cisplatin 02-20-13. She will have chemistries with magnesium on Fridays and CBCs with chemo on Mondays during chemo.   2.Pain not well controlled: likely multifactorial with degenerative disc disease and involvement of pelvis/ presacral area/ retroperitoneal with the cervical cancer. No bone involvement by plain xray of left hip. See meds above. 3.iron deficiency anemia related to vaginal bleeding: hemoglobin a little better already. Can increase oral iron to tid if tolerates. 4..never colonoscopy. 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   Lajean Boese P, MD   02/08/2013, 1:49 PM

## 2013-02-08 NOTE — Telephone Encounter (Signed)
Told Gina Barnett that the Pet Scan showed that the right ureter is not draining well now, tho still better than the left. Per Dr. Marko Plume. She will need a stent before she starts chemotherapy.  Dr. Marko Plume set up an appointment with Dr. Louis Meckel with [facility] for 02-13-13 @ 3 pm.  She needs to register at 2:30 pm. Chemotherapy will be cancelled for 02-13-13.  Will begin chemotherapy on 02-20-13. Dr. Marko Plume said that it is fine to begin radiation as scheduled next week. Gina Barnett the location of [facility].  Gave her the office phone if needed. Gina Barnett will call this office to let Dr. Marko Plume know when stent placement will occur.  Patient verbalized understanding.

## 2013-02-08 NOTE — Progress Notes (Signed)
  Radiation Oncology         [phone] ________________________________  Name: Gina Barnett MRN: [id number]  Date: 02/02/2013  DOB: [birth year]  SIMULATION AND TREATMENT PLANNING NOTE  DIAGNOSIS:  Cervix cancer    Primary site: Cervix Uteri   Staging method: AJCC 7th Edition   Clinical: Stage IIIB (T3b, N1, M0)   Summary: Stage IIIB (T3b, N1, M0)   NARRATIVE:  The patient was brought to the [facility].  Identity was confirmed.  All relevant records and images related to the planned course of therapy were reviewed.  The patient freely provided informed written consent to proceed with treatment after reviewing the details related to the planned course of therapy. The consent form was witnessed and verified by the simulation staff.  Then, the patient was set-up in a stable reproducible  supine position for radiation therapy.  CT images were obtained.  Surface markings were placed.  The CT images were loaded into the planning software.  Then the target and avoidance structures were contoured.  Treatment planning then occurred.  The radiation prescription was entered and confirmed.  Then, I designed and supervised the construction of a total of 1 medically necessary complex treatment devices.  I have requested : 3D Simulation  I have requested a DVH of the following structures: GTV, small bowel, kidneys, .  I have ordered:dose calc.  PLAN:  The patient will receive 45 Gy in 25 fractions along with radiosensitizing chemotherapy, followed by possible additional external beam as well as brachytherapy treatments.    Special treatment procedure note  The patient will be receiving radiosensitizing chemotherapy throughout her course of treatment. Given the increased potential for toxicities as well as the necessity for close monitoring of the patient and blood work, this constitutes a special treatment procedure. -----------------------------------  Blair Promise, PhD, MD

## 2013-02-10 ENCOUNTER — Telehealth: Payer: Self-pay

## 2013-02-10 NOTE — Telephone Encounter (Signed)
Ms. Niziolek called back and left message stating that she is using the hydrocodone at night alone with the flexeril.  She is getting ~3 hours of sleep and then waking up able to get back to sleep for ~ 2 more hours.  This is more sleep then she has been getting. She does not take the pain medication during the day as it makes her too sleepy.   She is not using her crutches and trying to increase her physical activity.  She feel that somthin in her pelvis is pushing on her siatic nerve so she wont have complete releif from pain.

## 2013-02-10 NOTE — Telephone Encounter (Signed)
Faxed information to Dr. Louis Meckel for appointment 02-13-13 as requested by Dr. Marko Plume in noted below.

## 2013-02-10 NOTE — Telephone Encounter (Addendum)
Left message for [name] to callback and let this nurse know if her pain is being controlled with the interventions discussed at 02-08-13 visit with Dr. Marko Plume.            Patient Demographics     Patient Name Sex DOB SSN Address Phone    Gina Barnett, Gina Barnett Female 1955/05/09 [id number] [place] [address] [phone] [facility]) [phone] (Mobile)              Message Received: 1 day ago     Gordy Levan, MD Baruch Merl, RN; Patton Salles, RN            On Friday 1-30, please speak to her by phone, ? Is pain controlled, or does she need other pain med.  Please fax my note 1-28 and PET report from [place] (I have copy) to Dr Louis Meckel at [facility], for new pt apt there 02-13-13 @ 3PM.  On fax cover sheet please put "new cervical cancer with bilateral hydronephrosis. Please consider stent(s) prior to starting cisplatin chemotherapy. Dr Mariana Kaufman pager [phone]. Thank you"   Cc LA, TH

## 2013-02-12 LAB — BETA HCG QUANT (REF LAB): Beta hCG, Tumor Marker: 33.6 m[IU]/mL — ABNORMAL HIGH (ref <5.0)

## 2013-02-13 ENCOUNTER — Ambulatory Visit: Payer: BC Managed Care – PPO

## 2013-02-13 ENCOUNTER — Other Ambulatory Visit: Payer: BC Managed Care – PPO

## 2013-02-13 ENCOUNTER — Other Ambulatory Visit: Payer: Self-pay | Admitting: Urology

## 2013-02-13 ENCOUNTER — Telehealth: Payer: Self-pay | Admitting: Oncology

## 2013-02-13 ENCOUNTER — Encounter (HOSPITAL_BASED_OUTPATIENT_CLINIC_OR_DEPARTMENT_OTHER): Payer: Self-pay | Admitting: *Deleted

## 2013-02-13 ENCOUNTER — Ambulatory Visit
Admission: RE | Admit: 2013-02-13 | Discharge: 2013-02-13 | Disposition: A | Discharge status: Home / Self Care | Payer: BC Managed Care – PPO | Source: Ambulatory Visit | Attending: Radiation Oncology | Admitting: Radiation Oncology

## 2013-02-13 DIAGNOSIS — C539 Malignant neoplasm of cervix uteri, unspecified: Secondary | ICD-10-CM

## 2013-02-13 NOTE — Telephone Encounter (Signed)
Medical Oncology  Phone call this afternoon from Dr Louis Meckel, seeing patient today and plans to place stents 02-16-13, probably bilateral. He will anticipate stent change in 3 months and we will keep him updated with any CT results in interim.  Godfrey Pick, MD

## 2013-02-14 ENCOUNTER — Encounter: Payer: Self-pay | Admitting: Oncology

## 2013-02-14 ENCOUNTER — Encounter (HOSPITAL_BASED_OUTPATIENT_CLINIC_OR_DEPARTMENT_OTHER): Payer: Self-pay | Admitting: *Deleted

## 2013-02-14 ENCOUNTER — Ambulatory Visit
Admission: RE | Admit: 2013-02-14 | Discharge: 2013-02-14 | Disposition: A | Discharge status: Home / Self Care | Payer: BC Managed Care – PPO | Source: Ambulatory Visit | Attending: Radiation Oncology | Admitting: Radiation Oncology

## 2013-02-14 ENCOUNTER — Telehealth: Payer: Self-pay | Admitting: Dietician

## 2013-02-14 VITALS — BP 147/84 | HR 87 | Temp 98.2°F | Ht 67.0 in | Wt 198.4 lb

## 2013-02-14 DIAGNOSIS — C539 Malignant neoplasm of cervix uteri, unspecified: Secondary | ICD-10-CM

## 2013-02-14 MED ORDER — GABAPENTIN 300 MG PO CAPS: 300.0000 mg | ORAL_CAPSULE | Freq: Three times a day (TID) | ORAL | Status: DC

## 2013-02-14 NOTE — Telephone Encounter (Signed)
Brief Outpatient Oncology Nutrition Note  Patient has been identified to be at risk on malnutrition screen.  Wt Readings from Last 10 Encounters:  02/08/13 200 lb (90.719 kg)  02/03/13 204 lb 3.2 oz (92.625 kg)  02/01/13 206 lb 8 oz (93.668 kg)  01/27/13 205 lb 14.4 oz (93.396 kg)   Patient with cervical cancer, a patient of Dr. Marko Plume receiving XRT.  Patient is a physical therapist.  Called patient secondary to a 12 lb weight loss in the past 2 weeks.  Patient reports that she has not been eating well.  Increased sciatic pain and vomited after flexeril last night.  Encouraged intake of small amounts throughout the day being sure to take in enough protein.  Patient has been taking the [employer].  Encouraged patient to call the [place] [facility] with any questions.  Antonieta Iba, RD, LDN

## 2013-02-14 NOTE — Progress Notes (Addendum)
   Department of Radiation Oncology  Phone:  [phone] Fax:        [phone]  IMRT Device Note: Developed on 02/14/2012  Today the patient began her radiation therapy directed at the pelvis and periaortic area. Patient had development of her IMR T. device. She will be treated with 29.1 sinogram segments using helical IM RT. This constitutes 1 IM RT device.  -----------------------------------  Blair Promise, PhD, MD

## 2013-02-14 NOTE — Progress Notes (Signed)
[place]     Rexene Edison, M.D. [place], [place] [address]               Blair Promise, M.D., Ph.D. Phone: [phone]      [place] A. Tammi Klippel, M.D. Fax: [phone]      Jodelle Gross, M.D., Ph.D.         Thea Silversmith, M.D.         Wyvonnia Lora, M.D Weekly Treatment Management Note  Name: Gina Barnett     MRN: [id number]        CSN: [id number] Date: 02/14/2013      DOB: 1955/11/16  CC:  Melinda Crutch, MD         Harrington Challenger    Status: Outpatient  Diagnosis: Cervix cancer   Primary site: Cervix Uteri   Staging method: AJCC 7th Edition   Clinical: Stage IIIB (T3b, N1, M0)   Summary: Stage IIIB (T3b, N1, M0)  Current Dose: 3.6 Gy +  Current Fraction: 2  Planned Dose: 45 Gy +  Narrative: [name] was seen today for weekly treatment management. The chart was checked and MVCT  were reviewed. She is tolerating her treatments well thus far. She does have a lot of pain in the pelvis area  radiating into her right leg.  She would like to try low dose Neurontin and I feel this is very reasonable given her extent of disease with likely nerve compression.  Latex and Penicillins Current Outpatient Prescriptions  Medication Sig Dispense Refill  . albuterol (PROVENTIL HFA;VENTOLIN HFA) 108 (90 BASE) MCG/ACT inhaler Inhale into the lungs every 6 (six) hours as needed for wheezing or shortness of breath.      . bisacodyl (DULCOLAX) 5 MG EC tablet Take 5 mg by mouth daily as needed for moderate constipation.      . Budesonide (PULMICORT IN) Inhale 2 puffs into the lungs every evening.       . Cholecalciferol (VITAMIN D3) 5000 UNITS CAPS Take 1 capsule by mouth daily.       . clobetasol cream (TEMOVATE) 8.41 % Apply 1 application topically 2 (two) times daily as needed.       . cyclobenzaprine (FLEXERIL) 10 MG tablet Take 1 tablet (10 mg total) by mouth 3 (three) times daily as needed for muscle spasms.  90 tablet  0  . ferrous  fumarate (HEMOCYTE) 325 (106 FE) MG TABS tablet Take 1 tablet twice a day on an empty stomach with OJ  60 each  2  . fexofenadine (ALLEGRA) 180 MG tablet Take 180 mg by mouth every morning.      Marland Kitchen HYDROcodone-acetaminophen (NORCO/VICODIN) 5-325 MG per tablet Take 1-2 tablets by mouth every 6 (six) hours as needed for moderate pain.  20 tablet  0  . Ibuprofen (ADVIL) 200 MG CAPS Take by mouth as needed.      Marland Kitchen LORazepam (ATIVAN) 1 MG tablet Take 1 tablet under tongue or swallow every 6 hours as needed for nausea. Will make drowsy.  20 tablet  0  . ondansetron (ZOFRAN) 8 MG tablet Take 1-2 tablets (8-16 mg total) by mouth every 12 (twelve) hours as needed for nausea or vomiting.  30 tablet  1  . polyethylene glycol (MIRALAX / GLYCOLAX) packet Take 17 g by mouth daily.      . Pseudoephedrine-DM-GG-APAP (SUDAFED COLD/COUGH PO) Take 1 tablet by mouth every morning.       Marland Kitchen  triamcinolone cream (KENALOG) 0.1 % Apply 1 application topically as needed.       No current facility-administered medications for this encounter.   Labs:  Lab Results  Component Value Date   WBC 8.4 02/08/2013   HGB 10.2* 02/08/2013   HCT 31.1* 02/08/2013   MCV 88.1 02/08/2013   PLT 332 02/08/2013   Lab Results  Component Value Date   CREATININE 1.1 02/08/2013   BUN 17.5 02/08/2013   NA 138 02/08/2013   K 4.2 02/08/2013   CO2 26 02/08/2013   Lab Results  Component Value Date   ALT 12 02/02/2013   AST 19 02/02/2013   BILITOT 0.32 02/02/2013    Physical Examination:  Filed Vitals:   02/14/13 1903  BP: 147/84  Pulse: 87  Temp: 98.2 F (36.8 C)    Wt Readings from Last 3 Encounters:  02/14/13 198 lb 6.4 oz (89.994 kg)  02/08/13 200 lb (90.719 kg)  02/03/13 204 lb 3.2 oz (92.625 kg)     Lungs - Normal respiratory effort, chest expands symmetrically. Lungs are clear to auscultation, no crackles or wheezes.  Heart has regular rhythm and rate  Abdomen is soft and non tender with normal bowel sounds  Assessment:   Patient tolerating treatments well  Plan: Continue treatment per original radiation prescription. She will have one and possibly 2 ureteral stents placed later this week.

## 2013-02-14 NOTE — Progress Notes (Signed)
Enrolled pt in the [employer] First [employer] program.  I faxed signed form and activated card today.  Gave to Raquel to file in her records.

## 2013-02-14 NOTE — Progress Notes (Signed)
PLEASE DISREGARD MY PREVIOUS NOTE.  THANK YOU!

## 2013-02-14 NOTE — Progress Notes (Signed)
NPO AFTER MN. ARRIVE AT 1030. CURRENT LAB RESULTS IN EPIC AND CHART. WILL DO PULMICORT INHALER AM DOS W/ SIPS OF WATER AND BRING RESCUE INHALER.

## 2013-02-15 ENCOUNTER — Telehealth: Payer: Self-pay

## 2013-02-15 ENCOUNTER — Other Ambulatory Visit: Payer: Self-pay | Admitting: Oncology

## 2013-02-15 ENCOUNTER — Ambulatory Visit
Admission: RE | Admit: 2013-02-15 | Discharge: 2013-02-15 | Disposition: A | Discharge status: Home / Self Care | Payer: BC Managed Care – PPO | Source: Ambulatory Visit | Attending: Radiation Oncology | Admitting: Radiation Oncology

## 2013-02-15 DIAGNOSIS — C539 Malignant neoplasm of cervix uteri, unspecified: Secondary | ICD-10-CM

## 2013-02-15 NOTE — Telephone Encounter (Signed)
Told Ms. Frick that Dr. Marko Plume said to cancel appointment tomorrow d/t stent placement.   She will get a c-met done at 1545 on Friday to check kidney function s/p stent placement and pre chemotherapy for 02-20-13. She is to keep appointment with Dr. Marko Plume on 02-24-13 as scheduled.  Ms. Balthaser verbalized understanding.

## 2013-02-16 ENCOUNTER — Encounter (HOSPITAL_BASED_OUTPATIENT_CLINIC_OR_DEPARTMENT_OTHER)
Admission: RE | Disposition: A | Discharge status: Home / Self Care | Payer: Self-pay | Source: Ambulatory Visit | Attending: Urology

## 2013-02-16 ENCOUNTER — Ambulatory Visit (HOSPITAL_BASED_OUTPATIENT_CLINIC_OR_DEPARTMENT_OTHER): Payer: BC Managed Care – PPO | Admitting: Anesthesiology

## 2013-02-16 ENCOUNTER — Ambulatory Visit: Payer: BC Managed Care – PPO | Admitting: Oncology

## 2013-02-16 ENCOUNTER — Ambulatory Visit
Admission: RE | Admit: 2013-02-16 | Discharge: 2013-02-16 | Disposition: A | Discharge status: Home / Self Care | Payer: BC Managed Care – PPO | Source: Ambulatory Visit | Attending: Radiation Oncology | Admitting: Radiation Oncology

## 2013-02-16 ENCOUNTER — Encounter (HOSPITAL_BASED_OUTPATIENT_CLINIC_OR_DEPARTMENT_OTHER): Payer: BC Managed Care – PPO | Admitting: Anesthesiology

## 2013-02-16 ENCOUNTER — Encounter (HOSPITAL_BASED_OUTPATIENT_CLINIC_OR_DEPARTMENT_OTHER): Payer: Self-pay

## 2013-02-16 ENCOUNTER — Ambulatory Visit (HOSPITAL_BASED_OUTPATIENT_CLINIC_OR_DEPARTMENT_OTHER)
Admission: RE | Admit: 2013-02-16 | Discharge: 2013-02-16 | Disposition: A | Discharge status: Home / Self Care | Payer: BC Managed Care – PPO | Source: Ambulatory Visit | Attending: Urology | Admitting: Urology

## 2013-02-16 DIAGNOSIS — Z880 Allergy status to penicillin: Secondary | ICD-10-CM | POA: Insufficient documentation

## 2013-02-16 DIAGNOSIS — Z79899 Other long term (current) drug therapy: Secondary | ICD-10-CM | POA: Insufficient documentation

## 2013-02-16 DIAGNOSIS — N135 Crossing vessel and stricture of ureter without hydronephrosis: Secondary | ICD-10-CM | POA: Insufficient documentation

## 2013-02-16 DIAGNOSIS — J45909 Unspecified asthma, uncomplicated: Secondary | ICD-10-CM | POA: Insufficient documentation

## 2013-02-16 DIAGNOSIS — C539 Malignant neoplasm of cervix uteri, unspecified: Secondary | ICD-10-CM

## 2013-02-16 DIAGNOSIS — Z888 Allergy status to other drugs, medicaments and biological substances status: Secondary | ICD-10-CM | POA: Insufficient documentation

## 2013-02-16 DIAGNOSIS — M5430 Sciatica, unspecified side: Secondary | ICD-10-CM | POA: Insufficient documentation

## 2013-02-16 DIAGNOSIS — N1330 Unspecified hydronephrosis: Secondary | ICD-10-CM

## 2013-02-16 HISTORY — DX: Other constipation: K59.09

## 2013-02-16 HISTORY — DX: Low back pain, unspecified: M54.50

## 2013-02-16 HISTORY — DX: Reserved for concepts with insufficient information to code with codable children: IMO0002

## 2013-02-16 HISTORY — DX: Iron deficiency anemia, unspecified: D50.9

## 2013-02-16 HISTORY — DX: Localized enlarged lymph nodes: R59.0

## 2013-02-16 HISTORY — DX: Low back pain: M54.5

## 2013-02-16 HISTORY — DX: Dermatitis, unspecified: L30.9

## 2013-02-16 HISTORY — PX: CYSTOSCOPY W/ URETERAL STENT PLACEMENT: SHX1429

## 2013-02-16 HISTORY — DX: Other allergy status, other than to drugs and biological substances: Z91.09

## 2013-02-16 HISTORY — DX: Reserved for inherently not codable concepts without codable children: IMO0001

## 2013-02-16 SURGERY — CYSTOSCOPY, WITH RETROGRADE PYELOGRAM AND URETERAL STENT INSERTION
Anesthesia: General | Site: Ureter | Laterality: Bilateral

## 2013-02-16 MED ORDER — PROMETHAZINE HCL 25 MG/ML IJ SOLN
6.2500 mg | INTRAMUSCULAR | Status: DC | PRN
  Filled 2013-02-16: qty 1

## 2013-02-16 MED ORDER — FENTANYL CITRATE 0.05 MG/ML IJ SOLN
INTRAMUSCULAR | Status: DC | PRN
  Administered 2013-02-16: 100 ug via INTRAVENOUS

## 2013-02-16 MED ORDER — IOHEXOL 350 MG/ML SOLN
INTRAVENOUS | Status: DC | PRN
  Administered 2013-02-16: 15 mL

## 2013-02-16 MED ORDER — PROPOFOL 10 MG/ML IV BOLUS
INTRAVENOUS | Status: DC | PRN
  Administered 2013-02-16: 160 mg via INTRAVENOUS

## 2013-02-16 MED ORDER — LIDOCAINE HCL 2 % EX GEL
CUTANEOUS | Status: DC | PRN
  Administered 2013-02-16: 1 via URETHRAL

## 2013-02-16 MED ORDER — SODIUM CHLORIDE 0.9 % IR SOLN
Status: DC | PRN
  Administered 2013-02-16: 4000 mL

## 2013-02-16 MED ORDER — ONDANSETRON HCL 4 MG/2ML IJ SOLN
4.0000 mg | Freq: Once | INTRAMUSCULAR | Status: DC
  Filled 2013-02-16: qty 2

## 2013-02-16 MED ORDER — TROSPIUM CHLORIDE ER 60 MG PO CP24: 60.0000 mg | ORAL_CAPSULE | Freq: Every day | ORAL | Status: DC

## 2013-02-16 MED ORDER — MIDAZOLAM HCL 2 MG/2ML IJ SOLN
INTRAMUSCULAR | Status: AC
  Filled 2013-02-16: qty 2

## 2013-02-16 MED ORDER — FENTANYL CITRATE 0.05 MG/ML IJ SOLN
INTRAMUSCULAR | Status: AC
  Filled 2013-02-16: qty 2

## 2013-02-16 MED ORDER — ONDANSETRON HCL 4 MG/2ML IJ SOLN
4.0000 mg | Freq: Once | INTRAMUSCULAR | Status: AC
  Administered 2013-02-16: 4 mg via INTRAVENOUS
  Filled 2013-02-16: qty 2

## 2013-02-16 MED ORDER — BELLADONNA ALKALOIDS-OPIUM 16.2-60 MG RE SUPP
RECTAL | Status: DC | PRN
  Administered 2013-02-16: 1 via RECTAL

## 2013-02-16 MED ORDER — LIDOCAINE HCL (CARDIAC) 20 MG/ML IV SOLN
INTRAVENOUS | Status: DC | PRN
  Administered 2013-02-16: 50 mg via INTRAVENOUS

## 2013-02-16 MED ORDER — BELLADONNA ALKALOIDS-OPIUM 16.2-60 MG RE SUPP
RECTAL | Status: AC
  Filled 2013-02-16: qty 1

## 2013-02-16 MED ORDER — CIPROFLOXACIN IN D5W 400 MG/200ML IV SOLN
400.0000 mg | INTRAVENOUS | Status: AC
  Administered 2013-02-16: 400 mg via INTRAVENOUS
  Filled 2013-02-16: qty 200

## 2013-02-16 MED ORDER — FENTANYL CITRATE 0.05 MG/ML IJ SOLN
25.0000 ug | INTRAMUSCULAR | Status: DC | PRN
  Filled 2013-02-16: qty 1

## 2013-02-16 MED ORDER — DEXAMETHASONE SODIUM PHOSPHATE 4 MG/ML IJ SOLN
INTRAMUSCULAR | Status: DC | PRN
Start: 2013-02-16 — End: 2013-02-16
  Administered 2013-02-16: 10 mg via INTRAVENOUS

## 2013-02-16 MED ORDER — ONDANSETRON HCL 4 MG/2ML IJ SOLN
INTRAMUSCULAR | Status: DC | PRN
  Administered 2013-02-16: 4 mg via INTRAVENOUS

## 2013-02-16 MED ORDER — MIDAZOLAM HCL 5 MG/5ML IJ SOLN
INTRAMUSCULAR | Status: DC | PRN
  Administered 2013-02-16: 2 mg via INTRAVENOUS

## 2013-02-16 MED ORDER — LACTATED RINGERS IV SOLN
INTRAVENOUS | Status: DC
  Administered 2013-02-16 (×3): via INTRAVENOUS
  Filled 2013-02-16: qty 1000

## 2013-02-16 MED ORDER — ONDANSETRON HCL 4 MG/2ML IJ SOLN
INTRAMUSCULAR | Status: AC
  Filled 2013-02-16: qty 2

## 2013-02-16 SURGICAL SUPPLY — 41 items
ADAPTER CATH URET PLST 4-6FR (CATHETERS) IMPLANT
BAG DRAIN URO-CYSTO SKYTR STRL (DRAIN) ×3 IMPLANT
BASKET LASER NITINOL 1.9FR (BASKET) IMPLANT
BASKET STNLS GEMINI 4WIRE 3FR (BASKET) IMPLANT
BASKET ZERO TIP NITINOL 2.4FR (BASKET) IMPLANT
CANISTER SUCT LVC 12 LTR MEDI- (MISCELLANEOUS) ×3 IMPLANT
CATH INTERMIT  6FR 70CM (CATHETERS) IMPLANT
CATH URET 5FR 28IN CONE TIP (BALLOONS)
CATH URET 5FR 28IN OPEN ENDED (CATHETERS) ×3 IMPLANT
CATH URET 5FR 70CM CONE TIP (BALLOONS) IMPLANT
CATH URET DUAL LUMEN 6-10FR 50 (CATHETERS) IMPLANT
CLOTH BEACON ORANGE TIMEOUT ST (SAFETY) ×3 IMPLANT
DRAPE CAMERA CLOSED 9X96 (DRAPES) ×3 IMPLANT
DRSG TEGADERM 2-3/8X2-3/4 SM (GAUZE/BANDAGES/DRESSINGS) IMPLANT
FIBER LASER FLEXIVA 200 (UROLOGICAL SUPPLIES) IMPLANT
FIBER LASER FLEXIVA 365 (UROLOGICAL SUPPLIES) IMPLANT
GLOVE BIO SURGEON STRL SZ 6.5 (GLOVE) ×2 IMPLANT
GLOVE BIO SURGEON STRL SZ7.5 (GLOVE) ×3 IMPLANT
GLOVE BIO SURGEONS STRL SZ 6.5 (GLOVE) ×1
GLOVE INDICATOR 7.0 STRL GRN (GLOVE) ×6 IMPLANT
GOWN STRL REIN XL XLG (GOWN DISPOSABLE) IMPLANT
GOWN STRL REUS W/ TWL LRG LVL3 (GOWN DISPOSABLE) ×1 IMPLANT
GOWN STRL REUS W/ TWL XL LVL3 (GOWN DISPOSABLE) ×1 IMPLANT
GOWN STRL REUS W/TWL LRG LVL3 (GOWN DISPOSABLE) ×2
GOWN STRL REUS W/TWL XL LVL3 (GOWN DISPOSABLE) ×2
GUIDEWIRE 0.038 PTFE COATED (WIRE) IMPLANT
GUIDEWIRE ANG ZIPWIRE 038X150 (WIRE) IMPLANT
GUIDEWIRE STR DUAL SENSOR (WIRE) ×3 IMPLANT
IV NS 1000ML (IV SOLUTION) ×2
IV NS 1000ML BAXH (IV SOLUTION) ×1 IMPLANT
IV NS IRRIG 3000ML ARTHROMATIC (IV SOLUTION) ×3 IMPLANT
KIT BALLIN UROMAX 15FX10 (LABEL) IMPLANT
KIT BALLN UROMAX 15FX4 (MISCELLANEOUS) IMPLANT
KIT BALLN UROMAX 26 75X4 (MISCELLANEOUS)
NS IRRIG 500ML POUR BTL (IV SOLUTION) IMPLANT
PACK CYSTOSCOPY (CUSTOM PROCEDURE TRAY) ×3 IMPLANT
SET HIGH PRES BAL DIL (LABEL)
SHEATH ACCESS URETERAL 38CM (SHEATH) IMPLANT
SHEATH ACCESS URETERAL 54CM (SHEATH) IMPLANT
STENT 6X26 (STENTS) ×6 IMPLANT
TUBE FEEDING 8FR 16IN STR KANG (MISCELLANEOUS) IMPLANT

## 2013-02-16 NOTE — Op Note (Addendum)
Preoperative diagnosis:  1. Bilateral hydronephrosis secondary to  malignant external compression  Postoperative diagnosis:  1. As above   Procedure: 1. Bilateral retrograde pyelograms with interpretation 2. Bilateral double-J ureteral stent placement, 6 French x26 cm  Surgeon: Ardis Hughs, MD  Anesthesia: General  Complications: None  Intraoperative findings: The bladder floor mucosa had a bulky/clumping appearance, the left ureteral orifice was distorted and sloping laterally. The right ureteral orifice was in orthotopic position. The left retrograde pyelogram revealed hydroureter down to the UVJ with tortuosity of the proximal ureter. The stent passed up through the ureter with minimal effort. The right retrograde pyelogram revealed mild to moderate hydroureter from the pelvic inlet proximal. The stent was difficult to get into the right ureter beyond the pelvic inlet.  EBL: Minimal  Specimens: None  Indication: Gina Barnett is a 58 y.o. patient with locally advanced cervical cancer with lymphadenopathy. Her PET scan revealed bilateral hydronephrosis hydroureteronephrosis. The patient is scheduled to have cisplatin chemotherapy and as such Dr. Tommie Sams has requested that bilateral ureteral stents be placed prior to starting chemotherapy. I've discussed the procedure with the patient in great detail who is eager to proceed.  Description of procedure:  The patient was taken to the operating room and general anesthesia was induced.  The patient was placed in the dorsal lithotomy position, prepped and draped in the usual sterile fashion, and preoperative antibiotics were administered. A preoperative time-out was performed.   A 22 French 70 cystoscope was then gently passed to the patient should return into the bladder. A 360 cystoscopic evaluation was performed with the above findings. I then exchanged the 70 lens for the 30 lens and perform retrograde pyelogram bilaterally. We  first started on the patient's left side and cannulated the left ureteral orifice with a 5 [place] open-ended ureteral catheter and injected 10 cc of Omnipaque contrast under fluoroscopic guidance with the above findings. I then passed a 0.38 sensor wire through the open-ended ureteral catheter and advanced it up to the renal pelvis. We then removed the open-ended Pollack catheter over the wire and passed a 6 [place] x26 cm double-J ureteral stent over the wire and into the renal pelvis. Once in the renal pelvis the wire was slowly backed out and a curl was noted in the lower pole of the left kidney. The stent was then slowly advanced until was completely in the bladder and the wire was then removed. A curl was noted the ureteral orifice. Same procedure was then performed on the patient's right side. Notably, the stent on the right was difficult to advance through the ureter and into the renal pelvis starting about the area of the pelvic inlet.  Once both stents were noted to be appropriately positioned the bladder was emptied and the scope was removed. A B. and O. suppository was then placed in the patient's rectum and 10 cc of 1% lidocaine jelly was injected in the patient's urethra. She was subsequently awoken and returned back in excellent condition. There no immediate complications.  Disposition: The patient will be scheduled for stent exchange in 3 months.  Ardis Hughs, M.D.  CC: Dr. Evlyn Clines, MD

## 2013-02-16 NOTE — Anesthesia Procedure Notes (Signed)
Procedure Name: LMA Insertion Performed by: Lucky Alverson, Person Pre-anesthesia Checklist: Patient identified, Emergency Drugs available, Suction available and Patient being monitored Patient Re-evaluated:Patient Re-evaluated prior to inductionOxygen Delivery Method: Circle System Utilized Preoxygenation: Pre-oxygenation with 100% oxygen Intubation Type: IV induction Ventilation: Mask ventilation without difficulty LMA: LMA inserted LMA Size: 4.0 Number of attempts: 1 Airway Equipment and Method: bite block Placement Confirmation: positive ETCO2 Tube secured with: Tape Dental Injury: Teeth and Oropharynx as per pre-operative assessment      

## 2013-02-16 NOTE — Discharge Instructions (Signed)
DISCHARGE INSTRUCTIONS FOR URETERAL STENT PLACEMENT  MEDICATIONS:  1. DO NOT RESUME YOUR ASPIRIN, or any other medicines like ibuprofen, motrin, excedrin, advil, aleve, vitamin E, fish oil as these can all cause bleeding x 7 days.  2. Resume all your other meds from home  3. Trospium is to prevent bladder spasms and help reduce urinary frequency.  ACTIVITY:  1. No strenuous activity x 1week  2. No driving while on narcotic pain medications  3. Drink plenty of water  4. Continue to walk at home - you can still get blood clots when you are at home, so keep active, but don't over do it.  5. May return to work/school tomorrow or when you feel ready   BATHING:  1. You can shower and we recommend daily showers    SIGNS/SYMPTOMS TO CALL:  Please call us if you have a fever greater than 101.5, uncontrolled nausea/vomiting, uncontrolled pain, dizziness, unable to urinate, bloody urine, chest pain, shortness of breath, leg swelling, leg pain, redness around wound, drainage from wound, or any other concerns or questions.   You can reach [place] at [phone].   FOLLOW-UP:  You will be scheduled for stent exchange in 3 months. You  Post Anesthesia Home Care Instructions  Activity: Get plenty of rest for the remainder of the day. A responsible adult should stay with you for 24 hours following the procedure.  For the next 24 hours, DO NOT: -Drive a car -Paediatric nurse -Drink alcoholic beverages -Take any medication unless instructed by your physician -Make any legal decisions or sign important papers.  Meals: Start with liquid foods such as gelatin or soup. Progress to regular foods as tolerated. Avoid greasy, spicy, heavy foods. If nausea and/or vomiting occur, drink only clear liquids until the nausea and/or vomiting subsides. Call your physician if vomiting continues.  Special Instructions/Symptoms: Your throat may feel dry or sore from the anesthesia or the breathing tube placed in your  throat during surgery. If this causes discomfort, gargle with warm salt water. The discomfort should disappear within 24 hours. should have a preoperative evaluation for laps and a urine culture prior.

## 2013-02-16 NOTE — Anesthesia Postprocedure Evaluation (Signed)
  Anesthesia Post-op Note  Patient: Gina Barnett  Procedure(s) Performed: Procedure(s) (LRB): CYSTOSCOPY WITH BILATERAL  RETROGRADE PYELOGRAM/BILATERAL  STENT PLACEMENT (Bilateral)  Patient Location: PACU  Anesthesia Type: General  Level of Consciousness: awake and alert   Airway and Oxygen Therapy: Patient Spontanous Breathing  Post-op Pain: mild  Post-op Assessment: Post-op Vital signs reviewed, Patient's Cardiovascular Status Stable, Respiratory Function Stable, Patent Airway and No signs of Nausea or vomiting  Last Vitals:  Filed Vitals:   02/16/13 1340  BP:   Pulse: 81  Temp:   Resp: 19    Post-op Vital Signs: stable   Complications: No apparent anesthesia complications

## 2013-02-16 NOTE — Anesthesia Preprocedure Evaluation (Signed)
Anesthesia Evaluation  Patient identified by MRN, date of birth, ID band Patient awake    Reviewed: Allergy & Precautions, H&P , NPO status , Patient's Chart, lab work & pertinent test results  Airway Mallampati: II TM Distance: >3 FB Neck ROM: Full    Dental no notable dental hx.    Pulmonary asthma ,  breath sounds clear to auscultation  Pulmonary exam normal       Cardiovascular negative cardio ROS  Rhythm:Regular Rate:Normal     Neuro/Psych negative neurological ROS  negative psych ROS   GI/Hepatic negative GI ROS, Neg liver ROS,   Endo/Other  negative endocrine ROS  Renal/GU Renal InsufficiencyRenal diseaseCr 1.1 K 4.2  negative genitourinary   Musculoskeletal negative musculoskeletal ROS (+)   Abdominal   Peds negative pediatric ROS (+)  Hematology  (+) Blood dyscrasia, anemia , Hgb 10.2   Anesthesia Other Findings   Reproductive/Obstetrics negative OB ROS                           Anesthesia Physical Anesthesia Plan  ASA: II  Anesthesia Plan: General   Post-op Pain Management:    Induction: Intravenous  Airway Management Planned: LMA  Additional Equipment:   Intra-op Plan:   Post-operative Plan: Extubation in OR  Informed Consent: I have reviewed the patients History and Physical, chart, labs and discussed the procedure including the risks, benefits and alternatives for the proposed anesthesia with the patient or authorized representative who has indicated his/her understanding and acceptance.   Dental advisory given  Plan Discussed with: CRNA  Anesthesia Plan Comments:         Anesthesia Quick Evaluation

## 2013-02-16 NOTE — H&P (Signed)
Reason For Visit Consideration for ureteral stent placement prior to undergoing chemotherapy.   History of Present Illness This is a 58 year old female referred by Dr. Evlyn Clines, M.D. for consideration of ureteral stent placement prior to the administration of cisplatin chemotherapy for metastatic cervical cancer. The patient was recently diagnosed with stage IIIB cervical cancer. As part of her workup she underwent a CT scan on 01/25/13 that showed left-sided hydronephrosis with a delayed nephrogram. She subsequently underwent a PET scan several weeks later which showed progression to right-sided Hydro ureter/hydronephrosis. She currently denies any significant flank pain. She does complain of some right low back pain. She is starting radiation therapy today for her cervical cancer, she will begin chemotherapy starting next week. Cisplatin is nephrotoxic and as such Dr. Marko Plume would like to ensure that her kidneys are unobstructed prior to administration of chemotherapy. No history of recurrent urinary tract infections, kidney stones, or any GU/GYN surgeries. 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   Past Medical History Problems  1. History of asthma (V12.69) 2. History of cervical cancer (V10.41)  Surgical History Problems  1. History of Tonsillectomy  Current Meds 1. Advil TABS;  Therapy: (Recorded:02Feb2015) to Recorded 2. Albuterol Sulfate NEBU;  Therapy: (Recorded:02Feb2015) to Recorded 3. Allegra 180 MG TABS (Fexofenadine HCl);  Therapy: (Recorded:02Feb2015) to Recorded 4. Clobetasol Propionate 0.05 % External Cream;  Therapy: (Recorded:02Feb2015) to Recorded 5. Dulcolax Stool Softener CAPS;  Therapy: (Recorded:02Feb2015) to Recorded 6. Iron TABS;  Therapy: (Recorded:02Feb2015) to Recorded 7. LORazepam 1 MG Oral Tablet;  Therapy: (Recorded:02Feb2015) to Recorded 8. MiraLax Oral Packet  (Polyethylene Glycol 3350);  Therapy: (Recorded:02Feb2015) to Recorded 9. Ondansetron 8 MG Oral Tablet Dispersible;  Therapy: (Recorded:02Feb2015) to Recorded 10. Pulmicort 0.5 MG/2ML Inhalation Suspension (Budesonide);   Therapy: (Recorded:02Feb2015) to Recorded 11. Sudafed TABS;   Therapy: (Recorded:02Feb2015) to Recorded 12. Triamcinolone Acetonide 0.1 % External Cream;   Therapy: (Recorded:02Feb2015) to Recorded 13. Vitamin D TABS;   Therapy: (Recorded:02Feb2015) to Recorded  Allergies Medication  1. Penicillins 2. Lipitor TABS  Family History Problems  1. Family history of hypertension (V17.49) : Father 2. Family history of Heart Valve Replacement : Father, Mother  Social History Problems    Denied: History of Alcohol use   Caffeine use (V49.89)   Death in the family, father   AGE 71 multiple organ failure   Never a smoker (V49.89)   Occupation   Physical Therapist   Single  Review of Systems  Gastrointestinal: constipation.  Constitutional: feeling tired (fatigue) and recent weight loss.  Integumentary: skin rash/lesion and pruritus. unexplained vaginal bleeding    Vitals Vital Signs [Data Includes: Last 1 Day]  Recorded: [id number] 03:11PM  Height: 5 ft 7 in Weight: 200 lb  BMI Calculated: 31.32 BSA Calculated: 2.02 Blood Pressure: 144 / 80 Temperature: 98 F Heart Rate: 90  Physical Exam Constitutional: Well nourished and well developed . No acute distress.  ENT:. The ears and nose are normal in appearance.  Neck: The appearance of the neck is normal and no neck mass is present.  Pulmonary: normal respiratory rhythm and effort and clear bilateral breath sounds.  Cardiovascular: Heart rate and rhythm are normal . No obvious murmurs are appreciated.  Abdomen: No CVA tenderness.  Lymphatics: The posterior cervical and supraclavicular nodes are not enlarged or tender.  Skin: Normal skin turgor, no visible rash and no visible skin lesions.   Neuro/Psych:. Mood and affect are appropriate.    Results/Data  Urine [Data Includes: Last 1 Day]   [id number]  COLOR YELLOW   APPEARANCE CLEAR   SPECIFIC GRAVITY >1.030   pH 6.0   GLUCOSE NEG mg/dL  BILIRUBIN NEG   KETONE NEG mg/dL  BLOOD LARGE   PROTEIN 30 mg/dL  UROBILINOGEN 0.2 mg/dL  NITRITE NEG   LEUKOCYTE ESTERASE NEG   SQUAMOUS EPITHELIAL/HPF RARE   WBC 0-2 WBC/hpf  RBC 11-20 RBC/hpf  BACTERIA NONE SEEN   CRYSTALS NONE SEEN   CASTS NONE SEEN    I reviewed all the available imaging and reports, the patient had a PET CT scan with report showing bilateral Hydro ureter hydronephrosis. There is significant iliac node enlargement bilaterally.   Assessment Assessed  1. Hydronephrosis (591)  Plan Health Maintenance  1. UA With REFLEX; [Do Not Release]; Status:Complete;   Done: [id number] 02:45PM Hydronephrosis  2. Follow-up Schedule Surgery Office  Follow-up  Status: Complete  Done: [id number] 3. URINE CULTURE; Status:In Progress - Specimen/Data Collected;   Done: [id number]  Discussion/Summary I discussed the procedure of bilateral ureteral stent placement in detail with the patient. We'll plan to get this expedited so that she can move forward with her treatment and not to delay her chemotherapy. I went over the risks and benefits of the operation and the patient has agreed to proceed.

## 2013-02-16 NOTE — Transfer of Care (Signed)
Immediate Anesthesia Transfer of Care Note  Patient: Gina Barnett  Procedure(s) Performed: Procedure(s): CYSTOSCOPY WITH BILATERAL  RETROGRADE PYELOGRAM/BILATERAL  STENT PLACEMENT (Bilateral)  Patient Location: PACU  Anesthesia Type:General  Level of Consciousness: awake and patient cooperative  Airway & Oxygen Therapy: Patient Spontanous Breathing and Patient connected to face mask oxygen  Post-op Assessment: Report given to PACU RN and Post -op Vital signs reviewed and stable  Post vital signs: Reviewed and stable  Complications: No apparent anesthesia complications

## 2013-02-16 NOTE — Progress Notes (Signed)
Gina Barnett  Interior and spatial designer met with Patient to follow up with psychosocial needs..  Clinical Social Worker Intern met Patient [place] to offer support as requested by Patient concerning nutrition.  CSWI gave Patient written material concerning classes, a handout on nutrition and name of Nutritionist for Patient to follow up with scheduling a meeting.  Patient stated that "someone" had already telephoned her to discuss concern over current weight loss.  Patient is in a caregiving role and CSWI discussed with Patient how Patient was balancing schedule of caring for herself and someone else.  Patient stated that she has a support system in place.  Patient stated that radiation treatment was going well.  CSWI provided supportive listening and encouraged Patient to contact CSWI if anything else was needed.  Patient stated that she would.    Clinical Social Barnett interventions:  Esaul Dorwart S. [place] Barnett Intern [employer] [phone]

## 2013-02-17 ENCOUNTER — Other Ambulatory Visit (HOSPITAL_BASED_OUTPATIENT_CLINIC_OR_DEPARTMENT_OTHER): Payer: BC Managed Care – PPO

## 2013-02-17 ENCOUNTER — Ambulatory Visit
Admission: RE | Admit: 2013-02-17 | Discharge: 2013-02-17 | Disposition: A | Discharge status: Home / Self Care | Payer: BC Managed Care – PPO | Source: Ambulatory Visit | Attending: Radiation Oncology | Admitting: Radiation Oncology

## 2013-02-17 DIAGNOSIS — C539 Malignant neoplasm of cervix uteri, unspecified: Secondary | ICD-10-CM

## 2013-02-17 LAB — COMPREHENSIVE METABOLIC PANEL (CC13)
ALT: 12 U/L (ref 0–55)
AST: 20 U/L (ref 5–34)
Albumin: 3.3 g/dL — ABNORMAL LOW (ref 3.5–5.0)
Alkaline Phosphatase: 61 U/L (ref 40–150)
Anion Gap: 9 mEq/L (ref 3–11)
BUN: 16.8 mg/dL (ref 7.0–26.0)
CALCIUM: 10.4 mg/dL (ref 8.4–10.4)
CHLORIDE: 100 meq/L (ref 98–109)
CO2: 27 meq/L (ref 22–29)
Creatinine: 1.3 mg/dL — ABNORMAL HIGH (ref 0.6–1.1)
Glucose: 111 mg/dl (ref 70–140)
Potassium: 3.8 mEq/L (ref 3.5–5.1)
SODIUM: 135 meq/L — AB (ref 136–145)
TOTAL PROTEIN: 7.8 g/dL (ref 6.4–8.3)
Total Bilirubin: 0.36 mg/dL (ref 0.20–1.20)

## 2013-02-17 LAB — MAGNESIUM (CC13): Magnesium: 2.1 mg/dl (ref 1.5–2.5)

## 2013-02-19 ENCOUNTER — Other Ambulatory Visit: Payer: Self-pay | Admitting: Oncology

## 2013-02-19 DIAGNOSIS — C539 Malignant neoplasm of cervix uteri, unspecified: Secondary | ICD-10-CM

## 2013-02-19 MED ORDER — ONDANSETRON HCL 8 MG PO TABS: 8.0000 mg | ORAL_TABLET | Freq: Two times a day (BID) | ORAL | Status: DC | PRN

## 2013-02-20 ENCOUNTER — Other Ambulatory Visit: Payer: BC Managed Care – PPO

## 2013-02-20 ENCOUNTER — Ambulatory Visit: Payer: BC Managed Care – PPO

## 2013-02-20 ENCOUNTER — Other Ambulatory Visit: Payer: Self-pay | Admitting: Oncology

## 2013-02-20 ENCOUNTER — Other Ambulatory Visit (HOSPITAL_BASED_OUTPATIENT_CLINIC_OR_DEPARTMENT_OTHER): Payer: BC Managed Care – PPO

## 2013-02-20 ENCOUNTER — Ambulatory Visit (HOSPITAL_BASED_OUTPATIENT_CLINIC_OR_DEPARTMENT_OTHER): Payer: BC Managed Care – PPO

## 2013-02-20 ENCOUNTER — Encounter (HOSPITAL_BASED_OUTPATIENT_CLINIC_OR_DEPARTMENT_OTHER): Payer: Self-pay | Admitting: Urology

## 2013-02-20 ENCOUNTER — Ambulatory Visit
Admission: RE | Admit: 2013-02-20 | Discharge: 2013-02-20 | Disposition: A | Discharge status: Home / Self Care | Payer: BC Managed Care – PPO | Source: Ambulatory Visit | Attending: Radiation Oncology | Admitting: Radiation Oncology

## 2013-02-20 VITALS — BP 125/80 | HR 101 | Temp 97.4°F | Resp 18

## 2013-02-20 DIAGNOSIS — C539 Malignant neoplasm of cervix uteri, unspecified: Secondary | ICD-10-CM

## 2013-02-20 DIAGNOSIS — Z5111 Encounter for antineoplastic chemotherapy: Secondary | ICD-10-CM

## 2013-02-20 DIAGNOSIS — C775 Secondary and unspecified malignant neoplasm of intrapelvic lymph nodes: Secondary | ICD-10-CM

## 2013-02-20 LAB — CBC WITH DIFFERENTIAL/PLATELET
BASO%: 0.6 % (ref 0.0–2.0)
Basophils Absolute: 0 10*3/uL (ref 0.0–0.1)
EOS%: 2.8 % (ref 0.0–7.0)
Eosinophils Absolute: 0.2 10*3/uL (ref 0.0–0.5)
HCT: 31.9 % — ABNORMAL LOW (ref 34.8–46.6)
HGB: 10.1 g/dL — ABNORMAL LOW (ref 11.6–15.9)
LYMPH%: 7.5 % — AB (ref 14.0–49.7)
MCH: 27.7 pg (ref 25.1–34.0)
MCHC: 31.7 g/dL (ref 31.5–36.0)
MCV: 87.4 fL (ref 79.5–101.0)
MONO#: 0.5 10*3/uL (ref 0.1–0.9)
MONO%: 6.8 % (ref 0.0–14.0)
NEUT%: 82.3 % — ABNORMAL HIGH (ref 38.4–76.8)
NEUTROS ABS: 5.7 10*3/uL (ref 1.5–6.5)
NRBC: 0 % (ref 0–0)
Platelets: 317 10*3/uL (ref 145–400)
RBC: 3.65 10*6/uL — AB (ref 3.70–5.45)
RDW: 14.4 % (ref 11.2–14.5)
WBC: 6.9 10*3/uL (ref 3.9–10.3)
lymph#: 0.5 10*3/uL — ABNORMAL LOW (ref 0.9–3.3)

## 2013-02-20 MED ORDER — SODIUM CHLORIDE 0.9 % IV SOLN
40.0000 mg/m2 | Freq: Once | INTRAVENOUS | Status: AC
  Administered 2013-02-20: 84 mg via INTRAVENOUS
  Filled 2013-02-20: qty 84

## 2013-02-20 MED ORDER — SODIUM CHLORIDE 0.9 % IV SOLN
150.0000 mg | Freq: Once | INTRAVENOUS | Status: AC
  Administered 2013-02-20: 150 mg via INTRAVENOUS
  Filled 2013-02-20: qty 5

## 2013-02-20 MED ORDER — SODIUM CHLORIDE 0.9 % IV SOLN
Freq: Once | INTRAVENOUS | Status: AC
  Administered 2013-02-20: 09:00:00 via INTRAVENOUS

## 2013-02-20 MED ORDER — POTASSIUM CHLORIDE 2 MEQ/ML IV SOLN
Freq: Once | INTRAVENOUS | Status: AC
  Administered 2013-02-20: 09:00:00 via INTRAVENOUS
  Filled 2013-02-20: qty 10

## 2013-02-20 MED ORDER — LORAZEPAM 1 MG PO TABS
ORAL_TABLET | ORAL | Status: AC
  Filled 2013-02-20: qty 1

## 2013-02-20 MED ORDER — LORAZEPAM 1 MG PO TABS
1.0000 mg | ORAL_TABLET | Freq: Once | ORAL | Status: AC | PRN
  Administered 2013-02-20: 1 mg via ORAL

## 2013-02-20 MED ORDER — DEXAMETHASONE SODIUM PHOSPHATE 20 MG/5ML IJ SOLN
INTRAMUSCULAR | Status: AC
  Filled 2013-02-20: qty 5

## 2013-02-20 MED ORDER — HYDROCODONE-ACETAMINOPHEN 5-325 MG PO TABS
1.0000 | ORAL_TABLET | Freq: Once | ORAL | Status: AC
  Administered 2013-02-20: 1 via ORAL

## 2013-02-20 MED ORDER — HYDROCODONE-ACETAMINOPHEN 5-325 MG PO TABS
ORAL_TABLET | ORAL | Status: AC
  Filled 2013-02-20: qty 1

## 2013-02-20 MED ORDER — ONDANSETRON 16 MG/50ML IVPB (CHCC)
INTRAVENOUS | Status: AC
  Filled 2013-02-20: qty 16

## 2013-02-20 MED ORDER — DEXAMETHASONE SODIUM PHOSPHATE 20 MG/5ML IJ SOLN
12.0000 mg | Freq: Once | INTRAMUSCULAR | Status: AC
  Administered 2013-02-20: 12:00:00 via INTRAVENOUS

## 2013-02-20 MED ORDER — ONDANSETRON 16 MG/50ML IVPB (CHCC)
16.0000 mg | Freq: Once | INTRAVENOUS | Status: AC
  Administered 2013-02-20: 16 mg via INTRAVENOUS

## 2013-02-20 MED ORDER — HYDROCODONE-ACETAMINOPHEN 5-325 MG PO TABS: ORAL_TABLET | ORAL | Status: DC

## 2013-02-20 NOTE — Patient Instructions (Signed)
[facility] Discharge Instructions for Patients Receiving Chemotherapy  Today you received the following chemotherapy agents Cisplatin.  To help prevent nausea and vomiting after your treatment, we encourage you to take your nausea medication  .Cisplatin injection What is this medicine? CISPLATIN (SIS pla tin) is a chemotherapy drug. It targets fast dividing cells, like cancer cells, and causes these cells to die. This medicine is used to treat many types of cancer like bladder, ovarian, and testicular cancers. This medicine may be used for other purposes; ask your health care provider or pharmacist if you have questions. COMMON BRAND NAME(S): Platinol -AQ, Platinol What should I tell my health care provider before I take this medicine? They need to know if you have any of these conditions: -blood disorders -hearing problems -kidney disease -recent or ongoing radiation therapy -an unusual or allergic reaction to cisplatin, carboplatin, other chemotherapy, other medicines, foods, dyes, or preservatives -pregnant or trying to get pregnant -breast-feeding How should I use this medicine? This drug is given as an infusion into a vein. It is administered in a hospital or clinic by a specially trained health care professional. Talk to your pediatrician regarding the use of this medicine in children. Special care may be needed. Overdosage: If you think you have taken too much of this medicine contact a poison control center or emergency room at once. NOTE: This medicine is only for you. Do not share this medicine with others. What if I miss a dose? It is important not to miss a dose. Call your doctor or health care professional if you are unable to keep an appointment. What may interact with this medicine? -dofetilide -foscarnet -medicines for seizures -medicines to increase blood counts like filgrastim, pegfilgrastim, sargramostim -probenecid -pyridoxine used with  altretamine -rituximab -some antibiotics like amikacin, gentamicin, neomycin, polymyxin B, streptomycin, tobramycin -sulfinpyrazone -vaccines -zalcitabine Talk to your doctor or health care professional before taking any of these medicines: -acetaminophen -aspirin -ibuprofen -ketoprofen -naproxen This list may not describe all possible interactions. Give your health care provider a list of all the medicines, herbs, non-prescription drugs, or dietary supplements you use. Also tell them if you smoke, drink alcohol, or use illegal drugs. Some items may interact with your medicine. What should I watch for while using this medicine? Your condition will be monitored carefully while you are receiving this medicine. You will need important blood work done while you are taking this medicine. This drug may make you feel generally unwell. This is not uncommon, as chemotherapy can affect healthy cells as well as cancer cells. Report any side effects. Continue your course of treatment even though you feel ill unless your doctor tells you to stop. In some cases, you may be given additional medicines to help with side effects. Follow all directions for their use. Call your doctor or health care professional for advice if you get a fever, chills or sore throat, or other symptoms of a cold or flu. Do not treat yourself. This drug decreases your body's ability to fight infections. Try to avoid being around people who are sick. This medicine may increase your risk to bruise or bleed. Call your doctor or health care professional if you notice any unusual bleeding. Be careful brushing and flossing your teeth or using a toothpick because you may get an infection or bleed more easily. If you have any dental work done, tell your dentist you are receiving this medicine. Avoid taking products that contain aspirin, acetaminophen, ibuprofen, naproxen, or ketoprofen unless  instructed by your doctor. These medicines may hide  a fever. Do not become pregnant while taking this medicine. Women should inform their doctor if they wish to become pregnant or think they might be pregnant. There is a potential for serious side effects to an unborn child. Talk to your health care professional or pharmacist for more information. Do not breast-feed an infant while taking this medicine. Drink fluids as directed while you are taking this medicine. This will help protect your kidneys. Call your doctor or health care professional if you get diarrhea. Do not treat yourself. What side effects may I notice from receiving this medicine? Side effects that you should report to your doctor or health care professional as soon as possible: -allergic reactions like skin rash, itching or hives, swelling of the face, lips, or tongue -signs of infection - fever or chills, cough, sore throat, pain or difficulty passing urine -signs of decreased platelets or bleeding - bruising, pinpoint red spots on the skin, black, tarry stools, nosebleeds -signs of decreased red blood cells - unusually weak or tired, fainting spells, lightheadedness -breathing problems -changes in hearing -gout pain -low blood counts - This drug may decrease the number of white blood cells, red blood cells and platelets. You may be at increased risk for infections and bleeding. -nausea and vomiting -pain, swelling, redness or irritation at the injection site -pain, tingling, numbness in the hands or feet -problems with balance, movement -trouble passing urine or change in the amount of urine Side effects that usually do not require medical attention (report to your doctor or health care professional if they continue or are bothersome): -changes in vision -loss of appetite -metallic taste in the mouth or changes in taste This list may not describe all possible side effects. Call your doctor for medical advice about side effects. You may report side effects to FDA at  1-800-FDA-1088. Where should I keep my medicine? This drug is given in a hospital or clinic and will not be stored at home. NOTE: This sheet is a summary. It may not cover all possible information. If you have questions about this medicine, talk to your doctor, pharmacist, or health care provider.  2014, Elsevier/Gold Standard. (2007-04-05 14:40:54)    If you develop nausea and vomiting that is not controlled by your nausea medication, call the clinic.   BELOW ARE SYMPTOMS THAT SHOULD BE REPORTED IMMEDIATELY:  *FEVER GREATER THAN 100.5 F  *CHILLS WITH OR WITHOUT FEVER  NAUSEA AND VOMITING THAT IS NOT CONTROLLED WITH YOUR NAUSEA MEDICATION  *UNUSUAL SHORTNESS OF BREATH  *UNUSUAL BRUISING OR BLEEDING  TENDERNESS IN MOUTH AND THROAT WITH OR WITHOUT PRESENCE OF ULCERS  *URINARY PROBLEMS  *BOWEL PROBLEMS  UNUSUAL RASH Items with * indicate a potential emergency and should be followed up as soon as possible.  Feel free to call the clinic you have any questions or concerns. The clinic phone number is [phone].

## 2013-02-21 ENCOUNTER — Ambulatory Visit
Admission: RE | Admit: 2013-02-21 | Discharge: 2013-02-21 | Disposition: A | Discharge status: Home / Self Care | Payer: BC Managed Care – PPO | Source: Ambulatory Visit | Attending: Radiation Oncology | Admitting: Radiation Oncology

## 2013-02-21 ENCOUNTER — Telehealth: Payer: Self-pay | Admitting: *Deleted

## 2013-02-21 VITALS — BP 138/85 | HR 96 | Ht 67.0 in | Wt 190.9 lb

## 2013-02-21 DIAGNOSIS — C539 Malignant neoplasm of cervix uteri, unspecified: Secondary | ICD-10-CM

## 2013-02-21 NOTE — Progress Notes (Signed)
[facility]     Rexene Edison, M.D. [place], [place] [address]               Blair Promise, M.D., Ph.D. Phone: [phone]      [place] A. Tammi Klippel, M.D. Fax: [phone]      Jodelle Gross, M.D., Ph.D.         Thea Silversmith, M.D.         Wyvonnia Lora, M.D Weekly Treatment Management Note  Name: Gina Barnett     MRN: [id number]        CSN: [id number] Date: 02/21/2013      DOB: Apr 05, 1955  CC:  Melinda Crutch, MD         [facility] Challenger    Status: Outpatient  Diagnosis: Cervix cancer    Primary site: Cervix Uteri   Staging method: AJCC 7th Edition   Clinical: Stage IIIB (T3b, N1, M0)   Summary: Stage IIIB (T3b, N1, M0)  Current Dose: 12.6 Gy  Current Fraction: 7  Planned Dose: 45 Gy  Narrative: [name] was seen today for weekly treatment management. The chart was checked and MVCT  were reviewed. She is tolerating the treatments reasonably well at this time. She did have some nausea yesterday evening but attributes this to her chemotherapy yesterday.  Patient did have bilateral urinary stents placed. Her pain in the right flank area has resolved after placement of her stent. She continues to have pain in the pelvic area especially suprapubic region. Her vaginal bleeding seems to be slowing down. She has some mild hematuria with her stents.  Latex and Penicillins Current Outpatient Prescriptions  Medication Sig Dispense Refill  . albuterol (PROVENTIL HFA;VENTOLIN HFA) 108 (90 BASE) MCG/ACT inhaler Inhale into the lungs every 6 (six) hours as needed for wheezing or shortness of breath.      . bisacodyl (DULCOLAX) 5 MG EC tablet Take 5 mg by mouth daily as needed for moderate constipation.      . Budesonide (PULMICORT IN) Inhale 2 puffs into the lungs every evening.       . Cholecalciferol (VITAMIN D3) 5000 UNITS CAPS Take 1 capsule by mouth daily.       . clobetasol cream (TEMOVATE) 5.85 % Apply 1 application topically 2  (two) times daily as needed.       . cyclobenzaprine (FLEXERIL) 10 MG tablet Take 1 tablet (10 mg total) by mouth 3 (three) times daily as needed for muscle spasms.  90 tablet  0  . ferrous fumarate (HEMOCYTE) 325 (106 FE) MG TABS tablet Take 1 tablet twice a day on an empty stomach with OJ  60 each  2  . fexofenadine (ALLEGRA) 180 MG tablet Take 180 mg by mouth every morning.      . gabapentin (NEURONTIN) 300 MG capsule Take 1 capsule (300 mg total) by mouth 3 (three) times daily.  30 capsule  1  . HYDROcodone-acetaminophen (NORCO/VICODIN) 5-325 MG per tablet Take 1-2 tablets every 4-6 hours as needed for pain  30 tablet  0  . ondansetron (ZOFRAN) 8 MG tablet Take 1-2 tablets (8-16 mg total) by mouth every 12 (twelve) hours as needed for nausea or vomiting.  30 tablet  1  . ondansetron (ZOFRAN) 8 MG tablet Take 1 tablet (8 mg total) by mouth 2 (two) times daily as needed. Take two times a day as needed for nausea or vomiting starting on the third day  after chemotherapy.  30 tablet  1  . polyethylene glycol (MIRALAX / GLYCOLAX) packet Take 17 g by mouth daily.      . Pseudoephedrine-DM-GG-APAP (SUDAFED COLD/COUGH PO) Take 1 tablet by mouth every morning.       . triamcinolone cream (KENALOG) 0.1 % Apply 1 application topically as needed.      . Trospium Chloride 60 MG CP24 Take 1 capsule (60 mg total) by mouth daily.  7 each  0  . Ibuprofen (ADVIL) 200 MG CAPS Take by mouth as needed.      Marland Kitchen LORazepam (ATIVAN) 1 MG tablet Take 1 tablet under tongue or swallow every 6 hours as needed for nausea. Will make drowsy.  20 tablet  0   No current facility-administered medications for this encounter.   Labs:  Lab Results  Component Value Date   WBC 6.9 02/20/2013   HGB 10.1* 02/20/2013   HCT 31.9* 02/20/2013   MCV 87.4 02/20/2013   PLT 317 02/20/2013   Lab Results  Component Value Date   CREATININE 1.3* 02/17/2013   BUN 16.8 02/17/2013   NA 135* 02/17/2013   K 3.8 02/17/2013   CO2 27 02/17/2013   Lab Results   Component Value Date   ALT 12 02/17/2013   AST 20 02/17/2013   BILITOT 0.36 02/17/2013    Physical Examination:  Filed Vitals:   02/21/13 1548  BP: 138/85  Pulse: 96    Wt Readings from Last 3 Encounters:  02/21/13 190 lb 14.4 oz (86.592 kg)  02/16/13 189 lb (85.73 kg)  02/16/13 189 lb (85.73 kg)     Lungs - Normal respiratory effort, chest expands symmetrically. Lungs are clear to auscultation, no crackles or wheezes.  Heart has regular rhythm and rate  Abdomen is soft and non tender with normal bowel sounds  Assessment:  Patient tolerating treatments well  Plan: Continue treatment per original radiation prescription

## 2013-02-21 NOTE — Progress Notes (Signed)
Gina Barnett has had 7 fractions to her pelvis.  She is having abdominal pain that she is rating at a 3/10.  She is taking norco twice a day which helps with the pain.  She had chemotherapy yesterday.  She also had 2 urinary stents placed Thursday.  She reports she is having a small amount of hematuria.  She reports light vaginal bleeding and drainage.  She reported having a hemorrhage last week when she strained for a bowel movement. She denies nausea.  She reports constipation and is taking a stool softener and laxatives.  She reports fatigue.  She also reports that her sciatic pain has gone away since her stents were placed.

## 2013-02-21 NOTE — Telephone Encounter (Signed)
Called pt at home for post chemo follow up call.  Pt stated feeling slightly nauseated this am; pt took antiemetics with relief.  Pt had constipation issue - which pt took laxatives last pm, and also had good results.  Reinforced need to increase po fluids intake, instructed pt dietary foods that would help with constipation problem.  Pt voiced understanding and aware of next office appt.

## 2013-02-22 ENCOUNTER — Ambulatory Visit
Admission: RE | Admit: 2013-02-22 | Discharge: 2013-02-22 | Disposition: A | Discharge status: Home / Self Care | Payer: BC Managed Care – PPO | Source: Ambulatory Visit | Attending: Radiation Oncology | Admitting: Radiation Oncology

## 2013-02-23 ENCOUNTER — Ambulatory Visit
Admission: RE | Admit: 2013-02-23 | Discharge: 2013-02-23 | Disposition: A | Discharge status: Home / Self Care | Payer: BC Managed Care – PPO | Source: Ambulatory Visit | Attending: Radiation Oncology | Admitting: Radiation Oncology

## 2013-02-24 ENCOUNTER — Ambulatory Visit (HOSPITAL_BASED_OUTPATIENT_CLINIC_OR_DEPARTMENT_OTHER): Payer: BC Managed Care – PPO | Admitting: Oncology

## 2013-02-24 ENCOUNTER — Encounter: Payer: Self-pay | Admitting: Oncology

## 2013-02-24 ENCOUNTER — Encounter: Payer: Self-pay | Admitting: Nutrition

## 2013-02-24 ENCOUNTER — Other Ambulatory Visit (HOSPITAL_BASED_OUTPATIENT_CLINIC_OR_DEPARTMENT_OTHER): Payer: BC Managed Care – PPO

## 2013-02-24 ENCOUNTER — Telehealth: Payer: Self-pay | Admitting: *Deleted

## 2013-02-24 ENCOUNTER — Ambulatory Visit
Admission: RE | Admit: 2013-02-24 | Discharge: 2013-02-24 | Disposition: A | Discharge status: Home / Self Care | Payer: BC Managed Care – PPO | Source: Ambulatory Visit | Attending: Radiation Oncology | Admitting: Radiation Oncology

## 2013-02-24 VITALS — BP 123/74 | HR 106 | Temp 97.8°F | Resp 20 | Ht 67.0 in | Wt 190.1 lb

## 2013-02-24 DIAGNOSIS — D500 Iron deficiency anemia secondary to blood loss (chronic): Secondary | ICD-10-CM

## 2013-02-24 DIAGNOSIS — G893 Neoplasm related pain (acute) (chronic): Secondary | ICD-10-CM

## 2013-02-24 DIAGNOSIS — N135 Crossing vessel and stricture of ureter without hydronephrosis: Secondary | ICD-10-CM

## 2013-02-24 DIAGNOSIS — C539 Malignant neoplasm of cervix uteri, unspecified: Secondary | ICD-10-CM

## 2013-02-24 DIAGNOSIS — D509 Iron deficiency anemia, unspecified: Secondary | ICD-10-CM | POA: Insufficient documentation

## 2013-02-24 DIAGNOSIS — N1330 Unspecified hydronephrosis: Secondary | ICD-10-CM

## 2013-02-24 LAB — COMPREHENSIVE METABOLIC PANEL (CC13)
ALT: 8 U/L (ref 0–55)
ANION GAP: 9 meq/L (ref 3–11)
AST: 16 U/L (ref 5–34)
Albumin: 2.8 g/dL — ABNORMAL LOW (ref 3.5–5.0)
Alkaline Phosphatase: 57 U/L (ref 40–150)
BUN: 16.5 mg/dL (ref 7.0–26.0)
CO2: 26 meq/L (ref 22–29)
CREATININE: 1.2 mg/dL — AB (ref 0.6–1.1)
Calcium: 9.9 mg/dL (ref 8.4–10.4)
Chloride: 97 mEq/L — ABNORMAL LOW (ref 98–109)
Glucose: 134 mg/dl (ref 70–140)
Potassium: 4.5 mEq/L (ref 3.5–5.1)
Sodium: 132 mEq/L — ABNORMAL LOW (ref 136–145)
Total Bilirubin: 0.32 mg/dL (ref 0.20–1.20)
Total Protein: 7.4 g/dL (ref 6.4–8.3)

## 2013-02-24 LAB — MAGNESIUM (CC13): MAGNESIUM: 2 mg/dL (ref 1.5–2.5)

## 2013-02-24 MED ORDER — HYDROCODONE-ACETAMINOPHEN 5-325 MG PO TABS: ORAL_TABLET | ORAL | Status: DC

## 2013-02-24 NOTE — Progress Notes (Signed)
Provided patient with a variety of nutrition supplements at RN request.  Offered nutrition appointment to patient.  RN to confirm with patient.

## 2013-02-24 NOTE — Progress Notes (Signed)
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 She has had some nausea without vomiting, improved with prn anitemetics, taste changes and poor appetite, but otherwise has done ok with first chemo.   Start of chemotherapy was delayed to allow placement of bilateral JJ stents by Dr Louis Meckel on 02-16-13, after PET 02-07-13 showed new right hydronephrosis in addition to the left hydronephrosis found on her initial scans. At stent placement, bladder floor mucosa was noted to have bulky, clumping appearance, the left ureteral orifice was distorted and there was difficulty passing the right stent beyond pelvic inlet. Patient had resolution of most of the back pain since stents placed. She did not notice marked diuresis after stent placement, was able to do full prehydration for CDDP, has not had gross hematuria. She continues hydrocodone every 6 hrs prn; she has not used neurontin or flexeril in past week. Trospium has helped stent discomfort and she will continue this per urology. Stent change is planned in 3 months.  .   ONCOLOGIC HISTORY Patient presented with heavy vaginal bleeding, with low back pain and ~ 2 weeks of right hip pain. She was seen by Dr Sebastian Ache, with obvious tumor and biopsies from upper vagina and lower anterior vagina showing invasive squamous cell carcinoma. 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 She was referred to Dr  Josephina Shih 01-27-13, his exam remarkable for cervix replaced by fungating tumor extending to both pelvic sidewalls and nodular lesion halfway down anterior vaginal wall. Right hip xray 01-27-13 showed no bony mets. PET 02-10-13 at [facility] had new right hydronephrosis, hypermetabolic uptake in para-aortic nodes, left iliac nodes, bulky cervical mass, adenopathy adjacent to rectum, lungs and bones ok. Bilateral JJ stents were placed 02-17-12. First CDDP was given 02-20-13.    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 Has to increase pressure to void, but empties bladder of large amounts of urine when she does this. Appetite poor with taste changes -I will ask [facility] dietician to see, supplement samples requested. Remainder of 10 point Review of Systems negative.  Objective:  Vital signs in last 24 hours:  BP 123/74  Pulse 106  Temp(Src) 97.8 F (36.6 C) (Oral)  Resp 20  Ht 5\' 7"  (1.702 m)  Wt 190 lb 1.6 oz (86.229 kg)  BMI 29.77 kg/m2  LMP 02/14/2013 Weight is stable. Alert, oriented and appropriate conversation, looks tired. Ambulatory without assistance, moves much more easily.  No alopecia  HEENT:PERRL, sclerae not icteric. Oral mucosa moist without lesions, posterior pharynx clear.  Neck supple. No JVD.  Lymphatics:no cervical,suraclavicular adenopathy Resp: clear to auscultation bilaterally and normal percussion bilaterally Cardio: regular rate and rhythm. No gallop. GI: soft, nontender, not distended, no mass or organomegaly. Some bowel sounds.  Musculoskeletal/ Extremities: without pitting edema, cords, tenderness Neuro: no peripheral neuropathy. Otherwise nonfocal Skin without rash, ecchymosis, petechiae  Lab Results:  Results for orders placed in visit on 02/20/13  CBC WITH DIFFERENTIAL  Result Value Ref Range   WBC 6.9  3.9 - 10.3 10e3/uL   NEUT# 5.7  1.5 - 6.5 10e3/uL   HGB 10.1 (*) 11.6 - 15.9  g/dL   HCT 31.9 (*) 34.8 - 46.6 %   Platelets 317  145 - 400 10e3/uL   MCV 87.4  79.5 - 101.0 fL   MCH 27.7  25.1 - 34.0 pg   MCHC 31.7  31.5 - 36.0 g/dL   RBC 3.65 (*) 3.70 - 5.45 10e6/uL   RDW 14.4  11.2 - 14.5 %   lymph# 0.5 (*) 0.9 - 3.3 10e3/uL   MONO# 0.5  0.1 - 0.9 10e3/uL   Eosinophils Absolute 0.2  0.0 - 0.5 10e3/uL   Basophils Absolute 0.0  0.0 - 0.1 10e3/uL   NEUT% 82.3 (*) 38.4 - 76.8 %   LYMPH% 7.5 (*) 14.0 - 49.7 %   MONO% 6.8  0.0 - 14.0 %   EOS% 2.8  0.0 - 7.0 %   BASO% 0.6  0.0 - 2.0 %   nRBC 0  0 - 0 %  COMPREHENSIVE METABOLIC PANEL (KV42)      Result Value Ref Range   Sodium 132 (*) 136 - 145 mEq/L   Potassium 4.5  3.5 - 5.1 mEq/L   Chloride 97 (*) 98 - 109 mEq/L   CO2 26  22 - 29 mEq/L   Glucose 134  70 - 140 mg/dl   BUN 16.5  7.0 - 26.0 mg/dL   Creatinine 1.2 (*) 0.6 - 1.1 mg/dL   Total Bilirubin 0.32  0.20 - 1.20 mg/dL   Alkaline Phosphatase 57  40 - 150 U/L   AST 16  5 - 34 U/L   ALT 8  0 - 55 U/L   Total Protein 7.4  6.4 - 8.3 g/dL   Albumin 2.8 (*) 3.5 - 5.0 g/dL   Calcium 9.9  8.4 - 10.4 mg/dL   Anion Gap 9  3 - 11 mEq/L  MAGNESIUM (CC13)      Result Value Ref Range   Magnesium 2.0  1.5 - 2.5 mg/dl    NOTE NEED TO KEEP HGB >=10 DURING RT  Studies/Results:  No results found.  Medications: I have reviewed the patient's current medications. I have suggested trying ativan at hs, as this may help her fall back asleep more quickly. We have discussed adjusting laxatives. She has been taking the ferrous fumarate with OJ as instructed. Ginger gum also has helped her nausea.  DISCUSSION: RT is scheduled thru 3--15, so that weekly CDDP will go thru 03-13-13.  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 She will have chemistries with magnesium on Fridays and  CBCs with chemo on Mondays during chemo. Hemoglobin needs to be kept >=10 during RT, so will need PRBCs if drops. I will see her on 2-20 and have asked PA to see her the following week; I will see her also 3-11. 2.JJ stents placed for bilateral ureteral obstruction. Dr Herrick's prompt assistance much appreciated. Continue trospium for bladder symptoms. 3.iron deficiency anemia related to vaginal bleeding and some blood loss with stents. Need to keep hgb >10 during this RT. On oral iron. 4.never colonoscopy/ ? 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 I have asked [facility] support staff to  be in contact.    Patient is in agreement with plan and knows that she can contact us between visits if needed.   Genie Mirabal P, MD   02/24/2013, 10:02 AM

## 2013-02-24 NOTE — Telephone Encounter (Signed)
appts made and printed...td 

## 2013-02-25 ENCOUNTER — Other Ambulatory Visit: Payer: Self-pay | Admitting: *Deleted

## 2013-02-27 ENCOUNTER — Ambulatory Visit (HOSPITAL_BASED_OUTPATIENT_CLINIC_OR_DEPARTMENT_OTHER): Payer: BC Managed Care – PPO | Admitting: Oncology

## 2013-02-27 ENCOUNTER — Encounter: Payer: BC Managed Care – PPO | Admitting: Nutrition

## 2013-02-27 ENCOUNTER — Other Ambulatory Visit: Payer: Self-pay | Admitting: Oncology

## 2013-02-27 ENCOUNTER — Ambulatory Visit (HOSPITAL_BASED_OUTPATIENT_CLINIC_OR_DEPARTMENT_OTHER): Payer: BC Managed Care – PPO

## 2013-02-27 ENCOUNTER — Other Ambulatory Visit (HOSPITAL_BASED_OUTPATIENT_CLINIC_OR_DEPARTMENT_OTHER): Payer: BC Managed Care – PPO

## 2013-02-27 ENCOUNTER — Ambulatory Visit
Admission: RE | Admit: 2013-02-27 | Discharge: 2013-02-27 | Disposition: A | Discharge status: Home / Self Care | Payer: BC Managed Care – PPO | Source: Ambulatory Visit | Attending: Radiation Oncology | Admitting: Radiation Oncology

## 2013-02-27 ENCOUNTER — Ambulatory Visit (HOSPITAL_COMMUNITY)
Admission: RE | Admit: 2013-02-27 | Discharge: 2013-02-27 | Disposition: A | Discharge status: Home / Self Care | Payer: BC Managed Care – PPO | Source: Ambulatory Visit | Attending: Oncology | Admitting: Oncology

## 2013-02-27 ENCOUNTER — Encounter: Payer: Self-pay | Admitting: Oncology

## 2013-02-27 VITALS — BP 120/76 | HR 93 | Temp 97.4°F | Resp 18

## 2013-02-27 VITALS — BP 149/86 | HR 102 | Temp 97.6°F | Wt 184.6 lb

## 2013-02-27 DIAGNOSIS — C539 Malignant neoplasm of cervix uteri, unspecified: Secondary | ICD-10-CM

## 2013-02-27 DIAGNOSIS — D500 Iron deficiency anemia secondary to blood loss (chronic): Secondary | ICD-10-CM

## 2013-02-27 DIAGNOSIS — N135 Crossing vessel and stricture of ureter without hydronephrosis: Secondary | ICD-10-CM

## 2013-02-27 DIAGNOSIS — R109 Unspecified abdominal pain: Secondary | ICD-10-CM

## 2013-02-27 DIAGNOSIS — D509 Iron deficiency anemia, unspecified: Secondary | ICD-10-CM

## 2013-02-27 DIAGNOSIS — N1330 Unspecified hydronephrosis: Secondary | ICD-10-CM

## 2013-02-27 LAB — CBC WITH DIFFERENTIAL/PLATELET
BASO%: 0.1 % (ref 0.0–2.0)
BASOS ABS: 0 10*3/uL (ref 0.0–0.1)
EOS%: 1.7 % (ref 0.0–7.0)
Eosinophils Absolute: 0.1 10*3/uL (ref 0.0–0.5)
HCT: 29.4 % — ABNORMAL LOW (ref 34.8–46.6)
HGB: 9.6 g/dL — ABNORMAL LOW (ref 11.6–15.9)
LYMPH#: 0.2 10*3/uL — AB (ref 0.9–3.3)
LYMPH%: 2.7 % — ABNORMAL LOW (ref 14.0–49.7)
MCH: 27.9 pg (ref 25.1–34.0)
MCHC: 32.7 g/dL (ref 31.5–36.0)
MCV: 85.5 fL (ref 79.5–101.0)
MONO#: 0.7 10*3/uL (ref 0.1–0.9)
MONO%: 8.9 % (ref 0.0–14.0)
NEUT#: 6.5 10*3/uL (ref 1.5–6.5)
NEUT%: 86.6 % — ABNORMAL HIGH (ref 38.4–76.8)
Platelets: 210 10*3/uL (ref 145–400)
RBC: 3.44 10*6/uL — ABNORMAL LOW (ref 3.70–5.45)
RDW: 14.2 % (ref 11.2–14.5)
WBC: 7.5 10*3/uL (ref 3.9–10.3)
nRBC: 0 % (ref 0–0)

## 2013-02-27 LAB — URINALYSIS, MICROSCOPIC - CHCC
BILIRUBIN (URINE): NEGATIVE
Glucose: NEGATIVE mg/dL
Ketones: NEGATIVE mg/dL
Nitrite: NEGATIVE
Protein: 100 mg/dL
Specific Gravity, Urine: 1.015 (ref 1.003–1.035)
Urobilinogen, UR: 0.2 mg/dL (ref 0.2–1)
pH: 6.5 (ref 4.6–8.0)

## 2013-02-27 LAB — PREPARE RBC (CROSSMATCH)

## 2013-02-27 LAB — ABO/RH: ABO/RH(D): B POS

## 2013-02-27 MED ORDER — HYDROMORPHONE HCL PF 4 MG/ML IJ SOLN
2.0000 mg | Freq: Once | INTRAMUSCULAR | Status: AC
  Administered 2013-02-27: 2 mg via INTRAVENOUS

## 2013-02-27 MED ORDER — HYDROMORPHONE HCL PF 1 MG/ML IJ SOLN
2.0000 mg | Freq: Once | INTRAMUSCULAR | Status: DC
  Filled 2013-02-27: qty 2

## 2013-02-27 MED ORDER — HYDROMORPHONE HCL PF 4 MG/ML IJ SOLN
INTRAMUSCULAR | Status: AC
  Filled 2013-02-27: qty 1

## 2013-02-27 MED ORDER — HYDROMORPHONE HCL 2 MG PO TABS: 2.0000 mg | ORAL_TABLET | ORAL | Status: DC | PRN

## 2013-02-27 MED ORDER — ACETAMINOPHEN 325 MG PO TABS
ORAL_TABLET | ORAL | Status: AC
  Filled 2013-02-27: qty 2

## 2013-02-27 MED ORDER — HYDROMORPHONE HCL PF 4 MG/ML IJ SOLN
1.0000 mg | INTRAMUSCULAR | Status: DC | PRN
  Administered 2013-02-27 (×2): 1 mg via INTRAVENOUS

## 2013-02-27 MED ORDER — SODIUM CHLORIDE 0.9 % IV SOLN
Freq: Once | INTRAVENOUS | Status: AC
  Administered 2013-02-27: 10:00:00 via INTRAVENOUS

## 2013-02-27 MED ORDER — ACETAMINOPHEN 325 MG PO TABS
650.0000 mg | ORAL_TABLET | Freq: Once | ORAL | Status: AC
  Administered 2013-02-27: 650 mg via ORAL

## 2013-02-27 NOTE — Progress Notes (Signed)
In and Out Urinary Catheter, latex free, performed on pt.. She tolerated well.  300 cc cloudy urine w/ red specks/ sediment obtained.  Specimen sent to lab.  Dr. Marko Plume aware.  Pt attempted to urinate w/o success at aprox 2 pm.   She was able to urinate prior to d/c at aprox 3:40 pm.   Pt reported overall feeling better with decreased pain on d/c.   Her friend, Amy, driving pt home and arranging for someone to stay with patient at home tonight.

## 2013-02-27 NOTE — Patient Instructions (Signed)
Blood Transfusion Information WHAT IS A BLOOD TRANSFUSION? A transfusion is the replacement of blood or some of its parts. Blood is made up of multiple cells which provide different functions.  Red blood cells carry oxygen and are used for blood loss replacement.  White blood cells fight against infection.  Platelets control bleeding.  Plasma helps clot blood.  Other blood products are available for specialized needs, such as hemophilia or other clotting disorders. BEFORE THE TRANSFUSION  Who gives blood for transfusions?   You may be able to donate blood to be used at a later date on yourself (autologous donation).  Relatives can be asked to donate blood. This is generally not any safer than if you have received blood from a stranger. The same precautions are taken to ensure safety when a relative's blood is donated.  Healthy volunteers who are fully evaluated to make sure their blood is safe. This is blood bank blood. Transfusion therapy is the safest it has ever been in the practice of medicine. Before blood is taken from a donor, a complete history is taken to make sure that person has no history of diseases nor engages in risky social behavior (examples are intravenous drug use or sexual activity with multiple partners). The donor's travel history is screened to minimize risk of transmitting infections, such as malaria. The donated blood is tested for signs of infectious diseases, such as HIV and hepatitis. The blood is then tested to be sure it is compatible with you in order to minimize the chance of a transfusion reaction. If you or a relative donates blood, this is often done in anticipation of surgery and is not appropriate for emergency situations. It takes many days to process the donated blood. RISKS AND COMPLICATIONS Although transfusion therapy is very safe and saves many lives, the main dangers of transfusion include:   Getting an infectious disease.  Developing a  transfusion reaction. This is an allergic reaction to something in the blood you were given. Every precaution is taken to prevent this. The decision to have a blood transfusion has been considered carefully by your caregiver before blood is given. Blood is not given unless the benefits outweigh the risks. AFTER THE TRANSFUSION  Right after receiving a blood transfusion, you will usually feel much better and more energetic. This is especially true if your red blood cells have gotten low (anemic). The transfusion raises the level of the red blood cells which carry oxygen, and this usually causes an energy increase.  The nurse administering the transfusion will monitor you carefully for complications. HOME CARE INSTRUCTIONS  No special instructions are needed after a transfusion. You may find your energy is better. Speak with your caregiver about any limitations on activity for underlying diseases you may have. SEEK MEDICAL CARE IF:   Your condition is not improving after your transfusion.  You develop redness or irritation at the intravenous (IV) site. SEEK IMMEDIATE MEDICAL CARE IF:  Any of the following symptoms occur over the next 12 hours:  Shaking chills.  You have a temperature by mouth above 102 F (38.9 C), not controlled by medicine.  Chest, back, or muscle pain.  People around you feel you are not acting correctly or are confused.  Shortness of breath or difficulty breathing.  Dizziness and fainting.  You get a rash or develop hives.  You have a decrease in urine output.  Your urine turns a dark color or changes to pink, red, or brown. Any of the following   symptoms occur over the next 10 days:  You have a temperature by mouth above 102 F (38.9 C), not controlled by medicine.  Shortness of breath.  Weakness after normal activity.  The white part of the eye turns yellow (jaundice).  You have a decrease in the amount of urine or are urinating less often.  Your  urine turns a dark color or changes to pink, red, or brown. Document Released: 12/27/1999 Document Revised: 03/23/2011 Document Reviewed: 08/15/2007 Exit[employer] Patient Information 2014 [employer].  

## 2013-02-27 NOTE — Progress Notes (Signed)
OFFICE PROGRESS NOTE   02/27/2013   Physicians:D.ClarkePearson,J.Kinard, C.Melinda Crutch (PCP), Roney Mans, Louis Meckel ([facility])   INTERVAL HISTORY:  Patient is seen for unscheduled visit due to severe pain, at office today for sensitizing CDDP with RT in process for clinical IIIB cervix cancer. Altho flank pain improved after placement of JJ stents for bilateral hydronephrosis, she now has more pain bilateral lower quadrants to pelvis which is not improved with hydrocodone every 3 hours. She is unable to sleep due to pain and is not taking po's including prehydration for CDDP. She voids if she "pushes down", which intensifies pain. She denies fever or hematuria. She has had loose stools which are not unexpected with RT. She denies bleeding. She has not begun Flomax prescribed by urology yesterday.   No central catheter.   ONCOLOGIC HISTORY Patient presented with heavy vaginal bleeding, with low back pain and right hip pain. She was seen by Dr Sebastian Ache, with obvious tumor and biopsies from upper vagina and lower anterior vagina showing invasive squamous cell carcinoma. 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 She was referred to Dr Josephina Shih 01-27-13, his exam remarkable for cervix replaced by fungating tumor extending to both pelvic sidewalls and nodular lesion halfway down anterior vaginal wall. Right hip xray 01-27-13 showed no bony mets. PET 02-10-13 at [facility] had new right hydronephrosis, hypermetabolic uptake in para-aortic nodes, left iliac nodes, bulky cervical mass, adenopathy adjacent to rectum, lungs and bones ok. Bilateral JJ stents were placed 02-17-12. First CDDP was given 02-20-13.  Chemo held 2-16 with acute pain and inadequate hydration; transfused 1 unit PRBCs 2-16 for Hgb 9.6.  Review of systems as above, also: Exhausted "I have not slept in months", still trying to do full care for housemate at hs; discussed, she plans to have sister in law spend tonight to do that care and to ask housemate's 2 adult daughters to be involved. Remainder of 10 point Review of Systems negative.  Objective:  Vital signs in last 24 hours:  BP 149/86  Pulse 102  Temp(Src) 97.6 F (36.4 C) (Oral)  Wt 184 lb 9.6 oz (83.734 kg)  LMP 02/14/2013 Weight is down 6 lbs from 2-13. Initially writhing in pain on bench by elevator and on exam bed. She was I&O cathed easily for 300 cc urine now, culture sent; this did not improve pain. (NOTE LATEX ALLERGIC)  Alert, oriented and appropriate. Able to stand, get in and out of WC with minimal assistance, change position in bed.  HEENT:PERRL, sclerae not icteric. Oral mucosa somewhat day without lesions, posterior pharynx clear.  Neck supple. No JVD.  Lymphatics:no cervical,suraclavicular adenopathy Resp: clear to auscultation bilaterally Cardio: regular rate and rhythm. No gallop. GI: soft, nontender, not distended, no mass or organomegaly.  Some bowel sounds.  Musculoskeletal/ Extremities: without pitting edema, cords, tenderness Neuro: no peripheral neuropathy. Otherwise nonfocal Skin without rash, ecchymosis, petechiae   Lab Results:  Results for orders placed in visit on 02/20/13  CBC WITH DIFFERENTIAL      Result Value Ref Range   WBC 6.9  3.9 - 10.3 10e3/uL   NEUT# 5.7  1.5 - 6.5 10e3/uL   HGB 10.1 (*) 11.6 - 15.9 g/dL   HCT 31.9 (*) 34.8 - 46.6 %  Platelets 317  145 - 400 10e3/uL   MCV 87.4  79.5 - 101.0 fL   MCH 27.7  25.1 - 34.0 pg   MCHC 31.7  31.5 - 36.0 g/dL   RBC 3.65 (*) 3.70 - 5.45 10e6/uL   RDW 14.4  11.2 - 14.5 %   lymph# 0.5 (*) 0.9 - 3.3 10e3/uL   MONO# 0.5  0.1 - 0.9 10e3/uL   Eosinophils Absolute 0.2  0.0 -  0.5 10e3/uL   Basophils Absolute 0.0  0.0 - 0.1 10e3/uL   NEUT% 82.3 (*) 38.4 - 76.8 %   LYMPH% 7.5 (*) 14.0 - 49.7 %   MONO% 6.8  0.0 - 14.0 %   EOS% 2.8  0.0 - 7.0 %   BASO% 0.6  0.0 - 2.0 %   nRBC 0  0 - 0 %  COMPREHENSIVE METABOLIC PANEL (0000000)      Result Value Ref Range   Sodium 132 (*) 136 - 145 mEq/L   Potassium 4.5  3.5 - 5.1 mEq/L   Chloride 97 (*) 98 - 109 mEq/L   CO2 26  22 - 29 mEq/L   Glucose 134  70 - 140 mg/dl   BUN 16.5  7.0 - 26.0 mg/dL   Creatinine 1.2 (*) 0.6 - 1.1 mg/dL   Total Bilirubin 0.32  0.20 - 1.20 mg/dL   Alkaline Phosphatase 57  40 - 150 U/L   AST 16  5 - 34 U/L   ALT 8  0 - 55 U/L   Total Protein 7.4  6.4 - 8.3 g/dL   Albumin 2.8 (*) 3.5 - 5.0 g/dL   Calcium 9.9  8.4 - 10.4 mg/dL   Anion Gap 9  3 - 11 mEq/L  MAGNESIUM (CC13)      Result Value Ref Range   Magnesium 2.0  1.5 - 2.5 mg/dl   Note creatinine slightly higher on these most recent chemistries.  UA available after visit : large blood, TNTC RBCs, 11-20 WBCs few bacteria, moderate LE. Culture pending.  Studies/Results:  No results found.  Medications: I have reviewed the patient's current medications. She was given dilaudid IV 1 mg then 2 mg doses, with improvement in pain; she is given prescription for dilaudid 2 mg 1-2 tabs q 4 hr prn. She may need duragesic or other sustained release preparation. She will begin Flomax now.  DISCUSSION: Chemotherapy held today with acute pain and inadequate POs. IVF and IV dilaudid administered as above. Hemoglobin needs to be >=10 for optimal radiation effects, and patient in agreement with transfusion of 1 unit PRBCs also done today. She was transported to radiation for that treatment today.   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 Acute pain and  poor hydration today such that chemo was held. She will have chemistries with magnesium on Fridays and CBCs with chemo on Mondays during chemo. Hemoglobin needs to be kept >=10 during RT. I will see her on 2-20 and covering provider the following week; I will see her also 3-11.  2.JJ stents placed for bilateral ureteral obstruction. Present pain seems at least in part related to the stents and we may need additional assistance from urology. 3.iron deficiency anemia related to vaginal bleeding and some blood loss with stents. Need to keep hgb >10 during this RT. On oral iron. Transfused 1 unit PRBCs  today. 4.never colonoscopy/ ? 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 I have asked [facility] support staff to be in contact. Patient is not able to do this care at present.    Patient was more comfortable when interventions completed today.    Anamari Galeas P, MD   02/27/2013, 11:08 AM

## 2013-02-28 ENCOUNTER — Telehealth: Payer: Self-pay | Admitting: *Deleted

## 2013-02-28 ENCOUNTER — Other Ambulatory Visit: Payer: Self-pay | Admitting: *Deleted

## 2013-02-28 ENCOUNTER — Encounter: Payer: BC Managed Care – PPO | Admitting: Nutrition

## 2013-02-28 ENCOUNTER — Ambulatory Visit
Admission: RE | Admit: 2013-02-28 | Payer: BC Managed Care – PPO | Source: Ambulatory Visit | Admitting: Radiation Oncology

## 2013-02-28 ENCOUNTER — Ambulatory Visit
Admission: RE | Admit: 2013-02-28 | Discharge: 2013-02-28 | Disposition: A | Discharge status: Home / Self Care | Payer: BC Managed Care – PPO | Source: Ambulatory Visit | Attending: Radiation Oncology | Admitting: Radiation Oncology

## 2013-02-28 VITALS — BP 123/67 | HR 106 | Temp 97.8°F | Ht 67.0 in | Wt 186.7 lb

## 2013-02-28 DIAGNOSIS — C539 Malignant neoplasm of cervix uteri, unspecified: Secondary | ICD-10-CM

## 2013-02-28 LAB — TYPE AND SCREEN
ABO/RH(D): B POS
ANTIBODY SCREEN: NEGATIVE
UNIT DIVISION: 0

## 2013-02-28 LAB — URINE CULTURE

## 2013-02-28 MED ORDER — CIPROFLOXACIN HCL 250 MG PO TABS: 250.0000 mg | ORAL_TABLET | Freq: Two times a day (BID) | ORAL | Status: DC

## 2013-02-28 NOTE — Telephone Encounter (Signed)
Spoke with patient, states she is still having a lot of pain. Took dilaudid at 8p, 12a, 4a and 8a with only slight relief. Had "diarrhea all night-is slowing down now". Tried to eat crackers last night and threw up. Took ativan SL after vomiting and went to bed, no further vomiting. Has only had water to drink. Sister is bringing imodium and gator-aid to patient this morning. Pt has RT today and meeting with Dr Sondra Come.

## 2013-02-28 NOTE — Progress Notes (Signed)
Gina Barnett has had 12 fractions to her pelvis.  She is having pain in her lower abdomen and lower back that she is rating at a 10/10.  She started taking dilaudid 2 mg (2 tabs) yesterday which she says is not really helping.  She reports that her kidney stents have also been painful.  She reports having to "push" to urinate.  She started flomax yesterday.  She reports having diarrhea about every hour from 7 pm yesterday until this morning.  She is not sure if she should take Immodium due to her tumor pressing on her intestines.  She reports a new hemorrhoid that showed up a week ago.  She reports a poor appetite.  She is going to start the [employer] today.  She did not have chemotherapy yesterday but did have a blood transfusion.  She reports a small amount of vaginal bleeding.

## 2013-02-28 NOTE — Telephone Encounter (Signed)
Spoke with [facility] per Dr Marko Plume request. They will see patient on 03/01/13 @ 12:30

## 2013-02-28 NOTE — Progress Notes (Signed)
[facility]     Rexene Edison, M.D. [place], [place] [address]               Blair Promise, M.D., Ph.D. Phone: [phone]      [place] A. Tammi Klippel, M.D. Fax: [phone]      Jodelle Gross, M.D., Ph.D.         Thea Silversmith, M.D.         Wyvonnia Lora, M.D Weekly Treatment Management Note  Name: Gina Barnett     MRN: [id number]        CSN: [id number] Date: 02/28/2013      DOB: [birth year]  CC:  Melinda Crutch, MD         Harrington Challenger    Status: Outpatient  Diagnosis: The encounter diagnosis was Cervix cancer.  Current Dose: 21.6 Gy  Current Fraction: 12  Planned Dose: 45 + Gy  Narrative: Gina Barnett was seen today for weekly treatment management. The chart was checked and MVCT  were reviewed. She is having a lot of pain at this time. Her chemotherapy was held yesterday. The patient did receive IV fluids as well as Dilaudid. She also underwent transfusion for anemia. Left side the patient had significant problems with diarrhea kept her awake most of the night.  She is also having difficulty with emptying her bladder. She was started on Flomax recently.  She may see her urologist later this week.  Patient may have to back off on her MiraLax and stool softeners if she continues to have problems with diarrhea.  I suggested one or 2 Imodium today as part of her discomfort may be related to bowel cramping  Latex and Penicillins Current Outpatient Prescriptions  Medication Sig Dispense Refill  . albuterol (PROVENTIL HFA;VENTOLIN HFA) 108 (90 BASE) MCG/ACT inhaler Inhale into the lungs every 6 (six) hours as needed for wheezing or shortness of breath.      . Budesonide (PULMICORT IN) Inhale 2 puffs into the lungs every evening.       . clobetasol cream (TEMOVATE) 5.40 % Apply 1 application topically 2 (two) times daily as needed.       . cyclobenzaprine (FLEXERIL) 10 MG tablet Take 1 tablet (10 mg total) by mouth 3 (three) times daily  as needed for muscle spasms.  90 tablet  0  . ferrous fumarate (HEMOCYTE) 325 (106 FE) MG TABS tablet Take 1 tablet twice a day on an empty stomach with OJ  60 each  2  . fexofenadine (ALLEGRA) 180 MG tablet Take 180 mg by mouth every morning.      Marland Kitchen HYDROmorphone (DILAUDID) 2 MG tablet Take 1-2 tablets (2-4 mg total) by mouth every 4 (four) hours as needed for moderate pain.  40 tablet  0  . LORazepam (ATIVAN) 1 MG tablet Take 1 tablet under tongue or swallow every 6 hours as needed for nausea. Will make drowsy.  20 tablet  0  . ondansetron (ZOFRAN) 8 MG tablet Take 1-2 tablets (8-16 mg total) by mouth every 12 (twelve) hours as needed for nausea or vomiting.  30 tablet  1  . ondansetron (ZOFRAN) 8 MG tablet Take 1 tablet (8 mg total) by mouth 2 (two) times daily as needed. Take two times a day as needed for nausea or vomiting starting on the third day after chemotherapy.  30 tablet  1  . Pseudoephedrine-DM-GG-APAP (SUDAFED COLD/COUGH PO) Take 1 tablet by mouth  every morning.       . tamsulosin (FLOMAX) 0.4 MG CAPS capsule Take 0.4 mg by mouth.      . triamcinolone cream (KENALOG) 0.1 % Apply 1 application topically as needed.      . Trospium Chloride 60 MG CP24 Take 1 capsule (60 mg total) by mouth daily.  7 each  0  . bisacodyl (DULCOLAX) 5 MG EC tablet Take 5 mg by mouth daily as needed for moderate constipation.      . Cholecalciferol (VITAMIN D3) 5000 UNITS CAPS Take 1 capsule by mouth daily.       . ciprofloxacin (CIPRO) 250 MG tablet Take 1 tablet (250 mg total) by mouth 2 (two) times daily.  14 tablet  0  . gabapentin (NEURONTIN) 300 MG capsule Take 300 mg by mouth at bedtime.      Marland Kitchen HYDROcodone-acetaminophen (NORCO/VICODIN) 5-325 MG per tablet Take 1-2 tablets every 6 hours as needed for pain  40 tablet  0  . polyethylene glycol (MIRALAX / GLYCOLAX) packet Take 17 g by mouth daily.       No current facility-administered medications for this encounter.   Facility-Administered Medications  Ordered in Other Encounters  Medication Dose Route Frequency Provider Last Rate Last Dose  . HYDROmorphone (DILAUDID) injection 1 mg  1 mg Intravenous Q2H PRN Gordy Levan, MD   1 mg at 02/27/13 1055   Labs:  Lab Results  Component Value Date   WBC 7.5 02/27/2013   HGB 9.6* 02/27/2013   HCT 29.4* 02/27/2013   MCV 85.5 02/27/2013   PLT 210 02/27/2013   Lab Results  Component Value Date   CREATININE 1.2* 02/24/2013   BUN 16.5 02/24/2013   NA 132* 02/24/2013   K 4.5 02/24/2013   CO2 26 02/24/2013   Lab Results  Component Value Date   ALT 8 02/24/2013   AST 16 02/24/2013   BILITOT 0.32 02/24/2013    Physical Examination:  Filed Vitals:   02/28/13 1208  BP: 123/67  Pulse: 106  Temp: 97.8 F (36.6 C)    Wt Readings from Last 3 Encounters:  02/28/13 186 lb 11.2 oz (84.687 kg)  02/27/13 184 lb 9.6 oz (83.734 kg)  02/24/13 190 lb 1.6 oz (86.229 kg)     Lungs - Normal respiratory effort, chest expands symmetrically. Lungs are clear to auscultation, no crackles or wheezes.  Heart has regular rhythm and rate  Abdomen is soft and non tender with normal bowel sounds.  No rebound or guarding.  Assessment:  Patient tolerating treatments well except for above issues  Plan: Continue treatment per original radiation prescription

## 2013-02-28 NOTE — Telephone Encounter (Signed)
Faxed UA results to [facility] and an Urgent request for patient to be seen today if possible re: pain. [facility] is closed today until 12 due to weather.

## 2013-03-01 ENCOUNTER — Telehealth: Payer: Self-pay | Admitting: Oncology

## 2013-03-01 ENCOUNTER — Ambulatory Visit
Admission: RE | Admit: 2013-03-01 | Discharge: 2013-03-01 | Disposition: A | Discharge status: Home / Self Care | Payer: BC Managed Care – PPO | Source: Ambulatory Visit | Attending: Radiation Oncology | Admitting: Radiation Oncology

## 2013-03-01 NOTE — Telephone Encounter (Signed)
Medical Oncology  I spoke directly with Dr Louis Meckel now, as he is to see patient this afternoon. Let him know urine culture from 2-16 is negative; he will stop Cipro when he sees her unless obviously this has helped. Told him that she was cathed for 300cc at our office (nonlatex catheter used) which did not help symptoms, and that she was to start Flomax after I saw her. He has some concern that stents may not remain functional, in which case would need to consider PCN, but hopefully these will get her thru the active treatment now. Stent discomfort may improve just given more time. He may try B&O suppository.  Godfrey Pick, MD

## 2013-03-02 ENCOUNTER — Other Ambulatory Visit: Payer: Self-pay | Admitting: Oncology

## 2013-03-02 ENCOUNTER — Telehealth: Payer: Self-pay | Admitting: *Deleted

## 2013-03-02 ENCOUNTER — Ambulatory Visit
Admission: RE | Admit: 2013-03-02 | Discharge: 2013-03-02 | Disposition: A | Discharge status: Home / Self Care | Payer: BC Managed Care – PPO | Source: Ambulatory Visit | Attending: Radiation Oncology | Admitting: Radiation Oncology

## 2013-03-02 ENCOUNTER — Other Ambulatory Visit: Payer: Self-pay | Admitting: *Deleted

## 2013-03-02 DIAGNOSIS — C539 Malignant neoplasm of cervix uteri, unspecified: Secondary | ICD-10-CM

## 2013-03-02 DIAGNOSIS — N135 Crossing vessel and stricture of ureter without hydronephrosis: Secondary | ICD-10-CM

## 2013-03-02 MED ORDER — HYDROMORPHONE HCL 2 MG PO TABS: 2.0000 mg | ORAL_TABLET | ORAL | Status: DC | PRN

## 2013-03-02 NOTE — Telephone Encounter (Signed)
Pt came into clinic after RT. State she has stopped cipro per Dr Louis Meckel. Needed refill on Dilaudid-done by on call physician. Is taking Dilaudid 2 mg-  2 tablets every 4 hours. States she has not needed imodium for bowels and is taking in fluids.

## 2013-03-03 ENCOUNTER — Ambulatory Visit (HOSPITAL_BASED_OUTPATIENT_CLINIC_OR_DEPARTMENT_OTHER): Payer: BC Managed Care – PPO | Admitting: Oncology

## 2013-03-03 ENCOUNTER — Other Ambulatory Visit (HOSPITAL_BASED_OUTPATIENT_CLINIC_OR_DEPARTMENT_OTHER): Payer: BC Managed Care – PPO

## 2013-03-03 ENCOUNTER — Ambulatory Visit
Admission: RE | Admit: 2013-03-03 | Discharge: 2013-03-03 | Disposition: A | Discharge status: Home / Self Care | Payer: BC Managed Care – PPO | Source: Ambulatory Visit | Attending: Radiation Oncology | Admitting: Radiation Oncology

## 2013-03-03 VITALS — BP 119/74 | HR 111 | Temp 98.3°F | Resp 18 | Ht 67.0 in | Wt 185.0 lb

## 2013-03-03 DIAGNOSIS — E871 Hypo-osmolality and hyponatremia: Secondary | ICD-10-CM

## 2013-03-03 DIAGNOSIS — N135 Crossing vessel and stricture of ureter without hydronephrosis: Secondary | ICD-10-CM

## 2013-03-03 DIAGNOSIS — E878 Other disorders of electrolyte and fluid balance, not elsewhere classified: Secondary | ICD-10-CM

## 2013-03-03 DIAGNOSIS — N898 Other specified noninflammatory disorders of vagina: Secondary | ICD-10-CM

## 2013-03-03 DIAGNOSIS — C539 Malignant neoplasm of cervix uteri, unspecified: Secondary | ICD-10-CM

## 2013-03-03 DIAGNOSIS — D500 Iron deficiency anemia secondary to blood loss (chronic): Secondary | ICD-10-CM

## 2013-03-03 LAB — CBC WITH DIFFERENTIAL/PLATELET
BASO%: 0.2 % (ref 0.0–2.0)
BASOS ABS: 0 10*3/uL (ref 0.0–0.1)
EOS ABS: 0.1 10*3/uL (ref 0.0–0.5)
EOS%: 0.6 % (ref 0.0–7.0)
HCT: 32.9 % — ABNORMAL LOW (ref 34.8–46.6)
HGB: 10.8 g/dL — ABNORMAL LOW (ref 11.6–15.9)
LYMPH%: 2.8 % — AB (ref 14.0–49.7)
MCH: 28.3 pg (ref 25.1–34.0)
MCHC: 32.8 g/dL (ref 31.5–36.0)
MCV: 86.4 fL (ref 79.5–101.0)
MONO#: 0.4 10*3/uL (ref 0.1–0.9)
MONO%: 4.7 % (ref 0.0–14.0)
NEUT%: 91.7 % — ABNORMAL HIGH (ref 38.4–76.8)
NEUTROS ABS: 7.8 10*3/uL — AB (ref 1.5–6.5)
NRBC: 0 % (ref 0–0)
PLATELETS: 255 10*3/uL (ref 145–400)
RBC: 3.81 10*6/uL (ref 3.70–5.45)
RDW: 14.3 % (ref 11.2–14.5)
WBC: 8.5 10*3/uL (ref 3.9–10.3)
lymph#: 0.2 10*3/uL — ABNORMAL LOW (ref 0.9–3.3)

## 2013-03-03 LAB — COMPREHENSIVE METABOLIC PANEL (CC13)
AST: 14 U/L (ref 5–34)
Albumin: 2.4 g/dL — ABNORMAL LOW (ref 3.5–5.0)
Alkaline Phosphatase: 82 U/L (ref 40–150)
Anion Gap: 10 mEq/L (ref 3–11)
BILIRUBIN TOTAL: 0.25 mg/dL (ref 0.20–1.20)
BUN: 11.4 mg/dL (ref 7.0–26.0)
CO2: 26 mEq/L (ref 22–29)
CREATININE: 1.1 mg/dL (ref 0.6–1.1)
Calcium: 9.6 mg/dL (ref 8.4–10.4)
Chloride: 94 mEq/L — ABNORMAL LOW (ref 98–109)
Glucose: 133 mg/dl (ref 70–140)
Potassium: 3.7 mEq/L (ref 3.5–5.1)
SODIUM: 129 meq/L — AB (ref 136–145)
TOTAL PROTEIN: 7.3 g/dL (ref 6.4–8.3)

## 2013-03-03 LAB — HOLD TUBE, BLOOD BANK

## 2013-03-03 LAB — MAGNESIUM (CC13): Magnesium: 2.1 mg/dl (ref 1.5–2.5)

## 2013-03-03 MED ORDER — HYDROMORPHONE HCL 2 MG PO TABS: 2.0000 mg | ORAL_TABLET | ORAL | Status: DC | PRN

## 2013-03-03 MED ORDER — FENTANYL 25 MCG/HR TD PT72: 25.0000 ug | MEDICATED_PATCH | TRANSDERMAL | Status: DC

## 2013-03-03 MED ORDER — LORAZEPAM 1 MG PO TABS: ORAL_TABLET | ORAL | Status: DC

## 2013-03-03 NOTE — Progress Notes (Signed)
Clinical Social Work Theatre manager met with Patient per request of Futures trader.  Patient was accompanied by a friend. CSWI met with Patient to discuss psychosocial needs and concerns. Currently Patient has a roommate that requires care given by Patient. Patient is currently unable to provide as much physical care as necessary for roommate and has anxiety towards situation.  CSWI acknowledged Patient's feelings.  Friend is working with Patient to organize alternative plans for Patient's roommate.  Friend indicated an immediate plan for roommate's family to assist in finding alternative care for roommate.  Patient confirmed Friend's information and that a meeting was scheduled for this weekend hoping to facilitate changes for roommate. Patient and Friend voiced possible alternative plans as well.  Both Patient and Friend expressed determination to resolve roommate care issues.    Patient shared she had made arrangements financially so at this time she does not have to return to work.  Patient also indicated work disability would begin after 60 days of not working.  Patient seemed confident that financially she will be able to remain home for her care at this time.   Patient understands her need for self-care and is taking the necessary steps towards changes.  Friend indicated her support for Patient.  CSWI shared written support group information.  Patient indicated she might try to participate.  CSWI again advised Patient of support available through Patient and Family Support Services and encouraged Patient to contact [facility] if further assistance was needed.  Tavone Caesar S. [place] Work Intern [employer] [phone]

## 2013-03-03 NOTE — Patient Instructions (Signed)
Start duragesic (fentanyl patch) 25 micrograms every 72 hours. It will take 24 -36 hours after you start the patch before you can tell that you have added this slow release pain medicine. Continue dilaudid 2-4 mg every 4 hours as needed with this.  Adjust imodium and miralax/ stool softener daily based on what your bowels are doing. Stop laxative/ stool softener if loose stools.  You will have chemo on 2-23, so drink all of the liquids night before.

## 2013-03-04 ENCOUNTER — Encounter: Payer: Self-pay | Admitting: Oncology

## 2013-03-04 DIAGNOSIS — D500 Iron deficiency anemia secondary to blood loss (chronic): Secondary | ICD-10-CM | POA: Insufficient documentation

## 2013-03-04 DIAGNOSIS — E871 Hypo-osmolality and hyponatremia: Secondary | ICD-10-CM | POA: Insufficient documentation

## 2013-03-04 NOTE — Progress Notes (Signed)
OFFICE PROGRESS NOTE   03/04/2013   Physicians::D.ClarkePearson,J.Kinard, C.Melinda Crutch (PCP), Roney Mans, Louis Meckel ([facility])   INTERVAL HISTORY:   Patient seen, together with RN friend Amy, in continuing attention to treatment in process with sensitizing CDDP and radiation for IIIB squamous cell carcinoma of cervix. Start of chemotherapy was delayed until 02-20-13 as she developed bilateral hydronephrosis requiring stent placement 02-16-13. Chemo had to be held 2-216-15 due to severe pain from stents and urinary retention with associated dehydration; hemoglobin then was down to 9.6 and she was transfused one unit PRBCs to keep >=10 for RT. She was seen back by Dr Louis Meckel, with trospium DCd and now on Flomax.RT began 02-14-13, planned thru 03-17-13. She has had intermittent diarrhea and continuous slight vaginal bleeding; she continues oral iron and is glad that she is no longer constipated, but knows to decrease laxatives and add imodium if frank diarrhea. She has started pushing gatorade. She needs dilaudid every 4 hours RTC for pain now in low pelvis bilaterally (back pain resolved with stents); we have discussed adding duragesic patch. She has been comfortable enough to sleep some in past 48 hours, including after one Flexeril. Appetite is poor, [place] involved. Plans are in process for more assistance with care of housemate, [place] social worker involved.   She does not have PAC. She has bilateral JJ stents.   ONCOLOGIC HISTORY Patient presented with heavy vaginal bleeding, with low back pain and right hip pain. She was seen by Dr  Dellis Filbert, with biopsies from upper vagina and lower anterior vagina showing invasive squamous cell carcinoma. She had CT CAP in [facility] 01-25-13 which showed necrotic mass centered in lower uterine segment and cervix 10.8 x 8.7 cm, left hydronephrosis, pelvic and retroperitoneal node involvement, rectum and sigmoid intimately associated with pelvic mass but  without obstruction, advanced degenerative disc disease at lumbosacral junction. She saw Dr Josephina Shih 01-27-13, with cervix replaced by fungating tumor extending to both pelvic sidewalls and nodular lesion halfway down anterior vaginal wall. Right hip xray 01-27-13 showed no bony mets. PET 02-10-13 at [facility] had new right hydronephrosis in addition to the previous left hydronephrosis, uptake in para-aortic nodes, left iliac nodes, bulky cervical mass, adenopathy adjacent to rectum; lungs and bones ok by PET. Bilateral JJ stents were placed 02-17-12. First CDDP was given 02-20-13. Chemo held 2-16 with acute pain and inadequate hydration; transfused 1 unit PRBCs 2-16 for Hgb 9.6.     Review of systems as above, also: No fever or clear symptoms of infection. Nausea improved. No LE swelling. No gross hematuria. Remainder of 10 point Review of Systems negative.  Objective:  Vital signs in last 24 hours:  BP 119/74  Pulse 111  Temp(Src) 98.3 F (36.8 C) (Oral)  Resp 18  Ht 5\' 7"  (1.702 m)  Wt 185 lb (83.915 kg)  BMI 28.97 kg/m2  LMP 02/14/2013  Alert, oriented and appropriate, clearly more comfortable. Ambulatory without assistance, able to change position on exam table.  No alopecia  HEENT:PERRL, sclerae not icteric. Oral mucosa moist without lesions, posterior pharynx clear.  Neck supple. No JVD.  Lymphatics:no cervical or right supraclavicular adenopathy, fullness left supraclavicular. No palpable inguinal nodes. Resp: clear to auscultation bilaterally and normal percussion bilaterally Cardio: regular rate and rhythm. No gallop. GI: soft, nontender, not distended, no mass or organomegaly. Some bowel sounds.  Musculoskeletal/ Extremities: without pitting edema, cords, tenderness Neuro: no peripheral neuropathy. Otherwise nonfocal Skin without rash, ecchymosis, petechiae No central catheter  Lab Results:  Results for orders placed in visit on 03/03/13  COMPREHENSIVE METABOLIC PANEL  (ZO10)      Result Value Ref Range   Sodium 129 (*) 136 - 145 mEq/L   Potassium 3.7  3.5 - 5.1 mEq/L   Chloride 94 (*) 98 - 109 mEq/L   CO2 26  22 - 29 mEq/L   Glucose 133  70 - 140 mg/dl   BUN 11.4  7.0 - 26.0 mg/dL   Creatinine 1.1  0.6 - 1.1 mg/dL   Total Bilirubin 0.25  0.20 - 1.20 mg/dL   Alkaline Phosphatase 82  40 - 150 U/L   AST 14  5 - 34 U/L   ALT <6  0 - 55 U/L   Total Protein 7.3  6.4 - 8.3 g/dL   Albumin 2.4 (*) 3.5 - 5.0 g/dL   Calcium 9.6  8.4 - 10.4 mg/dL   Anion Gap 10  3 - 11 mEq/L  MAGNESIUM (CC13)      Result Value Ref Range   Magnesium 2.1  1.5 - 2.5 mg/dl  CBC WITH DIFFERENTIAL      Result Value Ref Range   WBC 8.5  3.9 - 10.3 10e3/uL   NEUT# 7.8 (*) 1.5 - 6.5 10e3/uL   HGB 10.8 (*) 11.6 - 15.9 g/dL   HCT 32.9 (*) 34.8 - 46.6 %   Platelets 255  145 - 400 10e3/uL   MCV 86.4  79.5 - 101.0 fL   MCH 28.3  25.1 - 34.0 pg   MCHC 32.8  31.5 - 36.0 g/dL   RBC 3.81  3.70 - 5.45 10e6/uL   RDW 14.3  11.2 - 14.5 %   lymph# 0.2 (*) 0.9 - 3.3 10e3/uL   MONO# 0.4  0.1 - 0.9 10e3/uL   Eosinophils Absolute 0.1  0.0 - 0.5 10e3/uL   Basophils Absolute 0.0  0.0 - 0.1 10e3/uL   NEUT% 91.7 (*) 38.4 - 76.8 %   LYMPH% 2.8 (*) 14.0 - 49.7 %   MONO% 4.7  0.0 - 14.0 %   EOS% 0.6  0.0 - 7.0 %   BASO% 0.2  0.0 - 2.0 %   nRBC 0  0 - 0 %  HOLD TUBE, BLOOD BANK      Result Value Ref Range   Hold Tube, Blood Bank Blood Bank Order Cancelled      CBC reviewed with patient at visit. Chemistries available after visit, which MD discussed with patient by phone, hyponatremia and hypochloremia likely from diarrhea and free water replacement, with patient to push gatorade thru weekend and will repeat BMET with chemo on 2-23.  Studies/Results:  No results found.  Medications: I have reviewed the patient's current medications. Will begin duragesic 25 mcg q 72 hrs with prn dilaudid. Continue Flomax (no trospium). Adjust laxatives depending on diarrhea. Push gatorade in preference to  water.  DISCUSSION: She will have #2 sensitizing CDDP on 2-23. She will see PA with CMET and Mg on 2-27, then CBC with #3 CDDP on 03-13-13. Pharmacy is confirming cisplatin dosing based on chemistries done since each prior treatment. Chemo should be held if Cornelia <1.5 or plt <100k. She needs PRBCs if hgb <10 while RT still in progress. As RT completes on 03-17-13, the CDDP on 3-2 will be last of those treatments. I will see her next on 3-11. Dr Sondra Come anticipates additional brachytherapy, possibly at [facility].  I expect that she will see Dr Josephina Shih after RT completes, and may need further chemotherapy then.  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 External beam RT with weekly sensitizing CDDP to complete 03-17-13, then brachytherapy. Chemo information as in discussion above. Add duragesic. 2.JJ stents for bilateral ureteral obstruction. Appreciate assistance from Dr Louis Meckel.  3.iron deficiency anemia related to vaginal bleeding. Need to keep hgb >10 during this RT. On oral iron. Transfused 1 unit PRBCs 02-27-13.  4.never colonoscopy/ ? if mammograms  5.flu vaccine done  6.Social situation: she is not able to continue as primary caregiver for severely physically handicapped housemate. Additional plans being addressed. 7.hyponatremia and hypochloremia today: push gatorade, address RT diarrhea, repeat chemistries on 2-23. In spite of this, she looks much better overall today. Prefer NS with K/Mg to D5/0.45 NS with chemo 2-23 -- note to pharmacy in chemo orders.       LIVESAY,LENNIS P, MD   03/04/2013, 12:06 PM

## 2013-03-06 ENCOUNTER — Other Ambulatory Visit (HOSPITAL_BASED_OUTPATIENT_CLINIC_OR_DEPARTMENT_OTHER): Payer: BC Managed Care – PPO

## 2013-03-06 ENCOUNTER — Ambulatory Visit (HOSPITAL_BASED_OUTPATIENT_CLINIC_OR_DEPARTMENT_OTHER): Payer: BC Managed Care – PPO

## 2013-03-06 ENCOUNTER — Ambulatory Visit
Admission: RE | Admit: 2013-03-06 | Discharge: 2013-03-06 | Disposition: A | Discharge status: Home / Self Care | Payer: BC Managed Care – PPO | Source: Ambulatory Visit | Attending: Radiation Oncology | Admitting: Radiation Oncology

## 2013-03-06 ENCOUNTER — Ambulatory Visit: Payer: BC Managed Care – PPO | Admitting: Nutrition

## 2013-03-06 ENCOUNTER — Other Ambulatory Visit: Payer: Self-pay | Admitting: Oncology

## 2013-03-06 ENCOUNTER — Other Ambulatory Visit: Payer: Self-pay

## 2013-03-06 VITALS — BP 122/76 | HR 99 | Temp 98.2°F

## 2013-03-06 DIAGNOSIS — Z5111 Encounter for antineoplastic chemotherapy: Secondary | ICD-10-CM

## 2013-03-06 DIAGNOSIS — E871 Hypo-osmolality and hyponatremia: Secondary | ICD-10-CM

## 2013-03-06 DIAGNOSIS — C539 Malignant neoplasm of cervix uteri, unspecified: Secondary | ICD-10-CM

## 2013-03-06 DIAGNOSIS — N1330 Unspecified hydronephrosis: Secondary | ICD-10-CM

## 2013-03-06 LAB — CBC WITH DIFFERENTIAL/PLATELET
BASO%: 0.2 % (ref 0.0–2.0)
Basophils Absolute: 0 10*3/uL (ref 0.0–0.1)
EOS%: 0.8 % (ref 0.0–7.0)
Eosinophils Absolute: 0.1 10*3/uL (ref 0.0–0.5)
HCT: 29.6 % — ABNORMAL LOW (ref 34.8–46.6)
HGB: 9.8 g/dL — ABNORMAL LOW (ref 11.6–15.9)
LYMPH%: 2.7 % — ABNORMAL LOW (ref 14.0–49.7)
MCH: 28 pg (ref 25.1–34.0)
MCHC: 33.1 g/dL (ref 31.5–36.0)
MCV: 84.6 fL (ref 79.5–101.0)
MONO#: 0.6 10*3/uL (ref 0.1–0.9)
MONO%: 9.2 % (ref 0.0–14.0)
NEUT#: 5.8 10*3/uL (ref 1.5–6.5)
NEUT%: 87.1 % — ABNORMAL HIGH (ref 38.4–76.8)
Platelets: 234 10*3/uL (ref 145–400)
RBC: 3.5 10*6/uL — ABNORMAL LOW (ref 3.70–5.45)
RDW: 14 % (ref 11.2–14.5)
WBC: 6.6 10*3/uL (ref 3.9–10.3)
lymph#: 0.2 10*3/uL — ABNORMAL LOW (ref 0.9–3.3)

## 2013-03-06 LAB — BASIC METABOLIC PANEL (CC13)
Anion Gap: 11 mEq/L (ref 3–11)
BUN: 11 mg/dL (ref 7.0–26.0)
CO2: 25 mEq/L (ref 22–29)
Calcium: 9.6 mg/dL (ref 8.4–10.4)
Chloride: 91 mEq/L — ABNORMAL LOW (ref 98–109)
Creatinine: 1.1 mg/dL (ref 0.6–1.1)
Glucose: 124 mg/dl (ref 70–140)
Potassium: 4 mEq/L (ref 3.5–5.1)
Sodium: 127 mEq/L — ABNORMAL LOW (ref 136–145)

## 2013-03-06 LAB — PREPARE RBC (CROSSMATCH)

## 2013-03-06 MED ORDER — LORAZEPAM 1 MG PO TABS
1.0000 mg | ORAL_TABLET | Freq: Once | ORAL | Status: AC | PRN
  Administered 2013-03-06: 1 mg via ORAL

## 2013-03-06 MED ORDER — LORAZEPAM 1 MG PO TABS
ORAL_TABLET | ORAL | Status: AC
  Filled 2013-03-06: qty 1

## 2013-03-06 MED ORDER — DEXAMETHASONE SODIUM PHOSPHATE 20 MG/5ML IJ SOLN
INTRAMUSCULAR | Status: AC
  Filled 2013-03-06: qty 5

## 2013-03-06 MED ORDER — ONDANSETRON 16 MG/50ML IVPB (CHCC)
16.0000 mg | Freq: Once | INTRAVENOUS | Status: AC
  Administered 2013-03-06: 16 mg via INTRAVENOUS

## 2013-03-06 MED ORDER — SODIUM CHLORIDE 0.9 % IV SOLN
500.0000 mL | INTRAVENOUS | Status: DC
  Administered 2013-03-06: 500 mL via INTRAVENOUS

## 2013-03-06 MED ORDER — ONDANSETRON 16 MG/50ML IVPB (CHCC)
INTRAVENOUS | Status: AC
  Filled 2013-03-06: qty 16

## 2013-03-06 MED ORDER — SODIUM CHLORIDE 0.9 % IV SOLN
Freq: Once | INTRAVENOUS | Status: AC
  Administered 2013-03-06: 11:00:00 via INTRAVENOUS
  Filled 2013-03-06: qty 10

## 2013-03-06 MED ORDER — SODIUM CHLORIDE 0.9 % IV SOLN
40.0000 mg/m2 | Freq: Once | INTRAVENOUS | Status: AC
  Administered 2013-03-06: 84 mg via INTRAVENOUS
  Filled 2013-03-06: qty 84

## 2013-03-06 MED ORDER — SODIUM CHLORIDE 0.9 % IV SOLN
Freq: Once | INTRAVENOUS | Status: AC
  Administered 2013-03-06: 13:00:00 via INTRAVENOUS

## 2013-03-06 MED ORDER — SODIUM CHLORIDE 0.9 % IV SOLN
150.0000 mg | Freq: Once | INTRAVENOUS | Status: AC
  Administered 2013-03-06: 150 mg via INTRAVENOUS
  Filled 2013-03-06: qty 5

## 2013-03-06 MED ORDER — HYDROMORPHONE HCL PF 4 MG/ML IJ SOLN
INTRAMUSCULAR | Status: AC
  Filled 2013-03-06: qty 1

## 2013-03-06 MED ORDER — HYDROMORPHONE HCL PF 4 MG/ML IJ SOLN
2.0000 mg | INTRAMUSCULAR | Status: DC | PRN
  Administered 2013-03-06: 2 mg via INTRAVENOUS

## 2013-03-06 MED ORDER — DEXAMETHASONE SODIUM PHOSPHATE 20 MG/5ML IJ SOLN
12.0000 mg | Freq: Once | INTRAMUSCULAR | Status: AC
  Administered 2013-03-06: 12 mg via INTRAVENOUS

## 2013-03-06 NOTE — Progress Notes (Signed)
58 year old female diagnosed with cervical cancer.  She is a patient of Dr. Marko Plume.  Past medical history includes iron deficiency anemia, bilateral hydronephrosis, asthma, and chronic constipation.  Medications include Dulcolax, vitamin D, Ativan, Zofran, and MiraLax.  Labs include: Sodium 132, creatinine 1.2, albumin 2.8 on February 13.  Height: 67 inches. Weight: 185 pounds on February 20. Usual body weight: 206 pounds January 2015. BMI: 28.97.  Patient reports her pain is becoming better controlled.  She is trying to increase her oral intake.  She is positive for a 21 pound weight loss.  She does not care for most nutrition supplements but is willing to try samples, and recipes to improve taste.  Nutrition diagnosis: Food and nutrition related knowledge deficit related to diagnosis of cervical cancer and associated treatments as evidenced by no prior need for nutrition related information.  Intervention: Patient was educated to consume small, frequent meals and snacks using, higher calorie, higher protein foods.  I've educated patient on strategies for increasing calories in foods she does enjoy.  I've educated her on strategies for eating if nausea occurs.  I provided recipes and education on oral nutrition supplements.  I've given her fact sheets and coupons.  I've also provided her with samples of oral nutrition supplements to take.  Teach back method used.  Monitoring, evaluation, goals: Patient will tolerate increased oral intake and oral nutrition supplements to minimize further weight loss.  Next visit: Monday, March 2, during chemotherapy.

## 2013-03-06 NOTE — Patient Instructions (Signed)
[facility] Discharge Instructions for Patients Receiving Chemotherapy  Today you received the following chemotherapy agents Cisplatin To help prevent nausea and vomiting after your treatment, we encourage you to take your nausea medication as directed/prescribed    If you develop nausea and vomiting that is not controlled by your nausea medication, call the clinic.   BELOW ARE SYMPTOMS THAT SHOULD BE REPORTED IMMEDIATELY:  *FEVER GREATER THAN 100.5 F  *CHILLS WITH OR WITHOUT FEVER  NAUSEA AND VOMITING THAT IS NOT CONTROLLED WITH YOUR NAUSEA MEDICATION  *UNUSUAL SHORTNESS OF BREATH  *UNUSUAL BRUISING OR BLEEDING  TENDERNESS IN MOUTH AND THROAT WITH OR WITHOUT PRESENCE OF ULCERS  *URINARY PROBLEMS  *BOWEL PROBLEMS  UNUSUAL RASH Items with * indicate a potential emergency and should be followed up as soon as possible.  Feel free to call the clinic you have any questions or concerns. The clinic phone number is [phone].    

## 2013-03-07 ENCOUNTER — Ambulatory Visit
Admission: RE | Admit: 2013-03-07 | Discharge: 2013-03-07 | Disposition: A | Discharge status: Home / Self Care | Payer: BC Managed Care – PPO | Source: Ambulatory Visit | Attending: Radiation Oncology | Admitting: Radiation Oncology

## 2013-03-07 VITALS — BP 127/74 | HR 79 | Temp 94.4°F | Ht 67.0 in | Wt 186.6 lb

## 2013-03-07 DIAGNOSIS — C539 Malignant neoplasm of cervix uteri, unspecified: Secondary | ICD-10-CM

## 2013-03-07 NOTE — Progress Notes (Signed)
[facility]     Rexene Edison, M.D. [place], [place] [address]               Blair Promise, M.D., Ph.D. Phone: [phone]      [place] A. Tammi Klippel, M.D. Fax: [phone]      Jodelle Gross, M.D., Ph.D.         Thea Silversmith, M.D.         Wyvonnia Lora, M.D Weekly Treatment Management Note  Name: Gina Barnett     MRN: [id number]        CSN: [id number] Date: 03/07/2013      DOB: 02/21/1955  CC:  Melinda Crutch, MD         Harrington Challenger     Status: Outpatient  Diagnosis: Clinical stage III-B invasive squamous cell carcinoma cervix with bilateral hydronephrosis, vaginal vault involvement, periaortic external iliac and perirectal involvement  Current Dose: 30.6 Gy  Current Fraction: 17  Planned Dose: 45 Gy  Narrative: Gina Barnett was seen today for weekly treatment management. The chart was checked and MVCT  were reviewed. She seems to be doing a little better this week. Her pain overall has improved. She is considering decreasing her Dilaudid dosing.  She denies any significant vaginal bleeding but continues to have vaginal discharge which she wears a pad. Patient has occasional hematuria related to her stent. she is not having any problems with diarrhea at this time.  Latex and Penicillins Current Outpatient Prescriptions  Medication Sig Dispense Refill  . albuterol (PROVENTIL HFA;VENTOLIN HFA) 108 (90 BASE) MCG/ACT inhaler Inhale into the lungs every 6 (six) hours as needed for wheezing or shortness of breath.      . Cholecalciferol (VITAMIN D3) 5000 UNITS CAPS Take 1 capsule by mouth daily.       . ciprofloxacin (CIPRO) 250 MG tablet Take 250 mg by mouth 2 (two) times daily.      . clobetasol cream (TEMOVATE) 5.32 % Apply 1 application topically 2 (two) times daily as needed.       . fentaNYL (DURAGESIC - DOSED MCG/HR) 25 MCG/HR patch Place 1 patch (25 mcg total) onto the skin every 3 (three) days.  5 patch  0  . ferrous  fumarate (HEMOCYTE) 325 (106 FE) MG TABS tablet Take 1 tablet twice a day on an empty stomach with OJ  60 each  2  . fexofenadine (ALLEGRA) 180 MG tablet Take 180 mg by mouth every morning.      Marland Kitchen HYDROmorphone (DILAUDID) 2 MG tablet Take 1-2 tablets (2-4 mg total) by mouth every 4 (four) hours as needed for moderate pain.  60 tablet  0  . LORazepam (ATIVAN) 1 MG tablet Take 1 tablet under tongue or swallow every 6 hours as needed for nausea. Will make drowsy.  30 tablet  0  . tamsulosin (FLOMAX) 0.4 MG CAPS capsule Take 0.4 mg by mouth.      . triamcinolone cream (KENALOG) 0.1 % Apply 1 application topically as needed.      . bisacodyl (DULCOLAX) 5 MG EC tablet Take 5 mg by mouth daily as needed for moderate constipation.      . Budesonide (PULMICORT IN) Inhale 2 puffs into the lungs every evening.       . ondansetron (ZOFRAN) 8 MG tablet Take 1-2 tablets (8-16 mg total) by mouth every 12 (twelve) hours as needed for nausea or vomiting.  30 tablet  1  . polyethylene glycol (MIRALAX / GLYCOLAX) packet Take 17 g by mouth daily.       No current facility-administered medications for this encounter.   Labs:  Lab Results  Component Value Date   WBC 6.6 03/06/2013   HGB 9.8* 03/06/2013   HCT 29.6* 03/06/2013   MCV 84.6 03/06/2013   PLT 234 03/06/2013   Lab Results  Component Value Date   CREATININE 1.1 03/06/2013   BUN 11.0 03/06/2013   NA 127* 03/06/2013   K 4.0 03/06/2013   CO2 25 03/06/2013   Lab Results  Component Value Date   ALT <6 03/03/2013   AST 14 03/03/2013   BILITOT 0.25 03/03/2013    Physical Examination:  Filed Vitals:   03/07/13 1112  BP: 127/74  Pulse: 79  Temp: 94.4 F (34.7 C)    Wt Readings from Last 3 Encounters:  03/07/13 186 lb 9.6 oz (84.641 kg)  03/03/13 185 lb (83.915 kg)  02/28/13 186 lb 11.2 oz (84.687 kg)     Lungs - Normal respiratory effort, chest expands symmetrically. Lungs are clear to auscultation, no crackles or wheezes.  Heart has regular rhythm  and rate  Abdomen is soft and non tender with normal bowel sounds  Assessment:  Patient tolerating treatments well  Plan: Continue treatment per original radiation prescription

## 2013-03-07 NOTE — Progress Notes (Addendum)
Gina Barnett has had 17 fractions to her pelvis.  She is rating her pain at a 2/10 today in her lower abdomen radiating around to her lower back.  She is currently using 2 (25 mcg) fentanyl patches and 2 tablets of dilaudid every 4 hours prn.  She had chemotherapy yesterday.  She reports that she has difficulty urinating before she takes the Flomax.  She reports blood in her urine which she says is normal due to her stents.  She reports that she is not having diarrhea.  She has fatigue but it is better today.  She reports her appetite is OK today.  She denies nausea.  She denies any skin irritation in the treatment area.

## 2013-03-08 ENCOUNTER — Telehealth: Payer: Self-pay | Admitting: Internal Medicine

## 2013-03-08 ENCOUNTER — Ambulatory Visit
Admission: RE | Admit: 2013-03-08 | Discharge: 2013-03-08 | Disposition: A | Discharge status: Home / Self Care | Payer: BC Managed Care – PPO | Source: Ambulatory Visit | Attending: Radiation Oncology | Admitting: Radiation Oncology

## 2013-03-08 ENCOUNTER — Inpatient Hospital Stay (HOSPITAL_COMMUNITY)
Admission: AD | Admit: 2013-03-08 | Payer: BC Managed Care – PPO | Source: Ambulatory Visit | Admitting: Hematology and Oncology

## 2013-03-08 ENCOUNTER — Ambulatory Visit (HOSPITAL_BASED_OUTPATIENT_CLINIC_OR_DEPARTMENT_OTHER): Payer: BC Managed Care – PPO

## 2013-03-08 ENCOUNTER — Ambulatory Visit (HOSPITAL_BASED_OUTPATIENT_CLINIC_OR_DEPARTMENT_OTHER): Payer: BC Managed Care – PPO | Admitting: Physician Assistant

## 2013-03-08 ENCOUNTER — Other Ambulatory Visit: Payer: Self-pay

## 2013-03-08 VITALS — BP 119/74 | HR 99 | Temp 97.7°F | Resp 20 | Ht 67.0 in | Wt 188.5 lb

## 2013-03-08 DIAGNOSIS — C539 Malignant neoplasm of cervix uteri, unspecified: Secondary | ICD-10-CM

## 2013-03-08 DIAGNOSIS — E871 Hypo-osmolality and hyponatremia: Secondary | ICD-10-CM

## 2013-03-08 DIAGNOSIS — N1330 Unspecified hydronephrosis: Secondary | ICD-10-CM

## 2013-03-08 DIAGNOSIS — D500 Iron deficiency anemia secondary to blood loss (chronic): Secondary | ICD-10-CM

## 2013-03-08 DIAGNOSIS — N898 Other specified noninflammatory disorders of vagina: Secondary | ICD-10-CM

## 2013-03-08 LAB — URINALYSIS, MICROSCOPIC - CHCC
BILIRUBIN (URINE): NEGATIVE
Glucose: NEGATIVE mg/dL
Ketones: NEGATIVE mg/dL
NITRITE: NEGATIVE
PH: 6.5 (ref 4.6–8.0)
PROTEIN: 300 mg/dL
Specific Gravity, Urine: 1.01 (ref 1.003–1.035)
Urobilinogen, UR: 0.2 mg/dL (ref 0.2–1)

## 2013-03-08 MED ORDER — FENTANYL 75 MCG/HR TD PT72: 75.0000 ug | MEDICATED_PATCH | TRANSDERMAL | Status: DC

## 2013-03-08 MED ORDER — FENTANYL 75 MCG/HR TD PT72: MEDICATED_PATCH | TRANSDERMAL | Status: DC

## 2013-03-08 NOTE — Telephone Encounter (Signed)
Add on today per Delos Haring

## 2013-03-08 NOTE — Progress Notes (Signed)
Patient brought urine specimen.  Dr. Louis Meckel wants a UA/C&S done on patient today.

## 2013-03-09 ENCOUNTER — Ambulatory Visit
Admission: RE | Admit: 2013-03-09 | Discharge: 2013-03-09 | Disposition: A | Discharge status: Home / Self Care | Payer: BC Managed Care – PPO | Source: Ambulatory Visit | Attending: Radiation Oncology | Admitting: Radiation Oncology

## 2013-03-09 ENCOUNTER — Encounter: Payer: Self-pay | Admitting: Physician Assistant

## 2013-03-09 LAB — URINE CULTURE

## 2013-03-09 NOTE — Patient Instructions (Signed)
Start the increased Duragesic patch of 75 mcg every 3 days tonight Keep your regular scheduled appointment on 03/10/2013

## 2013-03-09 NOTE — Progress Notes (Signed)
OFFICE PROGRESS NOTE   03/09/2013   Physicians:D.ClarkePearson,J.Kinard, C.Melinda Crutch (PCP), Roney Mans, Louis Meckel ([facility])   INTERVAL HISTORY:  Patient is seen for a work in visit visit due to continued suboptimal pain management severe pain. Currently receiving sensitizing CDDP with RT in process for clinical IIIB cervix cancer. Her by mouth intake remains diminished both of food and fluids. She is currently on a fentanyl patch at 50 mcg every 3 days in addition to Dilaudid 2 mg tablets, 2 tablets every 4-6 hours. She continues to take 5 or 6 doses of "breakthrough" pain medication daily. She continues to have some vaginal bleeding but she believes she also has "started my menses" today. She has her usual menstrual pain as well as the change to loose liver stools which is common for her during her menses. She has nausea but no vomiting. She an effect has a partial small bowel obstruction-type effect due to the pressure of her tumor. She does take MiraLAX which allows the stool to at least push past. She continues to have back pain as well as pain from her bilateral JJ stents. She's also been a bit hyponatremic and is due to have chemistries when she returns for her scheduled visit on 03/10/2013.  She is unable to sleep due to pain and is not taking po's including prehydration for CDDP.  She denies fever but has some intermittent hematuria related to the recently placed JJ stents. She has had loose stools which are also not unexpected with RT. She denies bleeding.   No central catheter.   ONCOLOGIC HISTORY Patient presented with heavy vaginal bleeding, with low back pain and right hip pain. She was seen by Dr Sebastian Ache, with obvious tumor and biopsies from upper vagina and lower anterior vagina showing invasive squamous cell carcinoma. She had CT CAP in [facility] 01-25-13 which showed necrotic mass centered in lower uterine segment and cervix 10.8 x 8.7 cm, left hydronephrosis,  pelvic and retroperitoneal node involvement, rectum and sigmoid intimately associated with pelvic mass but without obstruction, advanced degenerative disc disease at lumbosacral junction. She was referred to Dr Josephina Shih 01-27-13, his exam remarkable for cervix replaced by fungating tumor extending to both pelvic sidewalls and nodular lesion halfway down anterior vaginal wall. Right hip xray 01-27-13 showed no bony mets. PET 02-10-13 at [facility] had new right hydronephrosis, hypermetabolic uptake in para-aortic nodes, left iliac nodes, bulky cervical mass, adenopathy adjacent to rectum, lungs and bones ok. Bilateral JJ stents were placed 02-17-12. First CDDP was given 02-20-13. Chemo held 2-16 with acute pain and inadequate hydration; transfused 1 unit PRBCs 2-16 for Hgb 9.6.  Review of systems as above, also: She remains exhausted as she tries to care for her housemate who has MS. She is also stressed and worried about her own health and inability to get comfortable as well as the financial aspects related to her care. She is planning to reach out to her brother and hopes to be able to go to his house for the weekend for some respite. Her housemate apparently has family members who should be able to step up in step in and care for her the housemate.   Remainder of 10 point Review of Systems negative.  Objective:  Vital signs in last 24 hours:  BP 119/74  Pulse 99  Temp(Src) 97.7 F (36.5 C) (Oral)  Resp 20  Ht 5\' 7"  (1.702 m)  Wt 188 lb 8 oz (85.503 kg)  BMI 29.52 kg/m2  LMP 02/14/2013  Weight is stable   Alert, oriented and appropriate. Able to stand, get in and out chair, as well as onto the exam table with minimal assistance, change position in bed.  HEENT:PERRL, sclerae not icteric. Oral mucosa somewhat day without lesions, posterior pharynx clear.  Neck supple. No JVD.  Lymphatics:no cervical,suraclavicular adenopathy Resp: clear to auscultation bilaterally Cardio: regular rate and  rhythm. No gallop. GI: soft, nontender, not distended, no mass or organomegaly. No rebound or referred pain  Some bowel sounds.  Musculoskeletal/ Extremities: without pitting edema, cords, tenderness Neuro: no peripheral neuropathy. Otherwise nonfocal Skin without rash, ecchymosis, petechiae   Lab Results:  Results for orders placed in visit on 03/08/13  URINALYSIS, MICROSCOPIC - [facility]      Result Value Ref Range   Glucose Negative  Negative mg/dL   Bilirubin (Urine) Negative  Negative   Ketones Negative  Negative mg/dL   Specific Gravity, Urine 1.010  1.003 - 1.035   Blood Large  Negative   pH 6.5  4.6 - 8.0   Protein 300  Negative- <30 mg/dL   Urobilinogen, UR 0.2  0.2 - 1 mg/dL   Nitrite Negative  Negative   Leukocyte Esterase Large  Negative   RBC / HPF 21-50  0 - 2   WBC, UA 11-20  0 - 2   Bacteria, UA Few  Negative- Trace   Epithelial Cells Occasional  Negative- Few   Note creatinine slightly higher on these most recent chemistries.  UA available after visit : large blood, TNTC RBCs, 11-20 WBCs few bacteria, moderate LE. Culture pending.  Studies/Results:  No results found.  Medications: I have reviewed the patient's current medications. She will continue Flomax   DISCUSSION: The patient was discussed both with Dr. Alvy Bimler and Dr. Owens Loffler is a 1 point we are considering admitting the patient for pain control, her management of her hyponatremia and may be considering a blood transfusion as well as letting her continue her radiation therapy all in the specter of the upcoming inclement weather. Overall her level of anemia is stable and she continues to take and tolerate her iron supplement taken twice daily without difficulty. She was asked also take 1000 international units of vitamin B12 as well as over-the-counter folate acid 0.4 mcg 2 tablets daily. Patient tells [place] that she is taking 5000 international units of vitamin D3 daily. We will check a vitamin D level as well as  repeat a CBC differential when she returns on 03/10/2013 for her scheduled followup visit to have chemistries drawn. We will make a determination at that point as to whether to proceed with packed cell transfusion. Regarding her pain management we'll increase her signal patches 75 mcg patch every 3 days. She is given a prescription for 5 patches. In one of the considerations is that the patch may need to be changed every 48 hours instead of every 72 hours. She will continue for now to take her Dilaudid 2 mg tablets 2 tablets by mouth every 4-6 hours as needed for breakthrough pain, however he does hope that the increase in the Duragesic patch she will need fewer doses of breakthrough pain management. Additionally she may benefit from either a 4 mg R. date milligram on Dilaudid tablet in the future.  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 Acute pain and poor hydration today such  that chemo was held. She will have chemistries with magnesium on Fridays and CBCs with chemo on Mondays during chemo. Hemoglobin needs to be kept >=10 during RT. patient return on 2/27 as scheduled for a followup appointment to be seen next by Dr. Marko Plume on 03/22/2013 as previously scheduled.  2.JJ stents placed for bilateral ureteral obstruction. Continues to have pain in this region that is partially down regulated by the frequent breakthrough Dilaudid doses as well as partially ameliorated by the Duragesic 50 mcg patch dose.Present pain seems at least in part related to the stents and we may need additional assistance from urology. 3.iron deficiency anemia related to vaginal bleeding and some blood loss with stents. Need to keep hgb >10 during this RT. Continue oral iron. And over-the-counter vitamin D 12 1000 mineral  milligrams/international units as well as over-the-counter folic acid 0.4 mcg-2 tablets by mouth daily. We will check a repeat CBC differential on 03/10/2013 as well as a vitamin D level. Patient currently takes 5000 international units of vitamin D3 daily and there is a concern that this may be too much.  4.never colonoscopy/ ? 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 This continues to be an issue, however the patient has reached out to the housemate's family members and there may be a another care plan for the housemate in the works that does not depend on Ms. Stille as the primary health caregiver. As previously stated she will reached out to her brother who is offered to help her.   Patient was in agreement with increased Duragesic patch to 75 mcg every 3 days and not being admitted. She will be due for a prescription for her Dilaudid tablets when she returns on 03/10/2013.      Awilda Metro E, PA-C   03/09/2013, 3:13 PM

## 2013-03-10 ENCOUNTER — Other Ambulatory Visit (HOSPITAL_BASED_OUTPATIENT_CLINIC_OR_DEPARTMENT_OTHER): Payer: BC Managed Care – PPO

## 2013-03-10 ENCOUNTER — Encounter: Payer: Self-pay | Admitting: Specialist

## 2013-03-10 ENCOUNTER — Encounter: Payer: Self-pay | Admitting: Physician Assistant

## 2013-03-10 ENCOUNTER — Other Ambulatory Visit: Payer: Self-pay

## 2013-03-10 ENCOUNTER — Ambulatory Visit (HOSPITAL_BASED_OUTPATIENT_CLINIC_OR_DEPARTMENT_OTHER): Payer: BC Managed Care – PPO

## 2013-03-10 ENCOUNTER — Ambulatory Visit
Admission: RE | Admit: 2013-03-10 | Discharge: 2013-03-10 | Disposition: A | Discharge status: Home / Self Care | Payer: BC Managed Care – PPO | Source: Ambulatory Visit | Attending: Radiation Oncology | Admitting: Radiation Oncology

## 2013-03-10 ENCOUNTER — Ambulatory Visit (HOSPITAL_BASED_OUTPATIENT_CLINIC_OR_DEPARTMENT_OTHER): Payer: BC Managed Care – PPO | Admitting: Physician Assistant

## 2013-03-10 VITALS — BP 126/72 | HR 98 | Temp 98.7°F | Resp 18 | Ht 67.0 in | Wt 183.4 lb

## 2013-03-10 VITALS — BP 121/69 | HR 78 | Temp 97.3°F | Resp 18

## 2013-03-10 DIAGNOSIS — E871 Hypo-osmolality and hyponatremia: Secondary | ICD-10-CM

## 2013-03-10 DIAGNOSIS — C539 Malignant neoplasm of cervix uteri, unspecified: Secondary | ICD-10-CM

## 2013-03-10 DIAGNOSIS — D500 Iron deficiency anemia secondary to blood loss (chronic): Secondary | ICD-10-CM

## 2013-03-10 DIAGNOSIS — N1330 Unspecified hydronephrosis: Secondary | ICD-10-CM

## 2013-03-10 DIAGNOSIS — N898 Other specified noninflammatory disorders of vagina: Secondary | ICD-10-CM

## 2013-03-10 LAB — CBC WITH DIFFERENTIAL/PLATELET
BASO%: 0.2 % (ref 0.0–2.0)
BASOS ABS: 0 10*3/uL (ref 0.0–0.1)
EOS ABS: 0 10*3/uL (ref 0.0–0.5)
EOS%: 0.7 % (ref 0.0–7.0)
HCT: 29.9 % — ABNORMAL LOW (ref 34.8–46.6)
HEMOGLOBIN: 9.7 g/dL — AB (ref 11.6–15.9)
LYMPH%: 0.6 % — ABNORMAL LOW (ref 14.0–49.7)
MCH: 28.3 pg (ref 25.1–34.0)
MCHC: 32.6 g/dL (ref 31.5–36.0)
MCV: 86.8 fL (ref 79.5–101.0)
MONO#: 0.4 10*3/uL (ref 0.1–0.9)
MONO%: 5.6 % (ref 0.0–14.0)
NEUT%: 92.9 % — ABNORMAL HIGH (ref 38.4–76.8)
NEUTROS ABS: 6.7 10*3/uL — AB (ref 1.5–6.5)
Platelets: 195 10*3/uL (ref 145–400)
RBC: 3.44 10*6/uL — ABNORMAL LOW (ref 3.70–5.45)
RDW: 14.8 % — AB (ref 11.2–14.5)
WBC: 7.2 10*3/uL (ref 3.9–10.3)
lymph#: 0 10*3/uL — ABNORMAL LOW (ref 0.9–3.3)

## 2013-03-10 LAB — COMPREHENSIVE METABOLIC PANEL (CC13)
ALBUMIN: 2.2 g/dL — AB (ref 3.5–5.0)
ALT: 8 U/L (ref 0–55)
ANION GAP: 10 meq/L (ref 3–11)
AST: 21 U/L (ref 5–34)
Alkaline Phosphatase: 80 U/L (ref 40–150)
BUN: 12.5 mg/dL (ref 7.0–26.0)
CALCIUM: 9.4 mg/dL (ref 8.4–10.4)
CO2: 26 meq/L (ref 22–29)
Chloride: 93 mEq/L — ABNORMAL LOW (ref 98–109)
Creatinine: 1.2 mg/dL — ABNORMAL HIGH (ref 0.6–1.1)
GLUCOSE: 165 mg/dL — AB (ref 70–140)
POTASSIUM: 4.8 meq/L (ref 3.5–5.1)
Sodium: 128 mEq/L — ABNORMAL LOW (ref 136–145)
Total Bilirubin: 0.2 mg/dL (ref 0.20–1.20)
Total Protein: 6.8 g/dL (ref 6.4–8.3)

## 2013-03-10 LAB — TYPE AND SCREEN
ABO/RH(D): B POS
Antibody Screen: NEGATIVE
UNIT DIVISION: 0
Unit division: 0

## 2013-03-10 LAB — PREPARE RBC (CROSSMATCH)

## 2013-03-10 LAB — HOLD TUBE, BLOOD BANK

## 2013-03-10 LAB — MAGNESIUM (CC13): Magnesium: 2 mg/dl (ref 1.5–2.5)

## 2013-03-10 MED ORDER — ACETAMINOPHEN 325 MG PO TABS
650.0000 mg | ORAL_TABLET | Freq: Once | ORAL | Status: AC
  Administered 2013-03-10: 650 mg via ORAL

## 2013-03-10 MED ORDER — SODIUM CHLORIDE 0.9 % IV SOLN
250.0000 mL | Freq: Once | INTRAVENOUS | Status: AC
  Administered 2013-03-10: 250 mL via INTRAVENOUS

## 2013-03-10 MED ORDER — ACETAMINOPHEN 325 MG PO TABS
ORAL_TABLET | ORAL | Status: AC
  Filled 2013-03-10: qty 2

## 2013-03-10 NOTE — Progress Notes (Signed)
OFFICE PROGRESS NOTE   03/10/2013   Physicians:D.ClarkePearson,J.Kinard, C.Melinda Crutch (PCP), Roney Mans, Louis Meckel ([facility])   INTERVAL HISTORY:  Patient is seen for a scheduled visit for follow up of her cervical cancer and reevaluation of her pain management. Currently receiving sensitizing CDDP with RT in process for clinical IIIB cervix cancer. Her by mouth intake is slowly improving,both of food and fluids. I started her on the 75 mcg fentanyl patch on Wednesday and she feels that this is better controlling her pain. She notes a decrease frequency of use of the breakthrough Dilaudid tablets. She has enough Dilaudid tablets to get her through the weekend but will likely need a new prescription when she returns on Monday for labs and chemotherapy. She notes blood in her urine as well as burning with urination. She completed her course of ciprofloxacin. Her urine was tested and was negative for UTI. She now realizes that she probably was not on her menses and the bleeding that she was experiencing was likely related to her cancer. This has diminished somewhat as well. She has nausea but no vomiting. She in effect has a partial small bowel obstruction-type effect due to the pressure of her tumor. She does take MiraLAX which allows the stool to at least push past. She continues to have back pain as well as pain from her bilateral JJ stents. She does report more fatigue.  No central catheter.   ONCOLOGIC HISTORY Patient presented with heavy vaginal bleeding, with low back pain and right hip pain. She was seen by Dr Sebastian Ache, with obvious tumor and biopsies from upper vagina and lower anterior vagina showing invasive squamous cell carcinoma. She had CT CAP in [facility] 01-25-13 which showed necrotic mass centered in lower uterine segment and cervix 10.8 x 8.7 cm, left hydronephrosis, pelvic and retroperitoneal node involvement, rectum and sigmoid intimately associated with pelvic  mass but without obstruction, advanced degenerative disc disease at lumbosacral junction. She was referred to Dr Josephina Shih 01-27-13, his exam remarkable for cervix replaced by fungating tumor extending to both pelvic sidewalls and nodular lesion halfway down anterior vaginal wall. Right hip xray 01-27-13 showed no bony mets. PET 02-10-13 at [facility] had new right hydronephrosis, hypermetabolic uptake in para-aortic nodes, left iliac nodes, bulky cervical mass, adenopathy adjacent to rectum, lungs and bones ok. Bilateral JJ stents were placed 02-17-12. First CDDP was given 02-20-13. Chemo held 2-16 with acute pain and inadequate hydration; transfused 1 unit PRBCs 2-16 for Hgb 9.6.  Review of systems as above, also: She is resting better. She reports to me that her housemate has been placed in a skilled nursing facility at least for the next 30 days. She at least can concentrate on her own health now.   Remainder of 10 point Review of Systems negative.  Objective:  Vital signs in last 24 hours:  BP 126/72  Pulse 98  Temp(Src) 98.7 F (37.1 C) (Oral)  Resp 18  Ht 5\' 7"  (1.702 m)  Wt 183 lb 6.4 oz (83.19 kg)  BMI 28.72 kg/m2  SpO2 97%  LMP 02/14/2013 Weight is stable   Alert, oriented and appropriate. Able to stand, get in and out chair, as well as onto the exam table with minimal assistance, change position in bed. Appears more comfortable today  HEENT:PERRL, sclerae not icteric. Oral mucosa somewhat day without lesions, posterior pharynx clear.  Neck supple. No JVD.  Lymphatics:no cervical,suraclavicular adenopathy Resp: clear to auscultation bilaterally Cardio: regular rate and rhythm. No gallop. GI:  soft, nontender, not distended, no mass or organomegaly. No rebound or referred pain  Some bowel sounds.  Musculoskeletal/ Extremities: without pitting edema, cords, tenderness Neuro: no peripheral neuropathy. Otherwise nonfocal Skin without rash, ecchymosis, petechiae   Lab  Results:  Results for orders placed in visit on 03/10/13  CBC WITH DIFFERENTIAL      Result Value Ref Range   WBC 7.2  3.9 - 10.3 10e3/uL   NEUT# 6.7 (*) 1.5 - 6.5 10e3/uL   HGB 9.7 (*) 11.6 - 15.9 g/dL   HCT 29.9 (*) 34.8 - 46.6 %   Platelets 195  145 - 400 10e3/uL   MCV 86.8  79.5 - 101.0 fL   MCH 28.3  25.1 - 34.0 pg   MCHC 32.6  31.5 - 36.0 g/dL   RBC 3.44 (*) 3.70 - 5.45 10e6/uL   RDW 14.8 (*) 11.2 - 14.5 %   lymph# 0.0 (*) 0.9 - 3.3 10e3/uL   MONO# 0.4  0.1 - 0.9 10e3/uL   Eosinophils Absolute 0.0  0.0 - 0.5 10e3/uL   Basophils Absolute 0.0  0.0 - 0.1 10e3/uL   NEUT% 92.9 (*) 38.4 - 76.8 %   LYMPH% 0.6 (*) 14.0 - 49.7 %   MONO% 5.6  0.0 - 14.0 %   EOS% 0.7  0.0 - 7.0 %   BASO% 0.2  0.0 - 2.0 %  COMPREHENSIVE METABOLIC PANEL (0000000)      Result Value Ref Range   Sodium 128 (*) 136 - 145 mEq/L   Potassium 4.8  3.5 - 5.1 mEq/L   Chloride 93 (*) 98 - 109 mEq/L   CO2 26  22 - 29 mEq/L   Glucose 165 (*) 70 - 140 mg/dl   BUN 12.5  7.0 - 26.0 mg/dL   Creatinine 1.2 (*) 0.6 - 1.1 mg/dL   Total Bilirubin 0.20  0.20 - 1.20 mg/dL   Alkaline Phosphatase 80  40 - 150 U/L   AST 21  5 - 34 U/L   ALT 8  0 - 55 U/L   Total Protein 6.8  6.4 - 8.3 g/dL   Albumin 2.2 (*) 3.5 - 5.0 g/dL   Calcium 9.4  8.4 - 10.4 mg/dL   Anion Gap 10  3 - 11 mEq/L  MAGNESIUM (CC13)      Result Value Ref Range   Magnesium 2.0  1.5 - 2.5 mg/dl  HOLD TUBE, BLOOD BANK      Result Value Ref Range   Hold Tube, Blood Bank Type and Crossmatch Added     Note creatinine slightly higher on these most recent chemistries.   Studies/Results:  No results found.  Medications: I have reviewed the patient's current medications. She will continue Flomax   DISCUSSION: I she will continue with the 75 mcg fentanyl patch every 3 days although alternative every today regimen may be necessary we will keep this in mind for the future. She will continue her 4 mg of Dilaudid every 4-6 hours as needed for breakthrough pain  management. She will be due for a new Dilaudid prescription on 03/13/2013. She will continue with her chemotherapy and radiation therapy as scheduled. Her hemoglobin is trending downward with at 9.7 today and she is a bit symptomatic. A consult of the nose with a preference to keep her hemoglobin of 10 or greater for radiation, to that and no range to give her one unit of packed red blood cells today to address this issue. The patient is in agreement with this  plan. She will touch base on Monday regarding her prescription for Dilaudid tablets. She will followup with Dr. Marko Plume as previously scheduled. The patient was discussed both with Dr. Alvy Bimler and Dr. Owens Loffler is a 1 point we are considering admitting the patient for pain control, her management of her hyponatremia and may be considering a blood transfusion as well as letting her continue her radiation therapy all in the specter of the upcoming inclement weather. Overall her level of anemia is stable and she continues to take and tolerate her iron supplement taken twice daily without difficulty. She was asked also take 1000 international units of vitamin B12 as well as over-the-counter folate acid 0.4 mcg 2 tablets daily. Patient tells [place] that she is taking 5000 international units of vitamin D3 daily. We will check a vitamin D level as well as repeat a CBC differential when she returns on 03/10/2013 for her scheduled followup visit to have chemistries drawn. We will make a determination at that point as to whether to proceed with packed cell transfusion. Regarding her pain management we'll increase her signal patches 75 mcg patch every 3 days. She is given a prescription for 5 patches. In one of the considerations is that the patch may need to be changed every 48 hours instead of every 72 hours. She will continue for now to take her Dilaudid 2 mg tablets 2 tablets by mouth every 4-6 hours as needed for breakthrough pain, however he does hope that the increase  in the Duragesic patch she will need fewer doses of breakthrough pain management. Additionally she may benefit from either a 4 mg R. date milligram on Dilaudid tablet in the future.  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 Acute pain and poor hydration today such that chemo was held. She will have chemistries with magnesium on Fridays and CBCs with chemo on Mondays during chemo. Hemoglobin needs to be kept >=10 during RT. patient return on 2/27 as scheduled for a followup appointment to be seen next by Dr. Marko Plume on 03/22/2013 as previously scheduled.  2.JJ stents placed for bilateral ureteral obstruction. Continues to have pain in this region that is partially down regulated by the frequent breakthrough Dilaudid doses as well as partially ameliorated by the Duragesic 50 mcg patch dose.Present pain seems at least in part related to the stents and we may need additional assistance from urology. 3.iron deficiency anemia related to vaginal bleeding and some blood loss with stents. Need to keep hgb >10 during this RT. Continue oral iron. And over-the-counter vitamin D 12 1000 mineral milligrams/international units as well as over-the-counter folic acid 0.4 mcg-2 tablets by mouth daily. We will check a repeat CBC differential on 03/10/2013 as well as a vitamin D level. Patient currently takes 5000 international units of vitamin D3 daily and there is a concern that this may be too much.  4.never colonoscopy/ ? if mammograms  5.flu vaccine done  6.Social situation: The house mate is in a skilled nursing facility at least for the next 30 days. Ms. Prevette will be able to concentrate on her health. She will reach out to her brother spend the weekend with him.Patient was in agreement with increased Duragesic patch  to 75 mcg every 3 days and not being admitted. She will be due for a prescription for her Dilaudid tablets when she returns  on 03/10/2013. 7. History of low vitamin D level - Her current vitamin D level is 66 well within normal range, patient is advised to decrease her vitamin D3 to 5000  international units daily Monday through Friday only, skipping the weekends. Patient voiced understanding.  We'll touch bases patient on Monday, 03/13/2013 a she will need a new prescription for her Dilaudid tablets. I will likely change her to a 4 mg tablet. He will make further adjustments to her fentanyl patches as needed.      Carlton Adam, PA-C   03/10/2013, 3:54 PM

## 2013-03-10 NOTE — Progress Notes (Signed)
Lengthy talk with patient. Encouraged self-care; facilitated patient's identifying and connecting to emotional needs and resources; provided listening presence and opportunity for patient to process her narrative of illness (as a patient) and caregiving (as she has cared for others even at the expense of her own health).  Epifania Gore, [facility], PhD

## 2013-03-10 NOTE — Patient Instructions (Signed)
Blood Transfusion Information WHAT IS A BLOOD TRANSFUSION? A transfusion is the replacement of blood or some of its parts. Blood is made up of multiple cells which provide different functions.  Red blood cells carry oxygen and are used for blood loss replacement.  White blood cells fight against infection.  Platelets control bleeding.  Plasma helps clot blood.  Other blood products are available for specialized needs, such as hemophilia or other clotting disorders. BEFORE THE TRANSFUSION  Who gives blood for transfusions?   You may be able to donate blood to be used at a later date on yourself (autologous donation).  Relatives can be asked to donate blood. This is generally not any safer than if you have received blood from a stranger. The same precautions are taken to ensure safety when a relative's blood is donated.  Healthy volunteers who are fully evaluated to make sure their blood is safe. This is blood bank blood. Transfusion therapy is the safest it has ever been in the practice of medicine. Before blood is taken from a donor, a complete history is taken to make sure that person has no history of diseases nor engages in risky social behavior (examples are intravenous drug use or sexual activity with multiple partners). The donor's travel history is screened to minimize risk of transmitting infections, such as malaria. The donated blood is tested for signs of infectious diseases, such as HIV and hepatitis. The blood is then tested to be sure it is compatible with you in order to minimize the chance of a transfusion reaction. If you or a relative donates blood, this is often done in anticipation of surgery and is not appropriate for emergency situations. It takes many days to process the donated blood. RISKS AND COMPLICATIONS Although transfusion therapy is very safe and saves many lives, the main dangers of transfusion include:   Getting an infectious disease.  Developing a  transfusion reaction. This is an allergic reaction to something in the blood you were given. Every precaution is taken to prevent this. The decision to have a blood transfusion has been considered carefully by your caregiver before blood is given. Blood is not given unless the benefits outweigh the risks. AFTER THE TRANSFUSION  Right after receiving a blood transfusion, you will usually feel much better and more energetic. This is especially true if your red blood cells have gotten low (anemic). The transfusion raises the level of the red blood cells which carry oxygen, and this usually causes an energy increase.  The nurse administering the transfusion will monitor you carefully for complications. HOME CARE INSTRUCTIONS  No special instructions are needed after a transfusion. You may find your energy is better. Speak with your caregiver about any limitations on activity for underlying diseases you may have. SEEK MEDICAL CARE IF:   Your condition is not improving after your transfusion.  You develop redness or irritation at the intravenous (IV) site. SEEK IMMEDIATE MEDICAL CARE IF:  Any of the following symptoms occur over the next 12 hours:  Shaking chills.  You have a temperature by mouth above 102 F (38.9 C), not controlled by medicine.  Chest, back, or muscle pain.  People around you feel you are not acting correctly or are confused.  Shortness of breath or difficulty breathing.  Dizziness and fainting.  You get a rash or develop hives.  You have a decrease in urine output.  Your urine turns a dark color or changes to pink, red, or brown. Any of the following   symptoms occur over the next 10 days:  You have a temperature by mouth above 102 F (38.9 C), not controlled by medicine.  Shortness of breath.  Weakness after normal activity.  The white part of the eye turns yellow (jaundice).  You have a decrease in the amount of urine or are urinating less often.  Your  urine turns a dark color or changes to pink, red, or brown. Document Released: 12/27/1999 Document Revised: 03/23/2011 Document Reviewed: 08/15/2007 Exit[employer] Patient Information 2014 [employer].  

## 2013-03-11 LAB — VITAMIN D 25 HYDROXY (VIT D DEFICIENCY, FRACTURES): Vit D, 25-Hydroxy: 66 ng/mL (ref 30–89)

## 2013-03-13 ENCOUNTER — Ambulatory Visit: Payer: BC Managed Care – PPO

## 2013-03-13 ENCOUNTER — Encounter: Payer: BC Managed Care – PPO | Admitting: Nutrition

## 2013-03-13 ENCOUNTER — Ambulatory Visit
Admission: RE | Admit: 2013-03-13 | Discharge: 2013-03-13 | Disposition: A | Discharge status: Home / Self Care | Payer: BC Managed Care – PPO | Source: Ambulatory Visit | Attending: Radiation Oncology | Admitting: Radiation Oncology

## 2013-03-13 ENCOUNTER — Other Ambulatory Visit: Payer: Self-pay | Admitting: Physician Assistant

## 2013-03-13 ENCOUNTER — Ambulatory Visit: Payer: BC Managed Care – PPO | Admitting: Nutrition

## 2013-03-13 ENCOUNTER — Other Ambulatory Visit (HOSPITAL_BASED_OUTPATIENT_CLINIC_OR_DEPARTMENT_OTHER): Payer: BC Managed Care – PPO

## 2013-03-13 DIAGNOSIS — C539 Malignant neoplasm of cervix uteri, unspecified: Secondary | ICD-10-CM

## 2013-03-13 DIAGNOSIS — N1330 Unspecified hydronephrosis: Secondary | ICD-10-CM

## 2013-03-13 LAB — TYPE AND SCREEN
ABO/RH(D): B POS
Antibody Screen: NEGATIVE
UNIT DIVISION: 0

## 2013-03-13 LAB — CBC WITH DIFFERENTIAL/PLATELET
BASO%: 0.1 % (ref 0.0–2.0)
Basophils Absolute: 0 10*3/uL (ref 0.0–0.1)
EOS%: 1 % (ref 0.0–7.0)
Eosinophils Absolute: 0.1 10*3/uL (ref 0.0–0.5)
HCT: 34.6 % — ABNORMAL LOW (ref 34.8–46.6)
HGB: 11.4 g/dL — ABNORMAL LOW (ref 11.6–15.9)
LYMPH%: 3.3 % — AB (ref 14.0–49.7)
MCH: 28.1 pg (ref 25.1–34.0)
MCHC: 32.9 g/dL (ref 31.5–36.0)
MCV: 85.4 fL (ref 79.5–101.0)
MONO#: 0.3 10*3/uL (ref 0.1–0.9)
MONO%: 4.7 % (ref 0.0–14.0)
NEUT#: 6.6 10*3/uL — ABNORMAL HIGH (ref 1.5–6.5)
NEUT%: 90.9 % — ABNORMAL HIGH (ref 38.4–76.8)
PLATELETS: 88 10*3/uL — AB (ref 145–400)
RBC: 4.05 10*6/uL (ref 3.70–5.45)
RDW: 14.2 % (ref 11.2–14.5)
WBC: 7.2 10*3/uL (ref 3.9–10.3)
lymph#: 0.2 10*3/uL — ABNORMAL LOW (ref 0.9–3.3)
nRBC: 0 % (ref 0–0)

## 2013-03-13 MED ORDER — HYDROMORPHONE HCL 4 MG PO TABS: ORAL_TABLET | ORAL | Status: DC

## 2013-03-13 NOTE — Progress Notes (Signed)
Patient continues to struggle with pain.  Her nausea has improved.  She continues to work to increase oral intake.  Patient tolerated Juice-based oral nutrition supplements.  Last weight documented as 183.4 pounds on February 27, down from 185 pounds February 20.  Nutrition diagnosis: Food and nutrition related knowledge deficit improved.  Intervention: Patient educated to continue small, frequent meals to promote weight stabilization.  Patient encouraged to continue oral nutrition supplements as tolerated.  Teach back method used.  Monitoring, evaluation, goals: Patient will tolerate adequate calories and protein for weight maintenance.  Next visit: Will follow patient as needed.  Patient does not have chemotherapy scheduled at this time.

## 2013-03-13 NOTE — Patient Instructions (Signed)
Continue the 75 mcg Duragesic patch as prescribed. Continue taking your Dilaudid 2-4 mg by mouth every 4-6 hours as needed for breakthrough pain Return on 03/13/2013 for labs and your next cycle of chemotherapy as well as your radiation therapy as scheduled. Followup with Dr. Marko Plume as previously scheduled.

## 2013-03-13 NOTE — Progress Notes (Signed)
Plts 88.  Per on call MD Dr Julien Nordmann (Dr Marko Plume is not in clinic today), tx needs to be held today.  Will resume tx as scheduled next week.  Pt is requesting refill on pain medication.  She is taking dilaudid 4mg  every 4 hr and has her fentanyl patch on 58mcg.  Per AJ, will give refill for rx and possibly may need to increase her fentanyl to 142mcg since she is still having to take her dilaudid so frequently.  SLJ

## 2013-03-14 ENCOUNTER — Ambulatory Visit
Admission: RE | Admit: 2013-03-14 | Discharge: 2013-03-14 | Disposition: A | Discharge status: Home / Self Care | Payer: BC Managed Care – PPO | Source: Ambulatory Visit | Attending: Radiation Oncology | Admitting: Radiation Oncology

## 2013-03-14 VITALS — BP 114/72 | HR 97 | Temp 97.7°F | Ht 67.0 in | Wt 174.0 lb

## 2013-03-14 DIAGNOSIS — C539 Malignant neoplasm of cervix uteri, unspecified: Secondary | ICD-10-CM

## 2013-03-14 NOTE — Progress Notes (Signed)
[place]     Rexene Edison, M.D. [place], [place] [address]               Blair Promise, M.D., Ph.D. Phone: [phone]      [place] A. Tammi Klippel, M.D. Fax: [phone]      Jodelle Gross, M.D., Ph.D.         Thea Silversmith, M.D.         Wyvonnia Lora, M.D Weekly Treatment Management Note  Name: Gina Barnett     MRN: [id number]        CSN: [id number] Date: 03/14/2013      DOB: 1955/12/14  CC:  Melinda Crutch, MD         Harrington Challenger    Status: Outpatient  Diagnosis: The encounter diagnosis was Cervix cancer.  Current Dose: 37.8 Gy  Current Fraction: 21  Planned Dose: 45 Gy  Narrative: Gina Barnett was seen today for weekly treatment management. The chart was checked and MVCT  were reviewed. She is quite fatigued at this time. She continues to have a lot of pain. She has been bumped up to the Duragesic patch 100 mcg per hour. She also takes Dilaudid approximately every 4 hours. She denies any significant vaginal bleeding at this time. Her chemotherapy has been held late last week in light of poor hydration and acute pain  Latex and Penicillins  Current Outpatient Prescriptions  Medication Sig Dispense Refill  . albuterol (PROVENTIL HFA;VENTOLIN HFA) 108 (90 BASE) MCG/ACT inhaler Inhale into the lungs every 6 (six) hours as needed for wheezing or shortness of breath.      . bisacodyl (DULCOLAX) 5 MG EC tablet Take 5 mg by mouth daily as needed for moderate constipation.      . Budesonide (PULMICORT IN) Inhale 2 puffs into the lungs every evening.       . Cholecalciferol (VITAMIN D3) 5000 UNITS CAPS Take 1 capsule by mouth daily. Take Monday thru Friday      . ciprofloxacin (CIPRO) 250 MG tablet Take 250 mg by mouth 2 (two) times daily.      . clobetasol cream (TEMOVATE) 6.71 % Apply 1 application topically 2 (two) times daily as needed.       . fentaNYL (DURAGESIC - DOSED MCG/HR) 25 MCG/HR patch Place 25 mcg onto the skin  every 3 (three) days.      . fentaNYL (DURAGESIC - DOSED MCG/HR) 75 MCG/HR Place 1 patch (75 mcg total) onto the skin every 3 (three) days. Start tonight  5 patch  0  . fexofenadine (ALLEGRA) 180 MG tablet Take 180 mg by mouth every morning.      . folic acid (FOLVITE) 1 MG tablet Take 1 mg by mouth daily.      Marland Kitchen LORazepam (ATIVAN) 1 MG tablet Take 1 tablet under tongue or swallow every 6 hours as needed for nausea. Will make drowsy.  30 tablet  0  . Meth-Hyo-M Bl-Na Phos-Ph Sal (URELLE PO) Take 1 tablet by mouth 4 (four) times daily as needed (prescribed by Dr Haskell Flirt 03/11/13.).      Marland Kitchen ondansetron (ZOFRAN) 8 MG tablet Take 1-2 tablets (8-16 mg total) by mouth every 12 (twelve) hours as needed for nausea or vomiting.  30 tablet  1  . polyethylene glycol (MIRALAX / GLYCOLAX) packet Take 17 g by mouth daily.      . tamsulosin (FLOMAX) 0.4 MG CAPS capsule Take  0.4 mg by mouth.      . triamcinolone cream (KENALOG) 0.1 % Apply 1 application topically as needed.      . vitamin B-12 (CYANOCOBALAMIN) 1000 MCG tablet Take 1,000 mcg by mouth daily.      . ferrous fumarate (HEMOCYTE) 325 (106 FE) MG TABS tablet Take 1 tablet twice a day on an empty stomach with OJ  60 each  2  . HYDROmorphone (DILAUDID) 4 MG tablet Take one (1) tablet by mouth every 4 to 6 hours as needed for breakthrough pain  60 tablet  0   No current facility-administered medications for this encounter.   Labs:  Lab Results  Component Value Date   WBC 7.2 03/13/2013   HGB 11.4* 03/13/2013   HCT 34.6* 03/13/2013   MCV 85.4 03/13/2013   PLT 88* 03/13/2013   Lab Results  Component Value Date   CREATININE 1.2* 03/10/2013   BUN 12.5 03/10/2013   NA 128* 03/10/2013   K 4.8 03/10/2013   CO2 26 03/10/2013   Lab Results  Component Value Date   ALT 8 03/10/2013   AST 21 03/10/2013   BILITOT 0.20 03/10/2013    Physical Examination:  Filed Vitals:   03/14/13 1019  BP: 114/72  Pulse: 97  Temp: 97.7 F (36.5 C)    Wt Readings from  Last 3 Encounters:  03/14/13 174 lb (78.926 kg)  03/10/13 183 lb 6.4 oz (83.19 kg)  03/08/13 188 lb 8 oz (85.503 kg)     Lungs - Normal respiratory effort, chest expands symmetrically. Lungs are clear to auscultation, no crackles or wheezes.  Heart has regular rhythm and rate  Abdomen is soft and non tender with normal bowel sounds  Assessment:  Patient tolerating treatments well except for above issues  Plan: Continue treatment per original radiation prescription

## 2013-03-14 NOTE — Progress Notes (Signed)
Ms. Ballentine has received 21 fractions to her pelvis.  She reports one episode of nausea and vomiting with large results.  She c/o pain in the low pelvic region and hips as a level 6 on a scale of 0-10.

## 2013-03-15 ENCOUNTER — Ambulatory Visit
Admission: RE | Admit: 2013-03-15 | Discharge: 2013-03-15 | Disposition: A | Discharge status: Home / Self Care | Payer: BC Managed Care – PPO | Source: Ambulatory Visit | Attending: Radiation Oncology | Admitting: Radiation Oncology

## 2013-03-16 ENCOUNTER — Ambulatory Visit
Admission: RE | Admit: 2013-03-16 | Discharge: 2013-03-16 | Disposition: A | Discharge status: Home / Self Care | Payer: BC Managed Care – PPO | Source: Ambulatory Visit | Attending: Radiation Oncology | Admitting: Radiation Oncology

## 2013-03-17 ENCOUNTER — Other Ambulatory Visit: Payer: Self-pay | Admitting: Oncology

## 2013-03-17 ENCOUNTER — Ambulatory Visit
Admission: RE | Admit: 2013-03-17 | Discharge: 2013-03-17 | Disposition: A | Discharge status: Home / Self Care | Payer: BC Managed Care – PPO | Source: Ambulatory Visit | Attending: Radiation Oncology | Admitting: Radiation Oncology

## 2013-03-17 ENCOUNTER — Telehealth: Payer: Self-pay

## 2013-03-17 VITALS — BP 119/67 | HR 101 | Temp 98.6°F | Ht 67.0 in | Wt 174.7 lb

## 2013-03-17 DIAGNOSIS — C539 Malignant neoplasm of cervix uteri, unspecified: Secondary | ICD-10-CM

## 2013-03-17 MED ORDER — LORAZEPAM 1 MG PO TABS: ORAL_TABLET | ORAL | Status: DC

## 2013-03-17 MED ORDER — FENTANYL 100 MCG/HR TD PT72: 100.0000 ug | MEDICATED_PATCH | TRANSDERMAL | Status: DC

## 2013-03-17 MED ORDER — HYDROMORPHONE HCL 4 MG PO TABS: ORAL_TABLET | ORAL | Status: DC

## 2013-03-17 NOTE — Progress Notes (Signed)
Department of Radiation Oncology  Phone:  [phone] Fax:        [phone]  Weekly Treatment Note    Name: Gina Barnett Date: 03/17/2013 MRN: [id number] DOB: 05/31/1955   Current dose: 45 Gy  Current fraction: 25   MEDICATIONS: Current Outpatient Prescriptions  Medication Sig Dispense Refill  . albuterol (PROVENTIL HFA;VENTOLIN HFA) 108 (90 BASE) MCG/ACT inhaler Inhale into the lungs every 6 (six) hours as needed for wheezing or shortness of breath.      . bisacodyl (DULCOLAX) 5 MG EC tablet Take 5 mg by mouth daily as needed for moderate constipation.      . Cholecalciferol (VITAMIN D3) 5000 UNITS CAPS Take 1 capsule by mouth daily. Take Monday thru Friday      . clobetasol cream (TEMOVATE) 6.57 % Apply 1 application topically 2 (two) times daily as needed.       . fentaNYL (DURAGESIC - DOSED MCG/HR) 100 MCG/HR Place 1 patch (100 mcg total) onto the skin every 3 (three) days.  5 patch  0  . ferrous fumarate (HEMOCYTE) 325 (106 FE) MG TABS tablet Take 1 tablet twice a day on an empty stomach with OJ  60 each  2  . folic acid (FOLVITE) 1 MG tablet Take 1 mg by mouth daily.      Marland Kitchen HYDROmorphone (DILAUDID) 4 MG tablet Take one 1-2  tablet by mouth every 4 to 6 hours as needed for breakthrough pain  60 tablet  0  . LORazepam (ATIVAN) 1 MG tablet Take 1 tablet under tongue or swallow every 6 hours as needed for nausea. Will make drowsy.  30 tablet  0  . LORazepam (ATIVAN) 1 MG tablet Place 1 tablet under tongue or swallow every 6 hours as needed for nausea.  Will make drowsy.  30 tablet  0  . Meth-Hyo-M Bl-Na Phos-Ph Sal (URELLE PO) Take 1 tablet by mouth 4 (four) times daily as needed (prescribed by Dr Haskell Flirt 03/11/13.).      Marland Kitchen ondansetron (ZOFRAN) 8 MG tablet Take 1-2 tablets (8-16 mg total) by mouth every 12 (twelve) hours as needed for nausea or vomiting.  30 tablet  1  . polyethylene glycol (MIRALAX / GLYCOLAX) packet Take 17 g by mouth daily.      . tamsulosin  (FLOMAX) 0.4 MG CAPS capsule Take 0.4 mg by mouth.      . triamcinolone cream (KENALOG) 0.1 % Apply 1 application topically as needed.      . vitamin B-12 (CYANOCOBALAMIN) 1000 MCG tablet Take 1,000 mcg by mouth daily.      . fexofenadine (ALLEGRA) 180 MG tablet Take 180 mg by mouth every morning.       No current facility-administered medications for this encounter.     ALLERGIES: Latex and Penicillins   LABORATORY DATA:  Lab Results  Component Value Date   WBC 7.2 03/13/2013   HGB 11.4* 03/13/2013   HCT 34.6* 03/13/2013   MCV 85.4 03/13/2013   PLT 88* 03/13/2013   Lab Results  Component Value Date   NA 128* 03/10/2013   K 4.8 03/10/2013   CO2 26 03/10/2013   Lab Results  Component Value Date   ALT 8 03/10/2013   AST 21 03/10/2013   ALKPHOS 80 03/10/2013   BILITOT 0.20 03/10/2013     NARRATIVE: Gina Barnett was seen today for weekly treatment management. The chart was checked and the patient's films were reviewed. The patient states that she does not feel  very well today. Over the last week she has done okay. She has some pain in her lower back and has a medication for this, both long-acting and short-acting pain medication. She denies any diarrhea. She denies any change in a very small amount of blood in her urine which began after the urinary stents. She does complain of some nausea. She takes lorazepam for this and feels that this is working adequately.  PHYSICAL EXAMINATION: height is 5\' 7"  (1.702 m) and weight is 174 lb 11.2 oz (79.243 kg). Her temperature is 98.6 F (37 C). Her blood pressure is 119/67 and her pulse is 101.        ASSESSMENT: The patient  did satisfactorily with treatment.  PLAN: Followup in one month.

## 2013-03-17 NOTE — Telephone Encounter (Signed)
Amiee friend of Ms. Lasser called stating that Ms. Justo is on the Duragesic patch 100 mcg since 03-13-13.  She is continuing to take 4 mg of Dilaudid q 4 hours.  Her lowest pain level is a 4/10.  She is crying and moaning from the pain at times. Told Amiee and Ms. Novick that Awilda Metro PA-C said to increase the Dilaudid to 4 mg 1-2 tabs q 4 hrs prn and stay on the 100 mcg dose of Duragesic. Amiee and Ms. Kewley verbalized understanding. Refills needed for Duragesic, ativan , and dilaudid. Patient to pick up prescriptions when she comes for radiation today.  Prescriptions are with the injection nurse.

## 2013-03-17 NOTE — Progress Notes (Signed)
Gina Barnett has had 25 fractions to her pelvis.  She continues to have pain in her lower back, pelvic area and upper legs.  She is rating the pain at a 6/10.  She has a 100 mcg Fentanyl patch on and takes 1-2 tablets of Dilaudid 4 mg q 4 hours.  She took 2 dilaudid this morning and feels sleep today.  She denies diarrhea.  She has urinary stents and reports a streak of blood in her urine.  She said she has had this since the stents were done.  She said her urologist is aware.  She denies vaginal bleeding.  She does have nausea and is taking lorazepam as needed.  She has lost 9 lb since 03/10/13.  She is meeting with the dietician.  She reports drinking a lot of fluids.   She does have an follow up appointment set up for 1 month with Dr. Sondra Come.

## 2013-03-20 ENCOUNTER — Encounter (HOSPITAL_BASED_OUTPATIENT_CLINIC_OR_DEPARTMENT_OTHER): Payer: Self-pay | Admitting: Urology

## 2013-03-20 ENCOUNTER — Encounter: Payer: Self-pay | Admitting: Radiation Oncology

## 2013-03-20 NOTE — Progress Notes (Signed)
  Radiation Oncology         [phone] ________________________________  Name: Gina Barnett MRN: [id number]  Date: 03/20/2013  DOB: 06-20-1955  End of Treatment Note  Diagnosis:    Cervix cancer   Primary site: Cervix Uteri   Staging method: AJCC 7th Edition   Clinical: Stage IIIB (T3b, N1, M0)   Summary: Stage IIIB (T3b, N1, M0)   Indication for treatment:  Definitive treatment along with radiosensitizing chemotherapy       Radiation treatment dates:   February 2 through March 6  Site/dose:   Pelvis and periaortic area 45 gray in 25 fractions  Beams/energy:   Helical intensity modulated radiation therapy, 6 MV photons  Narrative: The patient tolerated radiation treatment relatively well,  she however continued to have significant pain in the pelvis region throughout her treatment. She was also quite uncomfortable with her indwelling urinary stents.  Her vaginal bleeding/drainage improved during the course of treatment. She did have significant fatigue during the course of her treatment.  Plan: The patient has completed radiation treatment. The patient will return to radiation oncology clinic for routine followup in one month. I advised them to call or return sooner if they have any questions or concerns related to their recovery or treatment. The patient at a later date may be considered for brachytherapy however given the extent and bulk of her disease, this may not be technically feasible.  -----------------------------------  Blair Promise, PhD, MD

## 2013-03-21 ENCOUNTER — Other Ambulatory Visit: Payer: Self-pay | Admitting: Oncology

## 2013-03-21 DIAGNOSIS — D500 Iron deficiency anemia secondary to blood loss (chronic): Secondary | ICD-10-CM

## 2013-03-21 DIAGNOSIS — C539 Malignant neoplasm of cervix uteri, unspecified: Secondary | ICD-10-CM

## 2013-03-22 ENCOUNTER — Encounter: Payer: Self-pay | Admitting: Oncology

## 2013-03-22 ENCOUNTER — Ambulatory Visit (HOSPITAL_BASED_OUTPATIENT_CLINIC_OR_DEPARTMENT_OTHER): Payer: BC Managed Care – PPO | Admitting: Oncology

## 2013-03-22 ENCOUNTER — Other Ambulatory Visit (HOSPITAL_BASED_OUTPATIENT_CLINIC_OR_DEPARTMENT_OTHER): Payer: BC Managed Care – PPO

## 2013-03-22 ENCOUNTER — Telehealth: Payer: Self-pay | Admitting: *Deleted

## 2013-03-22 VITALS — BP 115/75 | HR 109 | Temp 98.5°F | Resp 18 | Ht 67.0 in | Wt 171.1 lb

## 2013-03-22 DIAGNOSIS — C539 Malignant neoplasm of cervix uteri, unspecified: Secondary | ICD-10-CM

## 2013-03-22 DIAGNOSIS — D500 Iron deficiency anemia secondary to blood loss (chronic): Secondary | ICD-10-CM

## 2013-03-22 DIAGNOSIS — N135 Crossing vessel and stricture of ureter without hydronephrosis: Secondary | ICD-10-CM

## 2013-03-22 DIAGNOSIS — E43 Unspecified severe protein-calorie malnutrition: Secondary | ICD-10-CM

## 2013-03-22 DIAGNOSIS — N920 Excessive and frequent menstruation with regular cycle: Secondary | ICD-10-CM

## 2013-03-22 DIAGNOSIS — E46 Unspecified protein-calorie malnutrition: Secondary | ICD-10-CM

## 2013-03-22 LAB — CBC WITH DIFFERENTIAL/PLATELET
BASO%: 0.1 % (ref 0.0–2.0)
Basophils Absolute: 0 10*3/uL (ref 0.0–0.1)
EOS%: 2.4 % (ref 0.0–7.0)
Eosinophils Absolute: 0.1 10*3/uL (ref 0.0–0.5)
HCT: 32.7 % — ABNORMAL LOW (ref 34.8–46.6)
HGB: 10.6 g/dL — ABNORMAL LOW (ref 11.6–15.9)
LYMPH#: 0 10*3/uL — AB (ref 0.9–3.3)
LYMPH%: 1.3 % — ABNORMAL LOW (ref 14.0–49.7)
MCH: 28.2 pg (ref 25.1–34.0)
MCHC: 32.3 g/dL (ref 31.5–36.0)
MCV: 87.3 fL (ref 79.5–101.0)
MONO#: 0.3 10*3/uL (ref 0.1–0.9)
MONO%: 10.1 % (ref 0.0–14.0)
NEUT#: 2.7 10*3/uL (ref 1.5–6.5)
NEUT%: 86.1 % — AB (ref 38.4–76.8)
Platelets: 123 10*3/uL — ABNORMAL LOW (ref 145–400)
RBC: 3.75 10*6/uL (ref 3.70–5.45)
RDW: 15.3 % — ABNORMAL HIGH (ref 11.2–14.5)
WBC: 3.1 10*3/uL — ABNORMAL LOW (ref 3.9–10.3)

## 2013-03-22 LAB — COMPREHENSIVE METABOLIC PANEL (CC13)
ALT: 9 U/L (ref 0–55)
AST: 11 U/L (ref 5–34)
Albumin: 2.5 g/dL — ABNORMAL LOW (ref 3.5–5.0)
Alkaline Phosphatase: 76 U/L (ref 40–150)
Anion Gap: 11 mEq/L (ref 3–11)
BUN: 8.8 mg/dL (ref 7.0–26.0)
CALCIUM: 10.1 mg/dL (ref 8.4–10.4)
CHLORIDE: 93 meq/L — AB (ref 98–109)
CO2: 29 mEq/L (ref 22–29)
CREATININE: 1.1 mg/dL (ref 0.6–1.1)
Glucose: 125 mg/dl (ref 70–140)
Potassium: 4.3 mEq/L (ref 3.5–5.1)
Sodium: 133 mEq/L — ABNORMAL LOW (ref 136–145)
Total Bilirubin: 0.24 mg/dL (ref 0.20–1.20)
Total Protein: 7.1 g/dL (ref 6.4–8.3)

## 2013-03-22 LAB — MAGNESIUM (CC13): MAGNESIUM: 1.8 mg/dL (ref 1.5–2.5)

## 2013-03-22 MED ORDER — HYDROMORPHONE HCL 4 MG PO TABS: ORAL_TABLET | ORAL | Status: DC

## 2013-03-22 NOTE — Telephone Encounter (Signed)
appts made and printed...td 

## 2013-03-22 NOTE — Progress Notes (Signed)
OFFICE PROGRESS NOTE   03/22/2013   Physicians: D.ClarkePearson,J.Kinard, C.Melinda Crutch (PCP), Roney Mans, Louis Meckel ([facility])   INTERVAL HISTORY:  Patient is seen, together with friend Amy, in continuing attention to squamous cell carcinoma of cervix, treated to this point with external beam RT given with 2 cycles of sensitizing CDDP chemotherapy. The external beam radiation given was 45 gray to pelvis and periaortics from 02-13-13 thru 03-17-13; she has return visit to Dr Sondra Come on 04-27-13. Brachytherapy may be considered later depending on response. She is to see Dr Louis Meckel next week. She is not presently scheduled back to gyn oncology.   Vaginal bleeding resolved with the radiation, tho she continues to have significant pain in low pelvis bilaterally, which may be from stents or disease or combination. She is presently on duragesic 100 mcg q 72 hrs and dilaudid 4-8 mg prn. She used total of 4 dilaudid tablets (8 mg twice) yesterday and has used 4 tablets (8 mg twice) by time of afternoon visit today; she also had ativan ~ 3 hrs prior to today's visit. She has used up to 8-10 dilaudid per day previously. She is eating very little, including nothing by time of visit today, is drinking gatorade, but  not drinking supplements despite instructions by [facility] dietician.   Housemate is now in SNF. Patient is staying alone during week and spending weekends with brother and sister in law (sister in law also a nurse). Patient is concerned about medical bills and has met with [facility] financial staff after visit today. She wants to return to work, however I have told her that is not appropriate due to pain medication and inadequate po intake; she has total 12 weeks leave, thru ~ second week in April. [facility] chaplain also involved.  She does not have PAC.   ONCOLOGIC HISTORY Patient presented with heavy vaginal bleeding, with low back pain and right hip pain. She was seen by Dr Dellis Filbert, with biopsies from  upper vagina and lower anterior vagina showing invasive squamous cell carcinoma. 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 She saw Dr Josephina Shih 01-27-13, with cervix replaced by fungating tumor extending to both pelvic sidewalls and nodular lesion halfway down anterior vaginal wall. Right hip xray 01-27-13 showed no bony mets. PET 02-10-13 at [facility] had new right hydronephrosis in addition to the previous left hydronephrosis, uptake in para-aortic nodes, left iliac nodes, bulky cervical mass, adenopathy adjacent to rectum; lungs and bones ok by PET. Bilateral JJ stents were placed 02-17-12. First CDDP was given 02-20-13. Chemo held 2-16 with acute pain and inadequate hydration; transfused 1 unit PRBCs 2-16 for Hgb 9.6. She had second CDDP on 03-10-13, then chemo held following week due to low counts. External beam RT was given 02-13-13 thru 03-17-13, 45 gray to pelvis and periaortics.     Review of systems as above, also: Temder area right forearm x1-2 days possibly at site of previous IV. No fever. Bowels not moving daily and not using laxatives daily despite narcotics - discussed. No SOB. Occasional spotting with urination. Is sleeping thru night.  Remainder of 10 point Review of Systems negative.  Objective:  Vital signs in last 24 hours: Weight is down 3 more lbs, to 171, this down at least 15 lbs from start  of treatment. BP 115/75, HR 109 regular, resp 18 not labored RA, temp 98.5  A little drowsy but cooperative, seems to follow discussion, oriented, appropriate conversation. Ambulatory without assistance, much more easily than previously.  No alopecia  HEENT:PERRL, sclerae not icteric. Oral mucosa somewhat dry  without lesions, posterior pharynx clear.  Neck supple. No JVD.  Lymphatics:no cervical,suraclavicular or inguinal adenopathy Resp: clear to auscultation bilaterally and normal percussion bilaterally Cardio: regular rate and rhythm. No gallop. GI: soft, nontender, not distended, no mass or organomegaly. Minimal bowel sounds.  Musculoskeletal/ Extremities: without pitting edema, cords, tenderness other than 2 cm area swelling dorsal lower right forearm, minimal erythema, no heat, no streaking. Neuro: no peripheral neuropathy. Speech fluent. Otherwise nonfocal. Psych as above Skin without rash, ecchymosis, petechiae   Lab Results:  Results for orders placed in visit on 03/22/13  CBC WITH DIFFERENTIAL      Result Value Ref Range   WBC 3.1 (*) 3.9 - 10.3 10e3/uL   NEUT# 2.7  1.5 - 6.5 10e3/uL   HGB 10.6 (*) 11.6 - 15.9 g/dL   HCT 32.7 (*) 34.8 - 46.6 %   Platelets 123 (*) 145 - 400 10e3/uL   MCV 87.3  79.5 - 101.0 fL   MCH 28.2  25.1 - 34.0 pg   MCHC 32.3  31.5 - 36.0 g/dL   RBC 3.75  3.70 - 5.45 10e6/uL   RDW 15.3 (*) 11.2 - 14.5 %   lymph# 0.0 (*) 0.9 - 3.3 10e3/uL   MONO# 0.3  0.1 - 0.9 10e3/uL   Eosinophils Absolute 0.1  0.0 - 0.5 10e3/uL   Basophils Absolute 0.0  0.0 - 0.1 10e3/uL   NEUT% 86.1 (*) 38.4 - 76.8 %   LYMPH% 1.3 (*) 14.0 - 49.7 %   MONO% 10.1  0.0 - 14.0 %   EOS% 2.4  0.0 - 7.0 %   BASO% 0.1  0.0 - 2.0 %   CMET resulted and discussed with patient has Na up to 133, K 4.3, chloride up to 93, creat 1.1, LFTs normal, albumin 2.5 Magnesium 1.8 Iron studies available after visit with serum iron 43, %sat 21, ferritin 270  Studies/Results:  No results found.  Medications: I have reviewed the patient's current medications. See instructions for laxatives and pain medication below. Continue po ferrous fumarate. Discussed watching pain medication closely.  DISCUSSION I have told patient that, as long as she is stable, we will wail at least 4 weeks from completion of  radiation prior to repeating scans, to allow maximum benefit from radiation. She may need additional therapy then, with chemotherapy or other radiation. I do not expect the stents can be removed yet, and they may be needed long term (vs PCNs), so will likely be changed after she sees Dr Louis Meckel.   Written and oral instructions given to patient and friend as follows:  Warm soaks every 15-30 min to right arm area of irritated vein all the rest of this afternoon and evening. Also eat or drink Boost and take 1 advil when you get home for that irritation. You can repeat the advil with food or Boost in AM 3-12 if arm still bothersome. Let Dr Marko Plume know if area gets more red or more tender.  Take miralax this PM and daily. Add dulcolax if you go longer than 24 hours without bowel movements. The abdominal pain and vomiting may improve if your bowels empty better.  Drink 4 Boost daily until you are eating 3 good meals daily.  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 External beam RT as above, with only 2 cycles of CDDP possible during that treatment. I will see her again in ~ 2 weeks and she will keep appointments with Drs Sondra Come and Louis Meckel. She will need repeat scans when appropriate from RT standpoint. 2.JJ stents for bilateral ureteral obstruction. Appreciate assistance from Dr Louis Meckel. Stents likely need to be changed rather than removed now. 3.iron deficiency anemia related to vaginal bleeding. On oral iron. Transfused 1 unit PRBCs 02-27-13.  4.never colonoscopy/ ? 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 She may well not be able to return to work.  7.hyponatremia and hypochloremia improving. Discussed 8. Severe protein calorie malnutrition: discussed at length. Patient feels that she can improve po intake. Follow.       Gina Barnett P, MD   03/22/2013, 3:41 PM

## 2013-03-22 NOTE — Progress Notes (Signed)
Advised patient to call billing about setting up pmts for her bills.

## 2013-03-22 NOTE — Patient Instructions (Signed)
Warm soaks every 15-30 min to right arm area of irritated vein all the rest of this afternoon and evening. Also eat or drink Boost and take 1 advil when you get home for that irritation. You can repeat the advil with food or Boost in AM 3-12 if arm still bothersome. Let Dr Marko Plume know if area gets more red or more tender.  Take miralax this PM and daily. Add dulcolax if you go longer than 24 hours without bowel movements. The abdominal pain and vomiting may improve if your bowels empty better.  Drink 4 Boost daily until you are eating 3 good meals daily.    Continue the Duragesic 100 patches every 3 days until you have finished the patches that you have, then decrease to the 75 patch that you have at home.

## 2013-03-23 LAB — IRON AND TIBC CHCC
%SAT: 21 % (ref 21–57)
Iron: 43 ug/dL (ref 41–142)
TIBC: 206 ug/dL — ABNORMAL LOW (ref 236–444)
UIBC: 163 ug/dL (ref 120–384)

## 2013-03-23 LAB — FERRITIN CHCC: Ferritin: 270 ng/ml — ABNORMAL HIGH (ref 9–269)

## 2013-03-24 ENCOUNTER — Encounter: Payer: Self-pay | Admitting: Oncology

## 2013-03-24 DIAGNOSIS — E46 Unspecified protein-calorie malnutrition: Secondary | ICD-10-CM | POA: Insufficient documentation

## 2013-03-31 ENCOUNTER — Telehealth: Payer: Self-pay

## 2013-03-31 ENCOUNTER — Other Ambulatory Visit: Payer: Self-pay | Admitting: Urology

## 2013-03-31 NOTE — Telephone Encounter (Addendum)
Gina Barnett states that she is more nauseous now then when she had the chemo and radiation. No projectile vomiting.  She did move her bowels using  Miralax twice a day, however she began with diarrhea and continued the Miralax for another day.  She stopped it yesterday and has not had a BM today.  She was having a lot of abdominal cramping.  She will resume the Miralax 1/2 dose at a time beginning tomorrow. Suggested that she take 1-2  Zofran 90 minutes prior eating and see if that helps with nausea.  Reviewed foods that might be easier on her digestive system.  Small frequent meals.  Encouraged her to get more boost liquid as she tolderated that well and Dr. Marko Plume suggested to take 4 times a day until she was eating 3 good meals a day. Pt. Verbalized understanding.

## 2013-04-03 ENCOUNTER — Other Ambulatory Visit: Payer: Self-pay | Admitting: Oncology

## 2013-04-03 DIAGNOSIS — C539 Malignant neoplasm of cervix uteri, unspecified: Secondary | ICD-10-CM

## 2013-04-05 ENCOUNTER — Encounter: Payer: Self-pay | Admitting: Oncology

## 2013-04-05 ENCOUNTER — Telehealth: Payer: Self-pay | Admitting: Oncology

## 2013-04-05 ENCOUNTER — Ambulatory Visit (HOSPITAL_BASED_OUTPATIENT_CLINIC_OR_DEPARTMENT_OTHER): Payer: BC Managed Care – PPO | Admitting: Oncology

## 2013-04-05 ENCOUNTER — Other Ambulatory Visit (HOSPITAL_BASED_OUTPATIENT_CLINIC_OR_DEPARTMENT_OTHER): Payer: BC Managed Care – PPO

## 2013-04-05 VITALS — BP 118/76 | HR 93 | Temp 98.3°F | Resp 20 | Ht 67.0 in | Wt 167.5 lb

## 2013-04-05 DIAGNOSIS — C7982 Secondary malignant neoplasm of genital organs: Secondary | ICD-10-CM

## 2013-04-05 DIAGNOSIS — C539 Malignant neoplasm of cervix uteri, unspecified: Secondary | ICD-10-CM

## 2013-04-05 DIAGNOSIS — D500 Iron deficiency anemia secondary to blood loss (chronic): Secondary | ICD-10-CM

## 2013-04-05 DIAGNOSIS — C772 Secondary and unspecified malignant neoplasm of intra-abdominal lymph nodes: Secondary | ICD-10-CM

## 2013-04-05 DIAGNOSIS — N135 Crossing vessel and stricture of ureter without hydronephrosis: Secondary | ICD-10-CM

## 2013-04-05 LAB — BASIC METABOLIC PANEL (CC13)
Anion Gap: 11 mEq/L (ref 3–11)
BUN: 6.2 mg/dL — ABNORMAL LOW (ref 7.0–26.0)
CO2: 28 meq/L (ref 22–29)
Calcium: 10 mg/dL (ref 8.4–10.4)
Chloride: 100 mEq/L (ref 98–109)
Creatinine: 1 mg/dL (ref 0.6–1.1)
Glucose: 102 mg/dl (ref 70–140)
POTASSIUM: 4.6 meq/L (ref 3.5–5.1)
SODIUM: 139 meq/L (ref 136–145)

## 2013-04-05 LAB — CBC WITH DIFFERENTIAL/PLATELET
BASO%: 0.4 % (ref 0.0–2.0)
Basophils Absolute: 0 10*3/uL (ref 0.0–0.1)
EOS%: 2.5 % (ref 0.0–7.0)
Eosinophils Absolute: 0.1 10*3/uL (ref 0.0–0.5)
HEMATOCRIT: 35.8 % (ref 34.8–46.6)
HGB: 11.6 g/dL (ref 11.6–15.9)
LYMPH#: 0.3 10*3/uL — AB (ref 0.9–3.3)
LYMPH%: 6.2 % — ABNORMAL LOW (ref 14.0–49.7)
MCH: 28.3 pg (ref 25.1–34.0)
MCHC: 32.4 g/dL (ref 31.5–36.0)
MCV: 87.5 fL (ref 79.5–101.0)
MONO#: 0.4 10*3/uL (ref 0.1–0.9)
MONO%: 8.8 % (ref 0.0–14.0)
NEUT#: 3.7 10*3/uL (ref 1.5–6.5)
NEUT%: 82.1 % — ABNORMAL HIGH (ref 38.4–76.8)
Platelets: 317 10*3/uL (ref 145–400)
RBC: 4.09 10*6/uL (ref 3.70–5.45)
RDW: 16.8 % — ABNORMAL HIGH (ref 11.2–14.5)
WBC: 4.5 10*3/uL (ref 3.9–10.3)

## 2013-04-05 MED ORDER — FENTANYL 75 MCG/HR TD PT72: 75.0000 ug | MEDICATED_PATCH | TRANSDERMAL | Status: DC

## 2013-04-05 MED ORDER — LORAZEPAM 1 MG PO TABS: ORAL_TABLET | ORAL | Status: DC

## 2013-04-05 NOTE — Patient Instructions (Signed)
Call if you do not need any extra pain medication after you have been on the 75 patches, as we could decrease dose of patch even before next visit if so.  You need to really focus on good nutrition, with more protein and still lots of fluids.

## 2013-04-05 NOTE — Progress Notes (Signed)
OFFICE PROGRESS NOTE   04/05/2013   Physicians:D.ClarkePearson,J.Kinard, C.Melinda Crutch (PCP), Roney Mans, Louis Meckel ([facility])   INTERVAL HISTORY:  Patient is seen, together with friend Aimee, in continuing attention to IIIB squamous cell carcinoma of cervix, with radiation completed 03-17-13. She had a very difficult time thru most of the treatment course thus far, such that she was able to receive only 2 cycles of sensitizing CDDP, but is doing better this week. She has had no further vaginal bleeding. Pain is improved, with only ~ 2 prn dilaudid used in last 24 hours and to decrease duragesic from 100 mcg to 75 mcg today. Bowels are still not moving daily and we have discussed increasing miralax. PO intake is not adequate, still has not tried supplements, has had nothing by time of afternoon visit today, no significant nausea. She is a little more active at home, trying to walk outside, is still not trying to drive.She is getting more sleep than initially. Patient is concerned about leave time from work, hopes she will be able to return on limited basis in next couple of weeks, tho she is aware that she needs to do much better with nutrition and activity before that.  She is to see Dr Sondra Come again on 04-27-13. He may consider HDR depending on response.   She has bilateral JJ stents.  She does not have PAC.   ONCOLOGIC HISTORY Patient presented with heavy vaginal bleeding, with low back pain and right hip pain. She was seen by Dr Dellis Filbert, with biopsies from upper vagina and lower anterior vagina showing invasive squamous cell carcinoma. 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 She saw Dr Josephina Shih  01-27-13, with cervix replaced by fungating tumor extending to both pelvic sidewalls and nodular lesion halfway down anterior vaginal wall. Right hip xray 01-27-13 showed no bony mets. PET 02-10-13 at [facility] had new right hydronephrosis in addition to the previous left hydronephrosis, uptake in para-aortic nodes, left iliac nodes, bulky cervical mass, adenopathy adjacent to rectum; lungs and bones ok by PET. Bilateral JJ stents were placed 02-17-12. First CDDP was given 02-20-13. Chemo held 2-16 with acute pain and inadequate hydration; transfused 1 unit PRBCs 2-16 for Hgb 9.6. She had second CDDP on 03-10-13, then chemo held last week of RT due to low counts. External beam RT was given 02-13-13 thru 03-17-13, 45 gray to pelvis and periaortics.    Review of systems as above, also: No fever. No SOB or cough. Seems to have less discomfort from [employer] stents. No LE swelling. No other bleeding. Remainder of 10 point Review of Systems negative.  Objective:  Vital signs in last 24 hours:  BP 118/76  Pulse 93  Temp(Src) 98.3 F (36.8 C) (Oral)  Resp 20  Ht 5\' 7"  (1.702 m)  Wt 167 lb 8 oz (75.978 kg)  BMI 26.23 kg/m2 Weight is down 4 more lbs. Alert, oriented and appropriate, smiling, looks better overall. Much more easily mobile and ambulatory today. No alopecia  HEENT:PERRL, sclerae not icteric. Oral mucosa moist without lesions, posterior pharynx clear.  Neck supple. No JVD.  Lymphatics:no cervical,suraclavicular adenopathy Resp: clear to auscultation bilaterally and normal percussion bilaterally Cardio: regular rate and rhythm. No gallop. GI: soft, nontender, minimally distended, no clear mass or organomegaly.  Few bowel sounds. Musculoskeletal/ Extremities: without pitting edema, cords, tenderness Neuro: no peripheral neuropathy. Otherwise nonfocal. Psych appropriate mood and affect. Skin without rash, ecchymosis, petechiae   Lab Results:  Results for orders placed in visit on 04/05/13  CBC WITH  DIFFERENTIAL      Result Value Ref Range   WBC 4.5  3.9 - 10.3 10e3/uL   NEUT# 3.7  1.5 - 6.5 10e3/uL   HGB 11.6  11.6 - 15.9 g/dL   HCT 35.8  34.8 - 46.6 %   Platelets 317  145 - 400 10e3/uL   MCV 87.5  79.5 - 101.0 fL   MCH 28.3  25.1 - 34.0 pg   MCHC 32.4  31.5 - 36.0 g/dL   RBC 4.09  3.70 - 5.45 10e6/uL   RDW 16.8 (*) 11.2 - 14.5 %   lymph# 0.3 (*) 0.9 - 3.3 10e3/uL   MONO# 0.4  0.1 - 0.9 10e3/uL   Eosinophils Absolute 0.1  0.0 - 0.5 10e3/uL   Basophils Absolute 0.0  0.0 - 0.1 10e3/uL   NEUT% 82.1 (*) 38.4 - 76.8 %   LYMPH% 6.2 (*) 14.0 - 49.7 %   MONO% 8.8  0.0 - 14.0 %   EOS% 2.5  0.0 - 7.0 %   BASO% 0.4  0.0 - 2.0 %  BASIC METABOLIC PANEL (NT61)      Result Value Ref Range   Sodium 139  136 - 145 mEq/L   Potassium 4.6  3.5 - 5.1 mEq/L   Chloride 100  98 - 109 mEq/L   CO2 28  22 - 29 mEq/L   Glucose 102  70 - 140 mg/dl   BUN 6.2 (*) 7.0 - 26.0 mg/dL   Creatinine 1.0  0.6 - 1.1 mg/dL   Calcium 10.0  8.4 - 10.4 mg/dL   Anion Gap 11  3 - 11 mEq/L   Labs reviewed with patient as above.  Studies/Results:  No results found.  Medications: I have reviewed the patient's current medications. Decrease duragesic to 34mcg q 72 hours now; if pain remains well controlled she can let us know prior to next visit for further decrease to 50 mcg if so.   DISCUSSION included pain meds and laxatives as above. She understands that we are waiting to see extent of benefit from initial RT (+CDDP x2) and will decide about additional treatment depending on that. In the meantime, I have encouraged her to focus on improving diet and nutritional status.  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 External beam RT as above, with only 2 cycles of CDDP possible during that treatment. I will follow up in ~ 2 weeks, then she will see Dr Sondra Come on 4-16. She will  need repeat scans when appropriate from RT standpoint.  2.JJ stents for bilateral ureteral obstruction. Appreciate care by Dr Louis Meckel.  3.iron deficiency anemia related to vaginal bleeding. On oral iron. Transfused 1 unit PRBCs 02-27-13.  4.never colonoscopy/ ? 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 [place] support staff involved here.   7.hyponatremia and hypochloremia resolved.  Patient and friend have had all questions answered and are in agreement with plan. Time spent 25 min including >50% counseling and coordination of care.  LIVESAY,LENNIS P, MD   04/05/2013, 2:58 PM

## 2013-04-05 NOTE — Telephone Encounter (Signed)
[facility] PT APPT SCHEDULE FOR APRIL. [facility] WILL CONTACT PT RE APPT - PT AWARE. LEFT MESSAGE FOR KIM RE APPT.

## 2013-04-10 ENCOUNTER — Telehealth: Payer: Self-pay | Admitting: *Deleted

## 2013-04-10 NOTE — Telephone Encounter (Signed)
Kiani left a voice mail stating the fentanyl 75 mcg are not working as well as the 100 mcg. When she was on 169mcg, she would only need to take her dilaudid 1-2 in a 2 week period. Now she is taking 1 dilaudid daily.

## 2013-04-10 NOTE — Telephone Encounter (Signed)
Talked with patient, per Dr Marko Plume " dilaudid daily is still fine, lets stay on duragesic 75 for now". Pt was fine with that decision

## 2013-04-16 ENCOUNTER — Other Ambulatory Visit: Payer: Self-pay | Admitting: Oncology

## 2013-04-16 DIAGNOSIS — C539 Malignant neoplasm of cervix uteri, unspecified: Secondary | ICD-10-CM

## 2013-04-19 ENCOUNTER — Encounter: Payer: Self-pay | Admitting: Oncology

## 2013-04-19 ENCOUNTER — Other Ambulatory Visit (HOSPITAL_BASED_OUTPATIENT_CLINIC_OR_DEPARTMENT_OTHER): Payer: BC Managed Care – PPO

## 2013-04-19 ENCOUNTER — Ambulatory Visit (HOSPITAL_BASED_OUTPATIENT_CLINIC_OR_DEPARTMENT_OTHER): Payer: BC Managed Care – PPO | Admitting: Oncology

## 2013-04-19 ENCOUNTER — Telehealth: Payer: Self-pay | Admitting: Oncology

## 2013-04-19 VITALS — BP 134/85 | HR 105 | Temp 97.7°F | Resp 18 | Ht 67.0 in | Wt 167.5 lb

## 2013-04-19 DIAGNOSIS — C50919 Malignant neoplasm of unspecified site of unspecified female breast: Secondary | ICD-10-CM

## 2013-04-19 DIAGNOSIS — C779 Secondary and unspecified malignant neoplasm of lymph node, unspecified: Secondary | ICD-10-CM

## 2013-04-19 DIAGNOSIS — C539 Malignant neoplasm of cervix uteri, unspecified: Secondary | ICD-10-CM

## 2013-04-19 DIAGNOSIS — N135 Crossing vessel and stricture of ureter without hydronephrosis: Secondary | ICD-10-CM

## 2013-04-19 DIAGNOSIS — C7982 Secondary malignant neoplasm of genital organs: Secondary | ICD-10-CM

## 2013-04-19 DIAGNOSIS — N898 Other specified noninflammatory disorders of vagina: Secondary | ICD-10-CM

## 2013-04-19 DIAGNOSIS — D500 Iron deficiency anemia secondary to blood loss (chronic): Secondary | ICD-10-CM

## 2013-04-19 LAB — CBC WITH DIFFERENTIAL/PLATELET
BASO%: 0.3 % (ref 0.0–2.0)
BASOS ABS: 0 10*3/uL (ref 0.0–0.1)
EOS%: 2.5 % (ref 0.0–7.0)
Eosinophils Absolute: 0.2 10*3/uL (ref 0.0–0.5)
HCT: 36 % (ref 34.8–46.6)
HGB: 11.7 g/dL (ref 11.6–15.9)
LYMPH%: 4.7 % — ABNORMAL LOW (ref 14.0–49.7)
MCH: 28.8 pg (ref 25.1–34.0)
MCHC: 32.5 g/dL (ref 31.5–36.0)
MCV: 88.8 fL (ref 79.5–101.0)
MONO#: 0.5 10*3/uL (ref 0.1–0.9)
MONO%: 8.3 % (ref 0.0–14.0)
NEUT#: 5.3 10*3/uL (ref 1.5–6.5)
NEUT%: 84.2 % — ABNORMAL HIGH (ref 38.4–76.8)
PLATELETS: 278 10*3/uL (ref 145–400)
RBC: 4.05 10*6/uL (ref 3.70–5.45)
RDW: 18.9 % — ABNORMAL HIGH (ref 11.2–14.5)
WBC: 6.4 10*3/uL (ref 3.9–10.3)
lymph#: 0.3 10*3/uL — ABNORMAL LOW (ref 0.9–3.3)

## 2013-04-19 LAB — COMPREHENSIVE METABOLIC PANEL (CC13)
ALBUMIN: 2.9 g/dL — AB (ref 3.5–5.0)
ALK PHOS: 81 U/L (ref 40–150)
ALT: <6 U/L (ref 0–55)
ANION GAP: 8 meq/L (ref 3–11)
AST: 11 U/L (ref 5–34)
BUN: 7.3 mg/dL (ref 7.0–26.0)
CO2: 28 meq/L (ref 22–29)
Calcium: 10.1 mg/dL (ref 8.4–10.4)
Chloride: 101 mEq/L (ref 98–109)
Creatinine: 1.1 mg/dL (ref 0.6–1.1)
GLUCOSE: 152 mg/dL — AB (ref 70–140)
POTASSIUM: 4.6 meq/L (ref 3.5–5.1)
Sodium: 137 mEq/L (ref 136–145)
Total Bilirubin: 0.24 mg/dL (ref 0.20–1.20)
Total Protein: 7 g/dL (ref 6.4–8.3)

## 2013-04-19 MED ORDER — FENTANYL 50 MCG/HR TD PT72: 50.0000 ug | MEDICATED_PATCH | TRANSDERMAL | Status: DC

## 2013-04-19 NOTE — Telephone Encounter (Signed)
no appt w/LL at this time. no appts for chcc on 4/8 pof. pt given AVS which includes appts for kinard and [facility]

## 2013-04-19 NOTE — Progress Notes (Signed)
OFFICE PROGRESS NOTE   04/19/2013   Physicians:D.ClarkePearson,J.Kinard, C.Melinda Crutch (PCP), Roney Mans, Louis Meckel ([facility])   INTERVAL HISTORY:  Patient is seen, together with brother, in continuing attention to squamous cell carcinoma of cervix, treated to this point with 45 gray to pelvis and periaortic area completed 03-17-13, with 2 cycles of sensitizing CDDP. She has had improvement in pain and resolution of vaginal bleeding since this treatment. Dr Sondra Come will consider brachytherapy depending on response, next appointment 04-27-13. She may also need repeat scans after she sees Dr Sondra Come; she is for bilateral ureteral stent change 05-12-13 and next appointment to Dr Josephina Shih 05-26-13.   Patient has been feeling better other than ran out of duragesic patches x 24 hours when visiting family out of town, with GI upset and abdominal pain then. When she had been on correct schedule of duragesic 75 mcg, she recently needed only ~ 1 dilaudid daily, for bilateral low pelvic pain Appetite otherwise has beena little better, tho she is still not using supplements and frequently eats only once daily. She has had more regular bowel movements. She has had no bleeding She is more active at home, including sweeping and walking brother's dog.  She does not have PAC She has bilateral JJ stents  ONCOLOGIC HISTORY Patient presented with heavy vaginal bleeding, with low back pain and right hip pain. She was seen by Dr Dellis Filbert, with biopsies from upper vagina and lower anterior vagina showing invasive squamous cell carcinoma. 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 She saw Dr Josephina Shih 01-27-13, with cervix  replaced by fungating tumor extending to both pelvic sidewalls and nodular lesion halfway down anterior vaginal wall. Right hip xray 01-27-13 showed no bony mets. PET 02-10-13 at [facility] had new right hydronephrosis in addition to the previous left hydronephrosis, uptake in para-aortic nodes, left iliac nodes, bulky cervical mass, adenopathy adjacent to rectum; lungs and bones ok by PET. Bilateral JJ stents were placed 02-17-12. First CDDP was given 02-20-13. Chemo held 2-16 with acute pain and inadequate hydration; transfused 1 unit PRBCs 2-16 for Hgb 9.6. She had second CDDP on 03-10-13, then chemo held following week due to low counts. External beam RT was given 02-13-13 thru 03-17-13, 45 gray to pelvis and periaortics.  Review of systems as above, also: Stool caliber increased back to normal size. Occasional hot flashes. Still discomfort at end of urination that seems stent related. No SOB or cough Remainder of 10 point Review of Systems negative.  Objective:  Vital signs in last 24 hours:  BP 134/85  Pulse 105  Temp(Src) 97.7 F (36.5 C) (Oral)  Resp 18  Ht 5\' 7"  (1.702 m)  Wt 167 lb 8 oz (75.978 kg)  BMI 26.23 kg/m2 Weight is stable, which is the first time that she has not lost weight Alert, oriented and appropriate. More easily ambulatory.  No alopecia  HEENT:PERRL, sclerae not icteric. Oral mucosa moist without lesions, posterior pharynx clear.  Neck supple. No JVD.  Lymphatics:no cervical,suraclavicular inguinal adenopathy Resp: clear to auscultation bilaterally and normal percussion bilaterally Cardio: regular rate and rhythm. No gallop. GI: soft, nontender, not distended, no mass or organomegaly. A few bowel sounds.  Musculoskeletal/ Extremities: without pitting edema, cords, tenderness other than slight cord vs valves along vein  right forearm, no erythema and really not tender Neuro: no peripheral neuropathy. Otherwise nonfocal Skin without rash, ecchymosis, petechiae   Lab  Results:  Results for orders placed in visit on 04/19/13  CBC WITH DIFFERENTIAL      Result Value Ref Range   WBC 6.4  3.9 - 10.3 10e3/uL   NEUT# 5.3  1.5 - 6.5 10e3/uL   HGB 11.7  11.6 - 15.9 g/dL   HCT 36.0  34.8 - 46.6 %   Platelets 278  145 - 400 10e3/uL   MCV 88.8  79.5 - 101.0 fL   MCH 28.8  25.1 - 34.0 pg   MCHC 32.5  31.5 - 36.0 g/dL   RBC 4.05  3.70 - 5.45 10e6/uL   RDW 18.9 (*) 11.2 - 14.5 %   lymph# 0.3 (*) 0.9 - 3.3 10e3/uL   MONO# 0.5  0.1 - 0.9 10e3/uL   Eosinophils Absolute 0.2  0.0 - 0.5 10e3/uL   Basophils Absolute 0.0  0.0 - 0.1 10e3/uL   NEUT% 84.2 (*) 38.4 - 76.8 %   LYMPH% 4.7 (*) 14.0 - 49.7 %   MONO% 8.3  0.0 - 14.0 %   EOS% 2.5  0.0 - 7.0 %   BASO% 0.3  0.0 - 2.0 %  COMPREHENSIVE METABOLIC PANEL (TD32)      Result Value Ref Range   Sodium 137  136 - 145 mEq/L   Potassium 4.6  3.5 - 5.1 mEq/L   Chloride 101  98 - 109 mEq/L   CO2 28  22 - 29 mEq/L   Glucose 152 (*) 70 - 140 mg/dl   BUN 7.3  7.0 - 26.0 mg/dL   Creatinine 1.1  0.6 - 1.1 mg/dL   Total Bilirubin 0.24  0.20 - 1.20 mg/dL   Alkaline Phosphatase 81  40 - 150 U/L   AST 11  5 - 34 U/L   ALT <6  0 - 55 U/L   Total Protein 7.0  6.4 - 8.3 g/dL   Albumin 2.9 (*) 3.5 - 5.0 g/dL   Calcium 10.1  8.4 - 10.4 mg/dL   Anion Gap 8  3 - 11 mEq/L   Chemistries resulted after visit and will be discussed with patient by phone  Studies/Results:  No results found.  Medications: I have reviewed the patient's current medications. She will stay on duragesic 75 for total 6 days, then will try decrease to 50 mcg patch (this will leave one 75 mcg patch available). She has #22 dilaudid presently; prescription for #5 duragesic 50 mcg today  DISCUSSION: patient would like to return to work in next week for a few hours daily, which will help with additional time off from here. I have told her that she needs to be eating at least 3x daily in order to go back to work  Assessment/Plan:  1.clinical IIIB invasive  squamous cell carcinoma of cervix with bilateral hydronephrosis, vaginal involvement and periaortic, external iliac and perirectal involvement. External beam RT completed 03-17-13, with only 2 cycles of CDDP possible during that treatment. Follow up with Dr Sondra Come 4-16 and Dr Josephina Shih 5-15. I will see her back as appropriate following other MD visits upcoming. 2.JJ stents for bilateral ureteral obstruction. Note she is for stent change by Dr Louis Meckel 05-12-13.  If repeat scans appropriate after she sees Dr Sondra Come, these may be useful for Dr Louis Meckel also.  3.iron deficiency anemia related to vaginal bleeding. Continue oral iron. Transfused 1 unit PRBCs 02-27-13. Hgb better today  at 11.7 4.poor nutritional status/ protein calorie malnutrition in setting of chronic illness. Some improvement but not adequate yet 5.never colonoscopy/ ? if mammograms    Patient understands discussion and plan, and is in agreement    Gordy Levan, MD   04/19/2013, 3:10 PM

## 2013-04-20 ENCOUNTER — Telehealth: Payer: Self-pay

## 2013-04-20 NOTE — Progress Notes (Signed)
Clinical Social Worker Intern spoke with Patient just to check in.  Patient was "feeling better and doing a little work".  Patient stated she has been able to focus on her self-care.    [place] Work Intern [employer] [phone]

## 2013-04-20 NOTE — Telephone Encounter (Signed)
Message copied by Baruch Merl on Thu Apr 20, 2013  5:24 PM ------      Message from: Evlyn Clines P      Created: Wed Apr 19, 2013  3:49 PM       Labs seen and need follow up: please let her know BUN and creatinine holding steady at 7.3 and 1.1 -- ok. Sodium is fine at 137.      Albumin is better at 2.9, which shows nutrition is some better.       ------

## 2013-04-20 NOTE — Telephone Encounter (Signed)
Ms. Specht would like to return to work on 04-26-13.  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 Told her to call Monday 04-24-13 and let Dr. Mariana Kaufman nurse know if she has been eating three regular meals which is a goal for returning to work.

## 2013-04-20 NOTE — Telephone Encounter (Signed)
Discussed the c-met results as noted below by Dr. Marko Plume.  Patient verbalized understanding.

## 2013-04-24 ENCOUNTER — Telehealth: Payer: Self-pay | Admitting: *Deleted

## 2013-04-24 NOTE — Telephone Encounter (Signed)
Pt called to say she is doing well, eating 3 meals a day. Return to work letter faxed to patient's home

## 2013-04-26 ENCOUNTER — Telehealth: Payer: Self-pay | Admitting: Dietician

## 2013-04-26 ENCOUNTER — Encounter: Payer: Self-pay | Admitting: Oncology

## 2013-04-26 NOTE — Telephone Encounter (Signed)
Brief Outpatient Oncology Nutrition Note  Patient has been identified to be at risk on malnutrition screen.  Wt Readings from Last 10 Encounters:  04/19/13 167 lb 8 oz (75.978 kg)  04/05/13 167 lb 8 oz (75.978 kg)  03/22/13 171 lb 1.6 oz (77.61 kg)  03/17/13 174 lb 11.2 oz (79.243 kg)  03/14/13 174 lb (78.926 kg)  03/10/13 183 lb 6.4 oz (83.19 kg)  03/08/13 188 lb 8 oz (85.503 kg)  03/07/13 186 lb 9.6 oz (84.641 kg)  03/03/13 185 lb (83.915 kg)  02/28/13 186 lb 11.2 oz (84.687 kg)   Dx:  Cervical cancer.  Patient of Dr. Marko Plume.  Called patient due to weight loss.  Patient was unavailable.  Contact information for the [place] RD available.  Antonieta Iba, RD, LDN

## 2013-04-27 ENCOUNTER — Encounter: Payer: Self-pay | Admitting: Radiation Oncology

## 2013-04-27 ENCOUNTER — Ambulatory Visit
Admission: RE | Admit: 2013-04-27 | Discharge: 2013-04-27 | Disposition: A | Discharge status: Home / Self Care | Payer: BC Managed Care – PPO | Source: Ambulatory Visit | Attending: Radiation Oncology | Admitting: Radiation Oncology

## 2013-04-27 VITALS — BP 139/87 | HR 88 | Temp 97.3°F | Ht 67.0 in | Wt 165.7 lb

## 2013-04-27 DIAGNOSIS — C539 Malignant neoplasm of cervix uteri, unspecified: Secondary | ICD-10-CM

## 2013-04-27 NOTE — Progress Notes (Signed)
Radiation Oncology         [phone] ________________________________  Name: Gina Barnett MRN: [id number]  Date: 04/27/2013  DOB: 1955-11-05  Follow-Up Visit Note  CC:  Gina Crutch, MD  Gina Barnett *  Diagnosis:   Cervix cancer   Primary site: Cervix Uteri   Staging method: AJCC 7th Edition   Clinical: Stage IIIB (T3b, N1, M0)   Summary: Stage IIIB (T3b, N1, M0)   Interval Since Last Radiation:  6  weeks  Narrative:  The patient returns today for routine follow-up.  She is feeling a little better. She has less pain in the pelvis region. She denies any vaginal bleeding at this time or hematuria. She denies any rectal bleeding. She is having less pressure with bowel movements suggesting tumor shrinkage. Stool caliber has also improved.  She does have some pain in the right flank area which she feels is related to her stent.                              ALLERGIES:  is allergic to latex and penicillins.  Meds: Current Outpatient Prescriptions  Medication Sig Dispense Refill  . albuterol (PROVENTIL HFA;VENTOLIN HFA) 108 (90 BASE) MCG/ACT inhaler Inhale into the lungs every 6 (six) hours as needed for wheezing or shortness of breath.      . bisacodyl (DULCOLAX) 5 MG EC tablet Take 5 mg by mouth daily as needed for moderate constipation.      . Cholecalciferol (VITAMIN D3) 5000 UNITS CAPS Take 1 capsule by mouth daily. Take Monday thru Friday      . clobetasol cream (TEMOVATE) 5.85 % Apply 1 application topically 2 (two) times daily as needed.       . fentaNYL (DURAGESIC - DOSED MCG/HR) 50 MCG/HR Place 1 patch (50 mcg total) onto the skin every 3 (three) days.  5 patch  0  . folic acid (FOLVITE) 1 MG tablet Take 1 mg by mouth daily.      Marland Kitchen ibuprofen (ADVIL,MOTRIN) 200 MG tablet Take 200 mg by mouth every 6 (six) hours as needed.      Marland Kitchen LORazepam (ATIVAN) 1 MG tablet Take 1 tablet under tongue or swallow every 6 hours as needed for nausea. Will make drowsy.  30 tablet  0  .  magnesium hydroxide (MILK OF MAGNESIA) 400 MG/5ML suspension Take 30 mLs by mouth daily as needed for mild constipation.      . Meth-Hyo-M Bl-Na Phos-Ph Sal (URELLE PO) Take 1 tablet by mouth 4 (four) times daily as needed (prescribed by Dr Gina Barnett 03/11/13.).      Marland Kitchen ondansetron (ZOFRAN) 8 MG tablet Take 1-2 tablets (8-16 mg total) by mouth every 12 (twelve) hours as needed for nausea or vomiting.  30 tablet  1  . polyethylene glycol (MIRALAX / GLYCOLAX) packet Take 17 g by mouth daily.      . tamsulosin (FLOMAX) 0.4 MG CAPS capsule Take 0.4 mg by mouth.      . triamcinolone cream (KENALOG) 0.1 % Apply 1 application topically as needed.      Marland Kitchen HYDROmorphone (DILAUDID) 4 MG tablet Take one 1-2  tablet by mouth every 4 to 6 hours as needed for breakthrough pain  30 tablet  0  . vitamin B-12 (CYANOCOBALAMIN) 1000 MCG tablet Take 1,000 mcg by mouth daily.       No current facility-administered medications for this encounter.    Physical Findings: The  patient is in no acute distress. Patient is alert and oriented.  height is 5\' 7"  (1.702 m) and weight is 165 lb 11.2 oz (75.161 kg). Her temperature is 97.3 F (36.3 C). Her blood pressure is 139/87 and her pulse is 88. .  No no palpable supraclavicular or axillary adenopathy. The lungs are clear to auscultation. The heart has regular rhythm and rate. The abdomen is soft and nontender with normal bowel sounds. The inguinal areas are free of adenopathy. On pelvic examination the external genitalia are unremarkable. Pelvic exam is performed.. The vaginal vault continues to be significantly narrowed but would permit index finger examination. She continues to have palpable disease along the anterior vaginal wall. There is palpable nodularity in the region of the cervix, proximal vagina.  with speculum exam I was unable to visualize the cervical region. Rectal sphincter tone noted to be normal. No palpable endorectal mass.  Lab Findings: Lab Results    Component Value Date   WBC 6.4 04/19/2013   HGB 11.7 04/19/2013   HCT 36.0 04/19/2013   MCV 88.8 04/19/2013   PLT 278 04/19/2013      Radiographic Findings: No results found.  Impression:  The patient is recovering from the effects of radiation.  She appears to have had some tumor shrinkage but still continues to have significant disease by clinical exam today. Patient will be set up for evaluation at [facility] for low dose rate implant.  We'll also schedule the patient for CT scans of the chest abdomen and pelvis.  Plan:  Routine followup in 3 months. Patient will have her urinary stent changed on may first. She will also be seen by Dr. Aldean Barnett next month.  ____________________________________ Gina Promise, MD

## 2013-04-27 NOTE — Addendum Note (Signed)
Encounter addended by: Blair Promise, MD on: 04/27/2013  4:21 PM<BR>     Documentation filed: Orders

## 2013-04-27 NOTE — Progress Notes (Unsigned)
Gina Barnett to pick up return to work letter from front desk.

## 2013-04-27 NOTE — Progress Notes (Signed)
Gina Barnett here for follow up after treatment to her pelvis and periaortic area.  She is having pain in her right lower back to her stent.  She is rating it at 6/10.  She is only using a fentanyl patch 50 mcg and ibuprofen.  She denies hematuria and vaginal bleeding.  She thinks she skipped her menstrual period which was due at the end of March.  She reports occasional nausea and vomiting.  She is taking ativan for nausea.  She has lost 2 lbs since 4/8.  She reports her appetite is ok some days.   She reports constipation alternating with frequent bowel movements.  She is trying to regulate them with milk of magnesia and miralax.  She reports fatigue.  She will be going back to work soon.

## 2013-04-28 ENCOUNTER — Telehealth: Payer: Self-pay | Admitting: *Deleted

## 2013-04-28 NOTE — Telephone Encounter (Signed)
CALLED PATIENT TO INFORM OF TESTS AND FU, SPOKE WITH PATIENT AND SHE IS AWARE OF THESE APPTS. 

## 2013-05-05 ENCOUNTER — Other Ambulatory Visit: Payer: Self-pay | Admitting: *Deleted

## 2013-05-05 ENCOUNTER — Telehealth: Payer: Self-pay | Admitting: Oncology

## 2013-05-05 ENCOUNTER — Ambulatory Visit (HOSPITAL_COMMUNITY)
Admission: RE | Admit: 2013-05-05 | Discharge: 2013-05-05 | Disposition: A | Discharge status: Home / Self Care | Payer: BC Managed Care – PPO | Source: Ambulatory Visit | Attending: Radiation Oncology | Admitting: Radiation Oncology

## 2013-05-05 ENCOUNTER — Other Ambulatory Visit: Payer: Self-pay | Admitting: Oncology

## 2013-05-05 ENCOUNTER — Other Ambulatory Visit: Payer: Self-pay | Admitting: Radiation Oncology

## 2013-05-05 ENCOUNTER — Encounter (HOSPITAL_COMMUNITY): Payer: Self-pay

## 2013-05-05 DIAGNOSIS — C539 Malignant neoplasm of cervix uteri, unspecified: Secondary | ICD-10-CM

## 2013-05-05 DIAGNOSIS — Z923 Personal history of irradiation: Secondary | ICD-10-CM | POA: Insufficient documentation

## 2013-05-05 MED ORDER — FENTANYL 75 MCG/HR TD PT72: 75.0000 ug | MEDICATED_PATCH | TRANSDERMAL | Status: DC

## 2013-05-05 MED ORDER — IOHEXOL 300 MG/ML  SOLN
100.0000 mL | Freq: Once | INTRAMUSCULAR | Status: AC | PRN
  Administered 2013-05-05: 100 mL via INTRAVENOUS

## 2013-05-05 MED ORDER — HYDROMORPHONE HCL 4 MG PO TABS: ORAL_TABLET | ORAL | Status: DC

## 2013-05-05 NOTE — Telephone Encounter (Signed)
, °

## 2013-05-05 NOTE — Telephone Encounter (Signed)
Patient came to [facility] today with complaints of nausea, constipation and pain control. Patient was a walk-in. Reviewed complaints with Dr. Marko Plume. Per Dr. Marko Plume, we increased the Fentanyl patch from 50 to 75 mcg. Also, gave her Dilaudid 4 mg tablets #10. Also, informed patient that when you are on narcotics that increases your constipation. Per Dr. Marko Plume, double the Miralax. Dr. Marko Plume has put in a POF to see the patient on Monday, 05/08/13. Gave schedule to patient. Patient verbalized understanding of pain medications and schedule.

## 2013-05-07 ENCOUNTER — Other Ambulatory Visit: Payer: Self-pay | Admitting: Oncology

## 2013-05-08 ENCOUNTER — Other Ambulatory Visit (HOSPITAL_BASED_OUTPATIENT_CLINIC_OR_DEPARTMENT_OTHER): Payer: BC Managed Care – PPO

## 2013-05-08 ENCOUNTER — Telehealth: Payer: Self-pay | Admitting: Oncology

## 2013-05-08 ENCOUNTER — Ambulatory Visit (HOSPITAL_BASED_OUTPATIENT_CLINIC_OR_DEPARTMENT_OTHER): Payer: BC Managed Care – PPO | Admitting: Oncology

## 2013-05-08 ENCOUNTER — Encounter: Payer: Self-pay | Admitting: Oncology

## 2013-05-08 VITALS — BP 154/80 | HR 86 | Temp 98.2°F | Resp 20 | Ht 67.0 in | Wt 166.1 lb

## 2013-05-08 DIAGNOSIS — C539 Malignant neoplasm of cervix uteri, unspecified: Secondary | ICD-10-CM

## 2013-05-08 DIAGNOSIS — D500 Iron deficiency anemia secondary to blood loss (chronic): Secondary | ICD-10-CM

## 2013-05-08 DIAGNOSIS — C792 Secondary malignant neoplasm of skin: Secondary | ICD-10-CM

## 2013-05-08 DIAGNOSIS — C50919 Malignant neoplasm of unspecified site of unspecified female breast: Secondary | ICD-10-CM

## 2013-05-08 DIAGNOSIS — C779 Secondary and unspecified malignant neoplasm of lymph node, unspecified: Secondary | ICD-10-CM

## 2013-05-08 DIAGNOSIS — E46 Unspecified protein-calorie malnutrition: Secondary | ICD-10-CM

## 2013-05-08 LAB — BASIC METABOLIC PANEL (CC13)
Anion Gap: 12 mEq/L — ABNORMAL HIGH (ref 3–11)
BUN: 7.3 mg/dL (ref 7.0–26.0)
CHLORIDE: 100 meq/L (ref 98–109)
CO2: 28 meq/L (ref 22–29)
Calcium: 10.2 mg/dL (ref 8.4–10.4)
Creatinine: 1 mg/dL (ref 0.6–1.1)
GLUCOSE: 100 mg/dL (ref 70–140)
Potassium: 4.8 mEq/L (ref 3.5–5.1)
SODIUM: 140 meq/L (ref 136–145)

## 2013-05-08 LAB — CBC WITH DIFFERENTIAL/PLATELET
BASO%: 0.3 % (ref 0.0–2.0)
Basophils Absolute: 0 10*3/uL (ref 0.0–0.1)
EOS ABS: 0.5 10*3/uL (ref 0.0–0.5)
EOS%: 8.7 % — ABNORMAL HIGH (ref 0.0–7.0)
HEMATOCRIT: 35.4 % (ref 34.8–46.6)
HGB: 11.6 g/dL (ref 11.6–15.9)
LYMPH#: 0.4 10*3/uL — AB (ref 0.9–3.3)
LYMPH%: 6.5 % — ABNORMAL LOW (ref 14.0–49.7)
MCH: 29.5 pg (ref 25.1–34.0)
MCHC: 32.8 g/dL (ref 31.5–36.0)
MCV: 90.1 fL (ref 79.5–101.0)
MONO#: 0.4 10*3/uL (ref 0.1–0.9)
MONO%: 7.5 % (ref 0.0–14.0)
NEUT%: 77 % — AB (ref 38.4–76.8)
NEUTROS ABS: 4.5 10*3/uL (ref 1.5–6.5)
Platelets: 236 10*3/uL (ref 145–400)
RBC: 3.93 10*6/uL (ref 3.70–5.45)
RDW: 18.4 % — ABNORMAL HIGH (ref 11.2–14.5)
WBC: 5.9 10*3/uL (ref 3.9–10.3)

## 2013-05-08 NOTE — Progress Notes (Signed)
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 She completed external beam RT on 03-17-13, this given with only 2 cycles of sensitizing CDDP due to complicating problems thru that treatment. She had restaging CT CAP 05-05-13, with significant improvement thruout, including resolution of retroperitoneal adenopathy and improvement in right pelvic sidewall disease, with cervical mass now 3.3 x 4 cm as compared with 10.8 x 8.7 cm piror, also resolution of presacral adenopathy. She saw Dr Sondra Come on 04-27-13, still with palpable disease involving anterior vaginal wall and proximal vagina; he plans referral to [facility] for low dose rate implant. She is to see Dr Josephina Shih on 05-26-13.  She is for stent exchange or removal by Dr Louis Meckel on 05-12-13. I have told patient that we need to defer decision on stents to Drs Sondra Come and Louis Meckel, as I do not know if better to leave those in with upcoming [facility] treatments.  She does not have PAC. She has bilateral JJ stents  She has had no bleeding. She is more comfortable back on duragesic 75 mcg patches, has not needed prn dilaudid in last 24 hours. She feels that the dilaudid causes drowsiness, nausea and worsens constipation, tho that may not be exactly correct. She has eaten very little by time of afternoon appointment today "I am never a morning eater" and we have discussed again. She has not felt well enough to return to work, tho she is tolerating more activity. Pain is low pelvis. She denies fever or hematuria/ increased dysuria.    ONCOLOGIC HISTORY Patient presented with heavy vaginal bleeding, with low back pain and right hip pain. She was seen by Dr Dellis Filbert, with biopsies from upper vagina and lower  anterior vagina showing invasive squamous cell carcinoma. 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 She saw Dr Josephina Shih 01-27-13, with cervix replaced by fungating tumor extending to both pelvic sidewalls and nodular lesion halfway down anterior vaginal wall. Right hip xray 01-27-13 showed no bony mets. PET 02-10-13 at [facility] had new right hydronephrosis in addition to the previous left hydronephrosis, uptake in para-aortic nodes, left iliac nodes, bulky cervical mass, adenopathy adjacent to rectum; lungs and bones ok by PET. Bilateral JJ stents were placed 02-17-12. First CDDP was given 02-20-13. Chemo held 2-16 with acute pain and inadequate hydration; transfused 1 unit PRBCs 2-16 for Hgb 9.6. She had second CDDP on 03-10-13, then chemo held following week due to low counts. External beam RT was given 02-13-13 thru 03-17-13, 45 gray to pelvis and periaortics.  Review of systems as above, also: No fever. Able to sleep adequately. No LE swelling. Remainder of 10 point Review of Systems negative.  Objective:  Vital signs in last 24 hours:  BP 154/80  Pulse 86  Temp(Src) 98.2 F (36.8 C) (Oral)  Resp 20  Ht 5\' 7"  (1.702 m)  Wt 166 lb 1.6 oz (75.342 kg)  BMI 26.01 kg/m2  LMP 03/11/2013 Weight is stable Alert, oriented and appropriate. Ambulatory without difficulty.    HEENT:PERRL, sclerae not icteric. Oral mucosa moist without lesions, posterior  pharynx clear. Mucous membranes not pale Neck supple. No JVD.  Lymphatics:no cervical,suraclavicular, axillary or inguinal adenopathy Resp: clear to auscultation bilaterally and normal percussion bilaterally Cardio: regular rate and rhythm. No gallop. GI: soft,  nontender, not distended, no mass or organomegaly. Few bowel sounds.  Musculoskeletal/ Extremities: without pitting edema, cords, tenderness Neuro: no peripheral neuropathy. Otherwise nonfocal Skin without rash, ecchymosis, petechiae   Lab Results:  Results for orders placed in visit on 05/08/13  CBC WITH DIFFERENTIAL      Result Value Ref Range   WBC 5.9  3.9 - 10.3 10e3/uL   NEUT# 4.5  1.5 - 6.5 10e3/uL   HGB 11.6  11.6 - 15.9 g/dL   HCT 35.4  34.8 - 46.6 %   Platelets 236  145 - 400 10e3/uL   MCV 90.1  79.5 - 101.0 fL   MCH 29.5  25.1 - 34.0 pg   MCHC 32.8  31.5 - 36.0 g/dL   RBC 3.93  3.70 - 5.45 10e6/uL   RDW 18.4 (*) 11.2 - 14.5 %   lymph# 0.4 (*) 0.9 - 3.3 10e3/uL   MONO# 0.4  0.1 - 0.9 10e3/uL   Eosinophils Absolute 0.5  0.0 - 0.5 10e3/uL   Basophils Absolute 0.0  0.0 - 0.1 10e3/uL   NEUT% 77.0 (*) 38.4 - 76.8 %   LYMPH% 6.5 (*) 14.0 - 49.7 %   MONO% 7.5  0.0 - 14.0 %   EOS% 8.7 (*) 0.0 - 7.0 %   BASO% 0.3  0.0 - 2.0 %  BASIC METABOLIC PANEL (TK35)      Result Value Ref Range   Sodium 140  136 - 145 mEq/L   Potassium 4.8 Sl. Hemolysis  3.5 - 5.1 mEq/L   Chloride 100  98 - 109 mEq/L   CO2 28  22 - 29 mEq/L   Glucose 100  70 - 140 mg/dl   BUN 7.3  7.0 - 26.0 mg/dL   Creatinine 1.0  0.6 - 1.1 mg/dL   Calcium 10.2  8.4 - 10.4 mg/dL   Anion Gap 12 (*) 3 - 11 mEq/L     Studies/Results: EXAM:  CT CHEST, ABDOMEN, AND PELVIS WITH CONTRAST  05-05-13   COMPARISON: 01/25/2013 diagnostic CTs. PET of 02/07/2013.  FINDINGS:  CT CHEST FINDINGS  Lungs/Pleura: Since 01/25/2013, clearing of patchy left base  airspace disease. Resolution of previously described subpleural 4 mm  density in the right lower lobe.  No pleural fluid.  Heart/Mediastinum: No supraclavicular adenopathy. Normal heart size,  without pericardial effusion. No axillary adenopathy. The  hypermetabolic nodes described on prior PET are decreased in size.  The largest node measures 6 mm today on image 22  versus 1.3 cm on  the prior.  No central pulmonary embolism, on this non-dedicated study. No  mediastinal or hilar adenopathy.  CT ABDOMEN AND PELVIS FINDINGS  Abdomen/Pelvis: 1.0 cm right hepatic lobe lesion on image 60,  similar.  A right hepatic lobe too small to characterize lesion on image 56  may have been obscured by metal artifact in this area on the prior  exam. Mild focal steatosis adjacent to falciform ligament.  Normal spleen, stomach. Descending duodenal diverticulum. Normal  pancreas, gallbladder, biliary tract, adrenal glands.  Mild left renal cortical atrophy. Interval placement of a left  ureteric stent since 01/25/2013. No residual hydronephrosis.  Right-sided ureteric stent also in place, without hydroureter. The  proximal portion of the stent is looped in the lower pole right  renal collecting system.  Moderate right-sided caliectasis,  especially involving the upper pole.  Resolution of previously described retroperitoneal adenopathy.  Colonic diverticulosis with probable muscular hypertrophy involving  the sigmoid. Colonic stool burden suggests constipation. Normal  terminal ileum. Normal small bowel without abdominal ascites.  Improvement in right pelvic sidewall adenopathy. Nodal tissue  measures 1.6 x 2.5 cm on image 101 versus 4.5 x 3.6 cm on the prior  CT.  Resolution of bilateral inguinal adenopathy.  Both ureteric stents terminating within the urinary bladder. Small  uterine fundal fibroid. Marked improvement in soft tissue fullness  involving the uterine cervix. Ill-defined hypoattenuation measures  3.3 x 4.0 cm on image 110. 10.8 x 8.7 cm on the prior head. Trace  cul-de-sac fluid. Resolution of presacral adenopathy.  Bones/Musculoskeletal: Mild convex right lumbar spine curvature.  Degenerative disc disease at the lumbosacral junction and L3-4  level.  IMPRESSION:  CT CHEST IMPRESSION  1. No acute process or evidence of metastatic disease in the  chest.  2. Decreased size of presumably reactive right axillary nodes since  prior CT and PET.  3. Resolution of left lower lobe pneumonia.  CT ABDOMEN AND PELVIS IMPRESSION  1. Marked response to therapy of cervical primary and abdominal  pelvic adenopathy.  2. Placement of bilateral ureteric stents. Residual caliectasis  involving the right kidney, especially the inter and upper pole  regions. The stent is looped in the lower pole right renal  collecting system.  3. Uterine fibroid.  4. Trace cul-de-sac fluid.  5. Similar right hepatic lobe low-density lesion. Given absence of  activity on prior PET, favor a cyst or minimally complex cyst.  Recommend attention on follow-up.   CT findings discussed.   Medications: I have reviewed the patient's current medications. Continue duragesic at 75 for now with prn dilaudid.   DISCUSSION: message to Dr Sondra Come re stent procedure upcoming; cc this note and CT report to Dr Louis Meckel. Patient understands that she will be scheduled at [facility] per Dr Sondra Come. Overall she has had good response to radiation with minimal CDDP, and could have further chemo if needed later.  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 External beam RT completed 03-17-13, with only 2 cycles of CDDP possible during that treatment. For low dose rate implant at [facility] per Dr Sondra Come; to see Dr Josephina Shih 5-15. I will see her late June or sooner if needed. She will let us know how pain is and we can try to decrease duragesic again when appropriate. 2.JJ stents for bilateral ureteral obstruction. Note she is for stent change by Dr Louis Meckel 05-12-13. Information from this CT to Drs Sondra Come and Louis Meckel 3.iron deficiency anemia related to vaginal bleeding. Continue oral iron.  4.poor nutritional status/ protein calorie malnutrition in setting of chronic  illness. Weight stable today, and patient is aware of recommendations. 5.never colonoscopy/ ? if mammograms     Patient is in agreement with plan above.   Gordy Levan, MD   05/08/2013, 1:45 PM

## 2013-05-08 NOTE — Telephone Encounter (Signed)
per pof sch appt/printed and gave copy of sch to pt

## 2013-05-10 ENCOUNTER — Encounter (HOSPITAL_BASED_OUTPATIENT_CLINIC_OR_DEPARTMENT_OTHER): Payer: Self-pay | Admitting: *Deleted

## 2013-05-10 NOTE — Progress Notes (Signed)
NPO AFTER MN. ARRIVE AT 2111. NEEDS URINE CULTURE.  CURRENT LAB RESULTS IN CHART AND EPIC.

## 2013-05-12 ENCOUNTER — Encounter (HOSPITAL_BASED_OUTPATIENT_CLINIC_OR_DEPARTMENT_OTHER): Payer: BC Managed Care – PPO | Admitting: Anesthesiology

## 2013-05-12 ENCOUNTER — Ambulatory Visit (HOSPITAL_BASED_OUTPATIENT_CLINIC_OR_DEPARTMENT_OTHER)
Admission: RE | Admit: 2013-05-12 | Discharge: 2013-05-12 | Disposition: A | Discharge status: Home / Self Care | Payer: BC Managed Care – PPO | Source: Ambulatory Visit | Attending: Urology | Admitting: Urology

## 2013-05-12 ENCOUNTER — Encounter (HOSPITAL_BASED_OUTPATIENT_CLINIC_OR_DEPARTMENT_OTHER): Payer: Self-pay | Admitting: Anesthesiology

## 2013-05-12 ENCOUNTER — Ambulatory Visit (HOSPITAL_BASED_OUTPATIENT_CLINIC_OR_DEPARTMENT_OTHER): Payer: BC Managed Care – PPO | Admitting: Anesthesiology

## 2013-05-12 ENCOUNTER — Encounter (HOSPITAL_BASED_OUTPATIENT_CLINIC_OR_DEPARTMENT_OTHER)
Admission: RE | Disposition: A | Discharge status: Home / Self Care | Payer: Self-pay | Source: Ambulatory Visit | Attending: Urology

## 2013-05-12 DIAGNOSIS — N1330 Unspecified hydronephrosis: Secondary | ICD-10-CM | POA: Insufficient documentation

## 2013-05-12 DIAGNOSIS — N135 Crossing vessel and stricture of ureter without hydronephrosis: Secondary | ICD-10-CM | POA: Insufficient documentation

## 2013-05-12 DIAGNOSIS — J45909 Unspecified asthma, uncomplicated: Secondary | ICD-10-CM | POA: Insufficient documentation

## 2013-05-12 DIAGNOSIS — Z880 Allergy status to penicillin: Secondary | ICD-10-CM | POA: Insufficient documentation

## 2013-05-12 DIAGNOSIS — Z888 Allergy status to other drugs, medicaments and biological substances status: Secondary | ICD-10-CM | POA: Insufficient documentation

## 2013-05-12 DIAGNOSIS — C539 Malignant neoplasm of cervix uteri, unspecified: Secondary | ICD-10-CM | POA: Insufficient documentation

## 2013-05-12 DIAGNOSIS — Z79899 Other long term (current) drug therapy: Secondary | ICD-10-CM | POA: Insufficient documentation

## 2013-05-12 HISTORY — PX: CYSTOSCOPY WITH RETROGRADE PYELOGRAM, URETEROSCOPY AND STENT PLACEMENT: SHX5789

## 2013-05-12 HISTORY — DX: Personal history of other diseases of the female genital tract: Z87.42

## 2013-05-12 HISTORY — DX: Crossing vessel and stricture of ureter without hydronephrosis: N13.5

## 2013-05-12 SURGERY — CYSTOURETEROSCOPY, WITH RETROGRADE PYELOGRAM AND STENT INSERTION
Anesthesia: General | Site: Ureter | Laterality: Bilateral

## 2013-05-12 MED ORDER — BELLADONNA ALKALOIDS-OPIUM 16.2-60 MG RE SUPP
RECTAL | Status: AC
  Filled 2013-05-12: qty 1

## 2013-05-12 MED ORDER — SODIUM CHLORIDE 0.9 % IR SOLN
Status: DC | PRN
  Administered 2013-05-12: 2000 mL via INTRAVESICAL

## 2013-05-12 MED ORDER — ONDANSETRON HCL 4 MG/2ML IJ SOLN
INTRAMUSCULAR | Status: DC | PRN
  Administered 2013-05-12: 4 mg via INTRAVENOUS

## 2013-05-12 MED ORDER — MIDAZOLAM HCL 2 MG/2ML IJ SOLN
INTRAMUSCULAR | Status: AC
  Filled 2013-05-12: qty 2

## 2013-05-12 MED ORDER — FENTANYL CITRATE 0.05 MG/ML IJ SOLN
INTRAMUSCULAR | Status: DC | PRN
  Administered 2013-05-12 (×2): 12.5 ug via INTRAVENOUS
  Administered 2013-05-12: 50 ug via INTRAVENOUS
  Administered 2013-05-12 (×2): 12.5 ug via INTRAVENOUS

## 2013-05-12 MED ORDER — GLYCOPYRROLATE 0.2 MG/ML IJ SOLN
INTRAMUSCULAR | Status: DC | PRN
  Administered 2013-05-12: 0.2 mg via INTRAVENOUS

## 2013-05-12 MED ORDER — MIDAZOLAM HCL 5 MG/5ML IJ SOLN
INTRAMUSCULAR | Status: DC | PRN
  Administered 2013-05-12 (×2): 1 mg via INTRAVENOUS

## 2013-05-12 MED ORDER — FENTANYL CITRATE 0.05 MG/ML IJ SOLN
25.0000 ug | INTRAMUSCULAR | Status: DC | PRN
  Filled 2013-05-12: qty 1

## 2013-05-12 MED ORDER — DEXAMETHASONE SODIUM PHOSPHATE 4 MG/ML IJ SOLN
INTRAMUSCULAR | Status: DC | PRN
  Administered 2013-05-12: 10 mg via INTRAVENOUS

## 2013-05-12 MED ORDER — LIDOCAINE HCL (CARDIAC) 20 MG/ML IV SOLN
INTRAVENOUS | Status: DC | PRN
  Administered 2013-05-12: 60 mg via INTRAVENOUS

## 2013-05-12 MED ORDER — PROMETHAZINE HCL 25 MG/ML IJ SOLN
6.2500 mg | INTRAMUSCULAR | Status: DC | PRN
  Filled 2013-05-12: qty 1

## 2013-05-12 MED ORDER — FENTANYL CITRATE 0.05 MG/ML IJ SOLN
INTRAMUSCULAR | Status: AC
  Filled 2013-05-12: qty 4

## 2013-05-12 MED ORDER — IOHEXOL 350 MG/ML SOLN
INTRAVENOUS | Status: DC | PRN
  Administered 2013-05-12: 5 mL via INTRAVENOUS

## 2013-05-12 MED ORDER — LACTATED RINGERS IV SOLN
INTRAVENOUS | Status: DC
  Administered 2013-05-12: 09:00:00 via INTRAVENOUS
  Filled 2013-05-12: qty 1000

## 2013-05-12 MED ORDER — CIPROFLOXACIN IN D5W 400 MG/200ML IV SOLN
400.0000 mg | INTRAVENOUS | Status: AC
  Administered 2013-05-12: 400 mg via INTRAVENOUS
  Filled 2013-05-12: qty 200

## 2013-05-12 MED ORDER — LACTATED RINGERS IV SOLN
INTRAVENOUS | Status: DC | PRN
Start: 2013-05-12 — End: 2013-05-12
  Administered 2013-05-12: 09:00:00 via INTRAVENOUS

## 2013-05-12 MED ORDER — BELLADONNA ALKALOIDS-OPIUM 16.2-60 MG RE SUPP
RECTAL | Status: DC | PRN
  Administered 2013-05-12: 1 via RECTAL

## 2013-05-12 MED ORDER — PROPOFOL 10 MG/ML IV BOLUS
INTRAVENOUS | Status: DC | PRN
  Administered 2013-05-12: 200 mg via INTRAVENOUS

## 2013-05-12 MED ORDER — GENTAMICIN SULFATE 40 MG/ML IJ SOLN
330.0000 mg | INTRAVENOUS | Status: AC
  Administered 2013-05-12: 330 mg via INTRAVENOUS
  Filled 2013-05-12 (×2): qty 8.25

## 2013-05-12 MED ORDER — KETOROLAC TROMETHAMINE 30 MG/ML IJ SOLN
INTRAMUSCULAR | Status: DC | PRN
  Administered 2013-05-12: 30 mg via INTRAVENOUS

## 2013-05-12 MED ORDER — LIDOCAINE HCL 2 % EX GEL
CUTANEOUS | Status: DC | PRN
  Administered 2013-05-12: 1 via URETHRAL

## 2013-05-12 SURGICAL SUPPLY — 29 items
6 X 26 CONTOUR ×4 IMPLANT
BAG DRAIN URO-CYSTO SKYTR STRL (DRAIN) ×2 IMPLANT
BASKET LASER NITINOL 1.9FR (BASKET) IMPLANT
BASKET STNLS GEMINI 4WIRE 3FR (BASKET) IMPLANT
BASKET ZERO TIP NITINOL 2.4FR (BASKET) IMPLANT
CANISTER SUCT LVC 12 LTR MEDI- (MISCELLANEOUS) ×2 IMPLANT
CATH URET 5FR 28IN OPEN ENDED (CATHETERS) ×2 IMPLANT
CATH URET DUAL LUMEN 6-10FR 50 (CATHETERS) IMPLANT
CLOTH BEACON ORANGE TIMEOUT ST (SAFETY) ×2 IMPLANT
DRAPE CAMERA CLOSED 9X96 (DRAPES) ×2 IMPLANT
FIBER LASER FLEXIVA 200 (UROLOGICAL SUPPLIES) IMPLANT
FIBER LASER FLEXIVA 365 (UROLOGICAL SUPPLIES) IMPLANT
GLOVE SURG SS PI 7.0 STRL IVOR (GLOVE) ×2 IMPLANT
GLOVE SURG SS PI 7.5 STRL IVOR (GLOVE) ×2 IMPLANT
GOWN STRL REUS W/ TWL XL LVL3 (GOWN DISPOSABLE) ×1 IMPLANT
GOWN STRL REUS W/TWL XL LVL3 (GOWN DISPOSABLE) ×3 IMPLANT
GUIDEWIRE ANG ZIPWIRE 038X150 (WIRE) IMPLANT
GUIDEWIRE STR DUAL SENSOR (WIRE) ×4 IMPLANT
IV NS IRRIG 3000ML ARTHROMATIC (IV SOLUTION) IMPLANT
KIT BALLIN UROMAX 15FX10 (LABEL) IMPLANT
KIT BALLN UROMAX 15FX4 (MISCELLANEOUS) IMPLANT
KIT BALLN UROMAX 26 75X4 (MISCELLANEOUS)
NS IRRIG 500ML POUR BTL (IV SOLUTION) IMPLANT
PACK CYSTOSCOPY (CUSTOM PROCEDURE TRAY) ×2 IMPLANT
SET HIGH PRES BAL DIL (LABEL)
SHEATH ACCESS URETERAL 38CM (SHEATH) IMPLANT
SHEATH ACCESS URETERAL 54CM (SHEATH) IMPLANT
STENT URET 6FRX26 CONTOUR (STENTS) ×2 IMPLANT
TUBE FEEDING 8FR 16IN STR KANG (MISCELLANEOUS) IMPLANT

## 2013-05-12 NOTE — Discharge Instructions (Signed)
DISCHARGE INSTRUCTIONS FOR KIDNEY STONES OR URETERAL STENT   MEDICATIONS:  1. DO NOT RESUME YOUR ASPIRIN, or any other medicines like ibuprofen, motrin, excedrin, advil, aleve, vitamin E, fish oil as these can all cause bleeding x 7 days.  2. Resume all your other meds from home - except do not take any other pain meds that you may have at home.  3. Take Cipro one hour prior to removal of your stent.  4. Trospium is to prevent bladder spasms and help reduce urinary frequency. 5. Pyridium is to help with the burning/stinging when you urinate. 6. Tylenol with codeine is for moderate/severe pain, otherwise taking upto 1000mg  every 6 hours of plainTylenol will help treat your pain.  Do not take both at the same time.  ACTIVITY:  1. No strenuous activity x 1week  2. No driving while on narcotic pain medications  3. Drink plenty of water  4. Continue to walk at home - you can still get blood clots when you are at home, so keep active, but don't over do it.  5. May return to work/school tomorrow or when you feel ready   BATHING:  1. You can shower and we recommend daily showers  2. You have a string coming from your urethra: The stent string is attached to your ureteral stent. Do not pull on this.   SIGNS/SYMPTOMS TO CALL:  Please call us if you have a fever greater than 101.5, uncontrolled nausea/vomiting, uncontrolled pain, dizziness, unable to urinate, bloody urine, chest pain, shortness of breath, leg swelling, leg pain, redness around wound, drainage from wound, or any other concerns or questions.   You can reach [place] at [phone].   FOLLOW-UP:  1. We will schedule her again for stent exchange in 6 months. Post Anesthesia Home Care Instructions  Activity: Get plenty of rest for the remainder of the day. A responsible adult should stay with you for 24 hours following the procedure.  For the next 24 hours, DO NOT: -Drive a car -Paediatric nurse -Drink alcoholic beverages -Take any  medication unless instructed by your physician -Make any legal decisions or sign important papers.  Meals: Start with liquid foods such as gelatin or soup. Progress to regular foods as tolerated. Avoid greasy, spicy, heavy foods. If nausea and/or vomiting occur, drink only clear liquids until the nausea and/or vomiting subsides. Call your physician if vomiting continues.  Special Instructions/Symptoms: Your throat may feel dry or sore from the anesthesia or the breathing tube placed in your throat during surgery. If this causes discomfort, gargle with warm salt water. The discomfort should disappear within 24 hours.

## 2013-05-12 NOTE — Anesthesia Postprocedure Evaluation (Signed)
  Anesthesia Post-op Note  Patient: Gina Barnett  Procedure(s) Performed: Procedure(s) (LRB): BILATERAL STENT EXCHANGE/BILATERAL RETROGRADE PYELOGRAM (Bilateral)  Patient Location: PACU  Anesthesia Type: General  Level of Consciousness: awake and alert   Airway and Oxygen Therapy: Patient Spontanous Breathing  Post-op Pain: mild  Post-op Assessment: Post-op Vital signs reviewed, Patient's Cardiovascular Status Stable, Respiratory Function Stable, Patent Airway and No signs of Nausea or vomiting  Last Vitals:  Filed Vitals:   05/12/13 1045  BP: 124/69  Pulse: 80  Temp:   Resp: 13    Post-op Vital Signs: stable   Complications: No apparent anesthesia complications

## 2013-05-12 NOTE — H&P (Signed)
Reason For Visit Bladder spasms and stent discomfort   History of Present Illness This is a 58 year old female with stage IIIB cervical cancer with associated bilateral hydroureteronephrosis. She is status post bilateral ureteral stent placement on 02/16/13. She is discharged home with Trospium XR 60 mg daily for stent discomfort. I sense renewed her prescription for this. She presents today with pain secondary to her stents.   The patient's pain is a constant sharp pain. She does say that she has some relief when she takes the trospium. She started taking Flomax today. She also states that she has to strain to void during the day. However at nighttime she is able to void without straining. She denies any cramping intermittent pain, but more constant pain. She denies any urinary incontinence. She denies any fevers/chills. She was started on ciprofloxacin 500 mg twice a day by Dr. Marko Plume for repeat therapy of a UTI, the cultures have just returned today is negative.   Past Medical History Problems  1. History of asthma (V12.69) 2. History of cervical cancer (V10.41)  Surgical History Problems  1. History of Cystoscopy With Insertion Of Ureteral Stent Bilateral 2. History of Tonsillectomy  Current Meds 1. Albuterol Sulfate NEBU;  Therapy: (Recorded:02Feb2015) to Recorded 2. Ciprofloxacin HCl - 250 MG Oral Tablet;  Therapy: (Recorded:18Feb2015) to Recorded 3. Clobetasol Propionate 0.05 % External Cream;  Therapy: (Recorded:02Feb2015) to Recorded 4. Dulcolax Stool Softener CAPS;  Therapy: (Recorded:02Feb2015) to Recorded 5. HYDROmorphone HCl - 2 MG Oral Tablet;  Therapy: (Recorded:18Feb2015) to Recorded 6. Iron TABS;  Therapy: (Recorded:02Feb2015) to Recorded 7. LORazepam 1 MG Oral Tablet;  Therapy: (Recorded:02Feb2015) to Recorded 8. MiraLax Oral Packet;  Therapy: (Recorded:02Feb2015) to Recorded 9. Ondansetron 8 MG Oral Tablet Dispersible;  Therapy: (Recorded:02Feb2015) to  Recorded 10. Tamsulosin HCl - 0.4 MG Oral Capsule;   Therapy: (Recorded:18Feb2015) to Recorded 11. Triamcinolone Acetonide 0.1 % External Cream;   Therapy: (Recorded:02Feb2015) to Recorded 12. Trospium Chloride ER 60 MG Oral Capsule Extended Release 24 Hour;   Therapy: (Recorded:13Feb2015) to Recorded 13. Vitamin D TABS;   Therapy: (Recorded:02Feb2015) to Recorded  Allergies Medication  1. Penicillins 2. Lipitor TABS  Family History Problems  1. Family history of hypertension (V17.49) : Father 2. Family history of Heart Valve Replacement : Father, Mother  Social History Problems  1. Denied: History of Alcohol use 2. Caffeine use (V49.89) 3. Death in the family, father   AGE 40 multiple organ failure 4. Never a smoker (V49.89) 5. Occupation   Physical Therapist 6. Single  Review of Systems The patient is complaining of diarrhea, weakness, fatigue, poor appetite.   Vitals Vital Signs [Data Includes: Last 1 Day]  Recorded: [id number] 12:39PM  Height: 5 ft 7 in Weight: 184 lb  BMI Calculated: 28.82 BSA Calculated: 1.95 Blood Pressure: 124 / 78 Temperature: 97.4 F Heart Rate: 106  Results/Data Urine [Data Includes: Last 1 Day]   [id number]  COLOR AMBER   APPEARANCE CLOUDY   SPECIFIC GRAVITY 1.010   pH 6.0   GLUCOSE NEG mg/dL  BILIRUBIN NEG   KETONE NEG mg/dL  BLOOD LARGE   PROTEIN 30 mg/dL  UROBILINOGEN 0.2 mg/dL  NITRITE NEG   LEUKOCYTE ESTERASE SMALL   SQUAMOUS EPITHELIAL/HPF RARE   WBC 3-6 WBC/hpf  RBC 21-50 RBC/hpf  BACTERIA FEW   CRYSTALS NONE SEEN   CASTS NONE SEEN    A KUB was performed in our clinic today to assess the stent positions. The double-J stents are noted to be well positioned in the shadow  of the renal pelvis bilaterally. The distal end of the stent appeared to be curled within the bladder laterally.   Assessment Patient with stage IIIB cervical cancer and associated bilateral hydronephrosis secondary to extrinsic compression of the  ureters. She is status post bilateral ureteral stent placement. She currently has significant pelvic pain, I'm unable to separate whether this is then discomfort or pain from her tumor/disease process. She does not describe classic stent discomfort/bladder spasms. Her stents that appear to be in good position. Her recent urine culture was negative. Further, her trospium appears to be causing urinary retention.   Plan Health Maintenance  1. UA With REFLEX; [Do Not Release]; Status:Complete;   Done: [id number] 12:37PM Hydronephrosis  2. Start: Belladonna Alkaloids-Opium 16.2-60 MG Rectal Suppository; INSERT 1 SUPP  Every 8 hours PRN bladder spasm rectal suppository 3. KUB; Status:Complete;   Done: [id number] 12:00AM  Discussion/Summary We discussed the patient's situation in detail, I told her that it would be very difficult to separate pelvic pain from stent/bladder spasms. However, it appears as if the trospium is not providing significant relief, but rather causing urinary retention. As such, I recommended that the patient discontinue this. She has started Flomax today, I think this is worth a try, I encouraged her to give it 10 days and if this is not helping either that she stopped this also.  I have prescribed for he B and O suppositories which can be obtained at customer care pharmacy. I reassured her that the stent discomfort will resolve and that she will grow accustom to her stents. However, if this pain is related to her cervical cancer mass her pain may persist. I have also advised her of her urine culture and suggested that she stop taking her ciprofloxacin. We briefly discussed nephrostomy tubes. I advised her to give this more time as the symptoms are likely to improve before making a decision in regards to placement of nephrostomy tubes. I tentatively have the patient scheduled for stent exchange in 3 months in the OR. I am happy to see the patient back sooner if needed.        Addendum: Patient presents today for bilateral ureteral stent exchange.  Risk/benefits of this operation were discussed in detail and the patient has agreed to proceed.  NAD RRR CTA-B  Double cover urine with cipro and gentamicin because we have no recent culture data.  Proceed to OR for bialteral stent exchange.

## 2013-05-12 NOTE — Anesthesia Procedure Notes (Signed)
Procedure Name: LMA Insertion Date/Time: 05/12/2013 9:44 AM Performed by: Justice Rocher Pre-anesthesia Checklist: Patient identified, Emergency Drugs available, Suction available and Patient being monitored Patient Re-evaluated:Patient Re-evaluated prior to inductionOxygen Delivery Method: Circle System Utilized Preoxygenation: Pre-oxygenation with 100% oxygen Intubation Type: IV induction Ventilation: Mask ventilation without difficulty LMA: LMA inserted LMA Size: 4.0 Number of attempts: 1 Airway Equipment and Method: bite block Placement Confirmation: positive ETCO2 Tube secured with: Tape Dental Injury: Teeth and Oropharynx as per pre-operative assessment

## 2013-05-12 NOTE — Op Note (Addendum)
Preoperative diagnosis: Extrinsic ureteral obstruction secondary to locally advanced cervical cancer  Postoperative diagnosis:  1. As above   Procedure: 1. Cystoscopy, bilateral retrograde pyelogram with interpretation, bilateral ureteral stent exchange (6 French x26 cm double-J)  Surgeon: Ardis Hughs, MD  Anesthesia: General  Complications: None  Intraoperative findings: Stents were easily exchanged, there was no encrustation on the stents, the bladder mucosa had some edematous changes as expected with chronic stents, the mucosa appeared more normal than 3 months prior, there was less heaping of the posterior bladder floor and the mucosa was more consistent with normal bladder mucosa.  EBL: Minimal  Specimens: None  Indication: Gina Barnett is a 58 y.o. patient with locally advanced cervical cancer. The patient presented to me with bilateral hydronephrosis, and 3 months prior I placed bilateral ureteral stents. She presents today for an stent exchange. After reviewing the management options for treatment, she elected to proceed with the above surgical procedure(s). We have discussed the potential benefits and risks of the procedure, side effects of the proposed treatment, the likelihood of the patient achieving the goals of the procedure, and any potential problems that might occur during the procedure or recuperation. Informed consent has been obtained.  Description of procedure:  The patient was taken to the operating room and general anesthesia was induced.  The patient was placed in the dorsal lithotomy position, prepped and draped in the usual sterile fashion, and preoperative antibiotics were administered. A preoperative time-out was performed.   The 41 French 30 cystoscope was then gently passed to the patient's urethra and the bladder. A care 60 cystoscopic evaluation was performed with the above findings. The right stent was then grabbed at the distal tip with a stent  grasper and pulled to the urethral meatus. A 0.38 sensor wire was then passed through the tip of the stent into the right renal pelvis. Stent was then removed over the wire. A 5 French ureteral Sallyanne Havers was then passed over the wire and into the right renal pelvis a retrograde pyelogram was performed. The wire was then passed back through the [place] catheter the [place] catheter removed. The cystoscope was then replaced into the patient's bladder and the left ureteral stent was grasped at the distal tip and pulled to the urethral meatus. A second 0.38 wire was in gently passed through the stent and into the left renal pelvis. Stent was then removed over the wire and a 5 French open-ended ureteral Pollock catheter was then passed over the stent and into the left renal pelvis. Retrograde pyelogram was then performed with the above findings. The Pollack catheter was then exchanged for the wire.  This point the right ureteral stent was passed over the wire that was previously placed in the right renal pelvis and advanced under fluoroscopic guidance into the right renal pelvis. The distal tip of the stent was then pushed to the urethral meatus and the stent was then deployed by pulling the wire a nice curl noted in the renal pelvis as well as the bladder on fluoroscopy. The left stent was placed in a similar manner. The bladder was then emptied. A  B. &O. suppository was in place the patient's rectum. The patient subsequently awoken and returned back in excellent condition.  Ardis Hughs, M.D.   Cc: Dr. Evlyn Clines, MD

## 2013-05-12 NOTE — Anesthesia Preprocedure Evaluation (Signed)
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    Pulmonary neg pulmonary ROS,  breath sounds clear to auscultation  Pulmonary exam normal       Cardiovascular negative cardio ROS  Rhythm:Regular Rate:Normal     Neuro/Psych negative neurological ROS  negative psych ROS   GI/Hepatic negative GI ROS, Neg liver ROS,   Endo/Other  negative endocrine ROS  Renal/GU bilateral malignant external ureter obstruction  negative genitourinary   Musculoskeletal negative musculoskeletal ROS (+)   Abdominal   Peds negative pediatric ROS (+)  Hematology negative hematology ROS (+)   Anesthesia Other Findings   Reproductive/Obstetrics negative OB ROS                           Anesthesia Physical Anesthesia Plan  ASA: III  Anesthesia Plan: General   Post-op Pain Management:    Induction: Intravenous  Airway Management Planned: LMA  Additional Equipment:   Intra-op Plan:   Post-operative Plan:   Informed Consent: I have reviewed the patients History and Physical, chart, labs and discussed the procedure including the risks, benefits and alternatives for the proposed anesthesia with the patient or authorized representative who has indicated his/her understanding and acceptance.   Dental advisory given  Plan Discussed with: CRNA and Surgeon  Anesthesia Plan Comments:         Anesthesia Quick Evaluation

## 2013-05-12 NOTE — Transfer of Care (Signed)
Immediate Anesthesia Transfer of Care Note  Patient: Gina Barnett  Procedure(s) Performed: Procedure(s) (LRB): BILATERAL STENT EXCHANGE/BILATERAL RETROGRADE PYELOGRAM (Bilateral)  Patient Location: PACU  Anesthesia Type: General  Level of Consciousness: awake, sedated, patient cooperative and responds to stimulation  Airway & Oxygen Therapy: Patient Spontanous Breathing and Patient connected to face mask oxygen  Post-op Assessment: Report given to PACU RN, Post -op Vital signs reviewed and stable and Patient moving all extremities  Post vital signs: Reviewed and stable  Complications: No apparent anesthesia complications

## 2013-05-15 ENCOUNTER — Encounter (HOSPITAL_BASED_OUTPATIENT_CLINIC_OR_DEPARTMENT_OTHER): Payer: Self-pay | Admitting: Urology

## 2013-05-18 ENCOUNTER — Telehealth: Payer: Self-pay

## 2013-05-18 DIAGNOSIS — C539 Malignant neoplasm of cervix uteri, unspecified: Secondary | ICD-10-CM

## 2013-05-18 MED ORDER — FENTANYL 75 MCG/HR TD PT72: 75.0000 ug | MEDICATED_PATCH | TRANSDERMAL | Status: DC

## 2013-05-18 NOTE — Telephone Encounter (Signed)
Gina Barnett used her last Duragesic 75 mcg patch today and would like a refill. She had her bilateral ureteral stents replaced Friday May 12, 2013. Sh has an appointment with Dr. Ladona Horns at [facility] tomorrow 05-19-13 at 2 pm.

## 2013-05-19 NOTE — Telephone Encounter (Signed)
Told Gina Barnett that a prescription for the Duragesic patches is available for pick up with the injection nurse.  She will pick up prescription on Monday 05-22-13.

## 2013-05-22 ENCOUNTER — Other Ambulatory Visit: Payer: Self-pay | Admitting: Radiation Oncology

## 2013-05-22 DIAGNOSIS — C539 Malignant neoplasm of cervix uteri, unspecified: Secondary | ICD-10-CM

## 2013-05-25 ENCOUNTER — Telehealth: Payer: Self-pay | Admitting: *Deleted

## 2013-05-25 NOTE — Telephone Encounter (Signed)
CALLED PATIENT TO INFORM OF PET SCAN FOR 06-02-13, SPOKE WITH PATIENT AND SHE IS AWARE OF THIS TEST

## 2013-05-26 ENCOUNTER — Ambulatory Visit: Payer: BC Managed Care – PPO | Attending: Gynecology | Admitting: Gynecology

## 2013-05-26 ENCOUNTER — Encounter: Payer: Self-pay | Admitting: Gynecology

## 2013-05-26 VITALS — BP 123/76 | HR 71 | Temp 97.9°F | Resp 18 | Ht 67.0 in | Wt 169.8 lb

## 2013-05-26 DIAGNOSIS — J45909 Unspecified asthma, uncomplicated: Secondary | ICD-10-CM | POA: Insufficient documentation

## 2013-05-26 DIAGNOSIS — Z9221 Personal history of antineoplastic chemotherapy: Secondary | ICD-10-CM | POA: Insufficient documentation

## 2013-05-26 DIAGNOSIS — Z79899 Other long term (current) drug therapy: Secondary | ICD-10-CM | POA: Insufficient documentation

## 2013-05-26 DIAGNOSIS — C539 Malignant neoplasm of cervix uteri, unspecified: Secondary | ICD-10-CM

## 2013-05-26 DIAGNOSIS — Z923 Personal history of irradiation: Secondary | ICD-10-CM | POA: Insufficient documentation

## 2013-05-26 DIAGNOSIS — N1330 Unspecified hydronephrosis: Secondary | ICD-10-CM | POA: Insufficient documentation

## 2013-05-26 NOTE — Patient Instructions (Signed)
Return to see me on May 29.

## 2013-05-26 NOTE — Progress Notes (Signed)
Consult Note: Gyn-Onc   Gina Barnett 58 y.o. female  Chief Complaint  Patient presents with  . 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 Bilateral hydronephrosis. Partial clinical response to radiation therapy.  Plan:  Prior making any further recommendations regarding treatment planning, I would like to see the results of the CAT/PET scan scheduled to be performed on May 22. I will plan on seeing the patient back on the 29th. I did raise the possibility that she might benefit from some additional radiation therapy to the lower vagina (where there appears to be clinical disease). I also raiseD the possibility of reinstituting chemotherapy (probably carboplatin and Taxol).  Interval history:.  Since her initial consultation with the patient she is now received whole pelvis radiation therapy with extensive field under the direction Dr. Gery Pray.   Intrauterine brachytherapy was considered and the patient was evaluated at [facility]. However no cervical os could be identified and it was felt that brachytherapy was not a reasonable option.  The patient had a CT scan last week which shows considerable resolution of adenopathy as well as considerable shrinkage of the central pelvic mass (cervical cancer) however she does have persistent hydronephrosis and has since in place. The patient still feels considerably better than prior to initiating therapy although she does have some persistent small amount of vaginal bleeding especially after examination. She notes that her colonic function is much better. She denies any significant pain or any bladder symptoms.  HPI: 58 year old white single female initially seen in consultation at the request of Dr.Lavoie regarding management of a newly diagnosed squamous cell carcinoma cervix.  The patient reports she has never had a Pap smear. She initially presented with heavy vaginal bleeding. Examination  revealed an obvious malignancy and biopsies have been obtained from the upper vagina and lower anterior vagina both of which showed invasive moderately differentiated squamous cell carcinoma. Subsequently, CT scan was obtained showing significant findings including a large central pelvic mass, pelvic and periaortic adenopathy, moderate left hydronephrosis. There is a small lesion in the right lobe of the liver as well as a 4 mm lesion in the right lower lobe lung.  Patient denies any flank pain or any fever or chills. For the last 4 days the patient's had considerable pain in her right hip assisting the use of crutches. She believes this is secondary to sleeping in a recliner in the hospital room all she's been accompaning her roommate who was hospitalized currently.  The patient initially treated with extended field radiation therapy and concurrent cisplatin use as a radiation sensitizing. However she tolerated cisplatin poorly knowing received 2 doses during her radiation therapy.  Review of Systems:10 point review of systems is negative except as noted in interval history.   Vitals: Blood pressure 123/76, pulse 71, temperature 97.9 F (36.6 C), temperature source Oral, resp. rate 18, height 5\' 7"  (1.702 m), weight 169 lb 12.8 oz (77.021 kg), last menstrual period 03/11/2013.  Physical Exam: General : The patient is a healthy woman in no acute distress.  HEENT: normocephalic, extraoccular movements normal; neck is supple without thyromegally  Lynphnodes: Supraclavicular and inguinal nodes not enlarged  Abdomen: Soft, non-tender, no ascites, no organomegally, no masses, no hernias  Pelvic:  EGBUS: Normal female  Vagina: The anterior vaginal wall is a nodular lesion approximately halfway down the vagina. (Is feels to be viable tumor) Urethra and Bladder: Normal, non-tender  Cervix:  The upper vagina is agglutinated am unable to visualize the cervix. There is some blood noted after inserting a  speculum. Uterus: Difficult to outline secondary to very large pelvic mass Bi-manual examination: Non-tender; believe the tumor extends into the parametria but not to the sidewall as previously had been noted. Rectal: normal sphincter tone, no masses, no blood  Lower extremities: No edema or varicosities.        Allergies  Allergen Reactions  . Latex Itching and Swelling    Hand swelling  . Penicillins Hives and Swelling    Past Medical History  Diagnosis Date  . Hemorrhoids   . Elevated CEA   . Retroperitoneal lymphadenopathy   . Iron deficiency anemia   . Environmental allergies   . Low back pain   . Asthma   . Chronic constipation   . Eczema   . History of radiation therapy 02/13/2013-03/17/2013    pelvis and periaortic area 45 gray  . Extrinsic ureteral obstruction     bilateral malignant external ureter obstruction  . 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 History of dysfunctional uterine bleeding     stopped bleeding  in approx.  feb  2015    Past Surgical History  Procedure Laterality Date  . Colposcopy    . Tonsillectomy  1960's  . Cystoscopy w/ ureteral stent placement Bilateral 02/16/2013    Procedure: CYSTOSCOPY WITH BILATERAL  RETROGRADE PYELOGRAM/BILATERAL  STENT PLACEMENT;  Surgeon: Ardis Hughs, MD;  [facility]: [facility];  Service: Urology;  Laterality: Bilateral;  . Cystoscopy with retrograde pyelogram, ureteroscopy and stent placement Bilateral 05/12/2013    Procedure: BILATERAL STENT EXCHANGE/BILATERAL RETROGRADE PYELOGRAM;  Surgeon: Ardis Hughs, MD;  Location: [facility];  Service: Urology;  Laterality: Bilateral;    Current Outpatient Prescriptions  Medication Sig Dispense Refill  . albuterol (PROVENTIL HFA;VENTOLIN HFA) 108 (90 BASE) MCG/ACT inhaler Inhale into the lungs every 6  (six) hours as needed for wheezing or shortness of breath.      . bisacodyl (DULCOLAX) 5 MG EC tablet Take 5 mg by mouth daily as needed for moderate constipation.      . clobetasol cream (TEMOVATE) 0.35 % Apply 1 application topically 2 (two) times daily as needed.       . fentaNYL (DURAGESIC - DOSED MCG/HR) 75 MCG/HR Place 1 patch (75 mcg total) onto the skin every 3 (three) days.  5 patch  0  . ibuprofen (ADVIL,MOTRIN) 200 MG tablet Take 200 mg by mouth every 6 (six) hours as needed.      . Meth-Hyo-M Bl-Na Phos-Ph Sal (URELLE PO) Take 1 tablet by mouth 4 (four) times daily as needed (prescribed by Dr Haskell Flirt 03/11/13.---   pt takes one tab daily in am).       . ondansetron (ZOFRAN) 8 MG tablet Take 1-2 tablets (8-16 mg total) by mouth every 12 (twelve) hours as needed for nausea or vomiting.  30 tablet  1  . triamcinolone cream (KENALOG) 0.1 % Apply 1 application topically as needed.      . Cholecalciferol (VITAMIN D3) 5000 UNITS CAPS Take 1 capsule by mouth daily. Take Monday thru Friday      . folic acid (FOLVITE) 1 MG tablet Take 1 mg by mouth daily.      Marland Kitchen HYDROmorphone (DILAUDID) 4 MG tablet Take one  1-2  tablet by mouth every 4 to 6 hours as needed for breakthrough pain  10 tablet  0  . LORazepam (ATIVAN) 1 MG tablet Take 1 tablet under tongue or swallow every 6 hours as needed for nausea. Will make drowsy.  30 tablet  0  . magnesium hydroxide (MILK OF MAGNESIA) 400 MG/5ML suspension Take 30 mLs by mouth daily as needed for mild constipation.      . polyethylene glycol (MIRALAX / GLYCOLAX) packet Take 17 g by mouth daily.      . tamsulosin (FLOMAX) 0.4 MG CAPS capsule Take 0.4 mg by mouth daily.       . vitamin B-12 (CYANOCOBALAMIN) 1000 MCG tablet Take 1,000 mcg by mouth daily.       No current facility-administered medications for this visit.    History   Social History  . Marital Status: Single    Spouse Name: N/A    Number of Children: 0  . Years of Education: N/A    Occupational History  . physical therapist    Social History Main Topics  . Smoking status: Never Smoker   . Smokeless tobacco: Never Used  . Alcohol Use: No  . Drug Use: No  . Sexual Activity: Not on file   Other Topics Concern  . Not on file   Social History Narrative  . No narrative on file    Family History  Problem Relation Age of Onset  . Heart disease Mother   . Heart disease Father   . Hypertension Father   . Leukemia Paternal [place], MD 05/26/2013, 2:40 PM

## 2013-06-01 ENCOUNTER — Other Ambulatory Visit: Payer: Self-pay | Admitting: *Deleted

## 2013-06-01 DIAGNOSIS — C539 Malignant neoplasm of cervix uteri, unspecified: Secondary | ICD-10-CM

## 2013-06-01 MED ORDER — FENTANYL 75 MCG/HR TD PT72: 75.0000 ug | MEDICATED_PATCH | TRANSDERMAL | Status: DC

## 2013-06-02 ENCOUNTER — Ambulatory Visit (HOSPITAL_COMMUNITY)
Admission: RE | Admit: 2013-06-02 | Discharge: 2013-06-02 | Disposition: A | Discharge status: Home / Self Care | Payer: BC Managed Care – PPO | Source: Ambulatory Visit | Attending: Radiation Oncology | Admitting: Radiation Oncology

## 2013-06-02 ENCOUNTER — Encounter (HOSPITAL_COMMUNITY): Payer: Self-pay

## 2013-06-02 DIAGNOSIS — C539 Malignant neoplasm of cervix uteri, unspecified: Secondary | ICD-10-CM

## 2013-06-02 LAB — GLUCOSE, CAPILLARY: Glucose-Capillary: 93 mg/dL (ref 70–99)

## 2013-06-02 MED ORDER — FLUDEOXYGLUCOSE F - 18 (FDG) INJECTION
8.8000 | Freq: Once | INTRAVENOUS | Status: AC | PRN
  Administered 2013-06-02: 8.8 via INTRAVENOUS

## 2013-06-08 ENCOUNTER — Ambulatory Visit: Payer: BC Managed Care – PPO | Admitting: Radiation Oncology

## 2013-06-09 ENCOUNTER — Encounter: Payer: Self-pay | Admitting: Gynecology

## 2013-06-09 ENCOUNTER — Other Ambulatory Visit (HOSPITAL_COMMUNITY)
Admission: RE | Admit: 2013-06-09 | Discharge: 2013-06-09 | Disposition: A | Discharge status: Home / Self Care | Payer: BC Managed Care – PPO | Source: Ambulatory Visit | Attending: Gynecology | Admitting: Gynecology

## 2013-06-09 ENCOUNTER — Ambulatory Visit: Payer: BC Managed Care – PPO | Attending: Gynecology | Admitting: Gynecology

## 2013-06-09 VITALS — BP 123/76 | HR 89 | Temp 97.4°F | Resp 18 | Ht 67.0 in | Wt 165.2 lb

## 2013-06-09 DIAGNOSIS — R970 Elevated carcinoembryonic antigen [CEA]: Secondary | ICD-10-CM | POA: Insufficient documentation

## 2013-06-09 DIAGNOSIS — N1330 Unspecified hydronephrosis: Secondary | ICD-10-CM | POA: Insufficient documentation

## 2013-06-09 DIAGNOSIS — J45909 Unspecified asthma, uncomplicated: Secondary | ICD-10-CM | POA: Insufficient documentation

## 2013-06-09 DIAGNOSIS — C539 Malignant neoplasm of cervix uteri, unspecified: Secondary | ICD-10-CM

## 2013-06-09 DIAGNOSIS — Z79899 Other long term (current) drug therapy: Secondary | ICD-10-CM | POA: Insufficient documentation

## 2013-06-09 DIAGNOSIS — Z01419 Encounter for gynecological examination (general) (routine) without abnormal findings: Secondary | ICD-10-CM | POA: Insufficient documentation

## 2013-06-09 NOTE — Patient Instructions (Addendum)
You will have surgery at the [facility] on June 5 with Dr. Fermin Schwab.                Preparing for your Surgery  Pre-operative Testing -You will receive a phone call from presurgical testing at [facility] to arrange for a pre-operative testing appointment before your surgery.  This appointment normally occurs one to two weeks before your scheduled surgery.   -Bring your insurance card, copy of an advanced directive if applicable, medication list  -At that visit, you will be asked to sign a consent for a possible blood transfusion in case a transfusion becomes necessary during surgery.  The need for a blood transfusion is rare but having consent is a necessary part of your care.     Day Before Surgery at [facility] will be asked to take in only clear liquids the day before surgery.  Examples of clear liquids include broths, jello, and clear juices.   You will be advised to have nothing to eat or drink after midnight the evening before.    Your role in recovery Your role is to become active as soon as directed by your doctor, while still giving yourself time to heal.  Rest when you feel tired. You will be asked to do the following in order to speed your recovery:  - Cough and breathe deeply. This helps toclear and expand your lungs and can prevent pneumonia. You may be given a spirometer to practice deep breathing. A staff member will show you how to use the spirometer. - Do mild physical activity. Walking or moving your legs help your circulation and body functions return to normal. A staff member will help you when you try to walk and will provide you with simple exercises. Do not try to get up or walk alone the first time. - Actively manage your pain. Managing your pain lets you move in comfort. We will ask you to rate your pain on a scale of zero to 10. It is your responsibility to tell your doctor or nurse where and how much you hurt so your pain can be  treated.  Special Considerations -If you are diabetic, you may be placed on insulin after surgery to have closer control over your blood sugars to promote healing and recovery.  This does not mean that you will be discharged on insulin.  If applicable, your oral antidiabetics will be resumed when you are tolerating a solid diet.  -Your final pathology results from surgery should be available by the Friday after surgery and the results will be relayed to you when available.

## 2013-06-09 NOTE — Progress Notes (Signed)
Consult Note: Gyn-Onc   Gina Barnett 58 y.o. female  Chief Complaint  Patient presents with  . 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 Bilateral hydronephrosis. Partial clinical response to radiation therapy.  Plan:  I recommend examination under anesthesia with additional vaginal biopsies and clitoral biopsy as I am concerned the patient has persistent disease in the anterior vagina and likely involvement of the clitoris. If persistent diseases documented we'll reconsult Dr. Kinard for further consideration of radiation therapy (possibly a vaginal cylinder)  Interval history:. Patient returns today as previously scheduled. She had a PET scan on May 22 (a week after her last visit with me) that shows significant improvement of the pelvic and aortic lymph nodes. It seems that there is persistent hypermetabolic activity in the vagina as well as a lesion on the vulva. The patient reports she is having some right pelvic pain as well as some scant vaginal bleeding on a daily basis.  HPI: 58-year-old white single female initially seen in consultation at the request of Dr.Lavoie regarding management of a newly diagnosed squamous cell carcinoma cervix.  The patient reports she has never had a Pap smear. She initially presented with heavy vaginal bleeding. Examination revealed an obvious malignancy and biopsies have been obtained from the upper vagina and lower anterior vagina both of which showed invasive moderately differentiated squamous cell carcinoma. Subsequently, CT scan was obtained showing significant findings including a large central pelvic mass, pelvic and periaortic adenopathy, moderate left hydronephrosis. There is a small lesion in the right lobe of the liver as well as a 4 mm lesion in the right lower lobe lung.  Patient denies any flank pain or any fever or chills.  The patient initially treated with extended  field radiation therapy and concurrent cisplatin use as a radiation sensitizing. However she tolerated cisplatin poorly knowing received 2 doses during her radiation therapy. Evaluation for application of brachytherapy indicated that anatomy was not sufficient to allow brachytherapy  A PET scan on 06/02/2013 showed resolution of activity in the pelvic and periaortic lymph nodes although apparent persistent disease and the vagina and vulva.  Review of Systems:10 point review of systems is negative except as noted in interval history.   Vitals: Blood pressure 123/76, pulse 89, temperature 97.4 F (36.3 C), temperature source Oral, resp. rate 18, height 5' 7" (1.702 m), weight 165 lb 3.2 oz (74.934 kg), last menstrual period 03/11/2013.  Physical Exam: General : The patient is a healthy woman in no acute distress.  HEENT: normocephalic, extraoccular movements normal; neck is supple without thyromegally  Lynphnodes: Supraclavicular and inguinal nodes not enlarged  Abdomen: Soft, non-tender, no ascites, no organomegally, no masses, no hernias  Pelvic:  EGBUS: Normal female the clitoris appears to be inflamed and enlarged and tender. Just above the clitoris and subcutaneous tissue is a separate nodule. Vagina: The anterior vaginal wall is a nodular lesion approximately halfway down the vagina which is friable and appears to be viable tumor. The patient is too uncomfortable to allow biopsy today. Urethra and Bladder: Normal, non-tender  Cervix: The upper vagina is agglutinated am unable to visualize the cervix. There is some blood noted after inserting a speculum. Uterus: Difficult to outline secondary to very large pelvic mass Bi-manual examination: Non-tender; believe the tumor extends into the parametria but not to the sidewall as previously had been noted. Rectal: normal sphincter tone, no masses, no blood    Lower extremities: No edema or varicosities.        Allergies  Allergen Reactions  .  Latex Itching and Swelling    Hand swelling  . Penicillins Hives and Swelling    Past Medical History  Diagnosis Date  . Hemorrhoids   . Elevated CEA   . Retroperitoneal lymphadenopathy   . Iron deficiency anemia   . Environmental allergies   . Low back pain   . Asthma   . Chronic constipation   . Eczema   . History of radiation therapy 02/13/2013-03/17/2013    pelvis and periaortic area 45 gray  . Extrinsic ureteral obstruction     bilateral malignant external ureter obstruction  . 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 History of dysfunctional uterine bleeding     stopped bleeding  in approx.  feb  2015    Past Surgical History  Procedure Laterality Date  . Colposcopy    . Tonsillectomy  1960's  . Cystoscopy w/ ureteral stent placement Bilateral 02/16/2013    Procedure: CYSTOSCOPY WITH BILATERAL  RETROGRADE PYELOGRAM/BILATERAL  STENT PLACEMENT;  Surgeon: Benjamin W Herrick, MD;  Location: [facility];  Service: Urology;  Laterality: Bilateral;  . Cystoscopy with retrograde pyelogram, ureteroscopy and stent placement Bilateral 05/12/2013    Procedure: BILATERAL STENT EXCHANGE/BILATERAL RETROGRADE PYELOGRAM;  Surgeon: Benjamin W Herrick, MD;  Location:  SUR[facility]ERY [facility];  Service: Urology;  Laterality: Bilateral;    Current Outpatient Prescriptions  Medication Sig Dispense Refill  . albuterol (PROVENTIL HFA;VENTOLIN HFA) 108 (90 BASE) MCG/ACT inhaler Inhale into the lungs every 6 (six) hours as needed for wheezing or shortness of breath.      . bisacodyl (DULCOLAX) 5 MG EC tablet Take 5 mg by mouth daily as needed for moderate constipation.      . Cholecalciferol (VITAMIN D3) 5000 UNITS CAPS Take 1 capsule by mouth daily. Take Monday thru Friday      . clobetasol cream (TEMOVATE) 0.05 % Apply 1 application topically 2 (two) times  daily as needed.       . fentaNYL (DURAGESIC - DOSED MCG/HR) 75 MCG/HR Place 1 patch (75 mcg total) onto the skin every 3 (three) days.  10 patch  0  . folic acid (FOLVITE) 1 MG tablet Take 1 mg by mouth daily.      . HYDROmorphone (DILAUDID) 4 MG tablet Take one 1-2  tablet by mouth every 4 to 6 hours as needed for breakthrough pain  10 tablet  0  . ibuprofen (ADVIL,MOTRIN) 200 MG tablet Take 200 mg by mouth every 6 (six) hours as needed.      . LORazepam (ATIVAN) 1 MG tablet Take 1 tablet under tongue or swallow every 6 hours as needed for nausea. Will make drowsy.  30 tablet  0  . magnesium hydroxide (MILK OF MAGNESIA) 400 MG/5ML suspension Take 30 mLs by mouth daily as needed for mild constipation.      . Meth-Hyo-M Bl-Na Phos-Ph Sal (URELLE PO) Take 1 tablet by mouth 4 (four) times daily as needed (prescribed by Dr James Labranche 03/11/13.---   pt takes one tab daily in am).       . ondansetron (ZOFRAN) 8 MG tablet Take 1-2 tablets (8-16 mg total) by mouth every 12 (twelve) hours as needed for nausea or vomiting.  30 tablet  1  .   polyethylene glycol (MIRALAX / GLYCOLAX) packet Take 17 g by mouth daily.      . triamcinolone cream (KENALOG) 0.1 % Apply 1 application topically as needed.      . vitamin B-12 (CYANOCOBALAMIN) 1000 MCG tablet Take 1,000 mcg by mouth daily.      . tamsulosin (FLOMAX) 0.4 MG CAPS capsule Take 0.4 mg by mouth daily.        No current facility-administered medications for this visit.    History   Social History  . Marital Status: Single    Spouse Name: N/A    Number of Children: 0  . Years of Education: N/A   Occupational History  . physical therapist    Social History Main Topics  . Smoking status: Never Smoker   . Smokeless tobacco: Never Used  . Alcohol Use: No  . Drug Use: No  . Sexual Activity: Not on file   Other Topics Concern  . Not on file   Social History Narrative  . No narrative on file    Family History  Problem Relation Age of  Onset  . Heart disease Mother   . Heart disease Father   . Hypertension Father   . Leukemia Paternal Grandfather       Mackenzie Groom L Clarke[place], MD 06/09/2013, 12:14 PM        

## 2013-06-12 ENCOUNTER — Encounter (HOSPITAL_BASED_OUTPATIENT_CLINIC_OR_DEPARTMENT_OTHER): Payer: Self-pay | Admitting: *Deleted

## 2013-06-12 NOTE — Progress Notes (Signed)
NPO AFTER MN. ARRIVE AT 0600.  PRE-OP ORDERS PENDING, OFFICE NOTIFIED.  NEEDS HG.

## 2013-06-13 ENCOUNTER — Telehealth: Payer: Self-pay | Admitting: Oncology

## 2013-06-13 NOTE — Telephone Encounter (Signed)
Gina Barnett called asking if she needs to come for her follow up appointment on Thursday.  She said she is scheduled for a biopsy on Friday with Dr. Fermin Schwab.  She also said that Dr. Fermin Schwab wanted to speak to Dr. Sondra Come.

## 2013-06-15 ENCOUNTER — Ambulatory Visit: Payer: BC Managed Care – PPO | Admitting: Radiation Oncology

## 2013-06-16 ENCOUNTER — Ambulatory Visit (HOSPITAL_COMMUNITY): Payer: BC Managed Care – PPO

## 2013-06-16 ENCOUNTER — Ambulatory Visit (HOSPITAL_BASED_OUTPATIENT_CLINIC_OR_DEPARTMENT_OTHER): Payer: BC Managed Care – PPO | Admitting: Anesthesiology

## 2013-06-16 ENCOUNTER — Encounter (HOSPITAL_BASED_OUTPATIENT_CLINIC_OR_DEPARTMENT_OTHER): Payer: BC Managed Care – PPO | Admitting: Anesthesiology

## 2013-06-16 ENCOUNTER — Encounter (HOSPITAL_BASED_OUTPATIENT_CLINIC_OR_DEPARTMENT_OTHER)
Admission: RE | Disposition: A | Discharge status: Home / Self Care | Payer: Self-pay | Source: Ambulatory Visit | Attending: Gynecology

## 2013-06-16 ENCOUNTER — Encounter (HOSPITAL_BASED_OUTPATIENT_CLINIC_OR_DEPARTMENT_OTHER): Payer: Self-pay | Admitting: *Deleted

## 2013-06-16 ENCOUNTER — Ambulatory Visit (HOSPITAL_BASED_OUTPATIENT_CLINIC_OR_DEPARTMENT_OTHER)
Admission: RE | Admit: 2013-06-16 | Discharge: 2013-06-16 | Disposition: A | Discharge status: Home / Self Care | Payer: BC Managed Care – PPO | Source: Ambulatory Visit | Attending: Gynecology | Admitting: Gynecology

## 2013-06-16 DIAGNOSIS — C7982 Secondary malignant neoplasm of genital organs: Secondary | ICD-10-CM | POA: Insufficient documentation

## 2013-06-16 DIAGNOSIS — C539 Malignant neoplasm of cervix uteri, unspecified: Secondary | ICD-10-CM

## 2013-06-16 DIAGNOSIS — J45909 Unspecified asthma, uncomplicated: Secondary | ICD-10-CM | POA: Insufficient documentation

## 2013-06-16 DIAGNOSIS — L259 Unspecified contact dermatitis, unspecified cause: Secondary | ICD-10-CM | POA: Insufficient documentation

## 2013-06-16 DIAGNOSIS — N1330 Unspecified hydronephrosis: Secondary | ICD-10-CM | POA: Insufficient documentation

## 2013-06-16 DIAGNOSIS — Z923 Personal history of irradiation: Secondary | ICD-10-CM | POA: Insufficient documentation

## 2013-06-16 DIAGNOSIS — Z79899 Other long term (current) drug therapy: Secondary | ICD-10-CM | POA: Insufficient documentation

## 2013-06-16 DIAGNOSIS — Z91040 Latex allergy status: Secondary | ICD-10-CM | POA: Insufficient documentation

## 2013-06-16 DIAGNOSIS — K5900 Constipation, unspecified: Secondary | ICD-10-CM | POA: Insufficient documentation

## 2013-06-16 DIAGNOSIS — Z880 Allergy status to penicillin: Secondary | ICD-10-CM | POA: Insufficient documentation

## 2013-06-16 HISTORY — PX: VULVA /PERINEUM BIOPSY: SHX319

## 2013-06-16 HISTORY — PX: EXAMINATION UNDER ANESTHESIA: SHX1540

## 2013-06-16 LAB — POCT HEMOGLOBIN-HEMACUE: Hemoglobin: 11.5 g/dL — ABNORMAL LOW (ref 12.0–15.0)

## 2013-06-16 SURGERY — EXAM UNDER ANESTHESIA
Anesthesia: General | Site: Vagina

## 2013-06-16 MED ORDER — ONDANSETRON HCL 4 MG/2ML IJ SOLN
INTRAMUSCULAR | Status: DC | PRN
  Administered 2013-06-16: 4 mg via INTRAVENOUS

## 2013-06-16 MED ORDER — FENTANYL CITRATE 0.05 MG/ML IJ SOLN
INTRAMUSCULAR | Status: AC
  Filled 2013-06-16: qty 4

## 2013-06-16 MED ORDER — MIDAZOLAM HCL 2 MG/2ML IJ SOLN
INTRAMUSCULAR | Status: AC
  Filled 2013-06-16: qty 2

## 2013-06-16 MED ORDER — FENTANYL CITRATE 0.05 MG/ML IJ SOLN
INTRAMUSCULAR | Status: DC | PRN
  Administered 2013-06-16: 100 ug via INTRAVENOUS

## 2013-06-16 MED ORDER — OXYCODONE-ACETAMINOPHEN 5-325 MG PO TABS
1.0000 | ORAL_TABLET | ORAL | Status: DC | PRN
  Administered 2013-06-16: 1 via ORAL
  Filled 2013-06-16: qty 2

## 2013-06-16 MED ORDER — PROMETHAZINE HCL 25 MG/ML IJ SOLN
6.2500 mg | INTRAMUSCULAR | Status: DC | PRN
  Filled 2013-06-16: qty 1

## 2013-06-16 MED ORDER — FENTANYL CITRATE 0.05 MG/ML IJ SOLN
25.0000 ug | INTRAMUSCULAR | Status: DC | PRN
  Administered 2013-06-16: 50 ug via INTRAVENOUS
  Filled 2013-06-16: qty 1

## 2013-06-16 MED ORDER — PROPOFOL 10 MG/ML IV BOLUS
INTRAVENOUS | Status: DC | PRN
  Administered 2013-06-16: 50 mg via INTRAVENOUS
  Administered 2013-06-16: 200 mg via INTRAVENOUS

## 2013-06-16 MED ORDER — FENTANYL CITRATE 0.05 MG/ML IJ SOLN
INTRAMUSCULAR | Status: AC
  Filled 2013-06-16: qty 2

## 2013-06-16 MED ORDER — MIDAZOLAM HCL 5 MG/5ML IJ SOLN
INTRAMUSCULAR | Status: DC | PRN
  Administered 2013-06-16: 2 mg via INTRAVENOUS

## 2013-06-16 MED ORDER — LACTATED RINGERS IV SOLN
INTRAVENOUS | Status: DC
  Administered 2013-06-16 (×2): via INTRAVENOUS
  Filled 2013-06-16: qty 1000

## 2013-06-16 MED ORDER — OXYCODONE-ACETAMINOPHEN 5-325 MG PO TABS
ORAL_TABLET | ORAL | Status: AC
  Filled 2013-06-16: qty 1

## 2013-06-16 MED ORDER — KCL IN DEXTROSE-NACL 20-5-0.45 MEQ/L-%-% IV SOLN
INTRAVENOUS | Status: DC
  Filled 2013-06-16: qty 1000

## 2013-06-16 MED ORDER — MEPERIDINE HCL 25 MG/ML IJ SOLN
6.2500 mg | INTRAMUSCULAR | Status: DC | PRN
  Filled 2013-06-16: qty 1

## 2013-06-16 MED ORDER — DEXAMETHASONE SODIUM PHOSPHATE 4 MG/ML IJ SOLN
INTRAMUSCULAR | Status: DC | PRN
  Administered 2013-06-16: 10 mg via INTRAVENOUS

## 2013-06-16 MED ORDER — LACTATED RINGERS IV SOLN
INTRAVENOUS | Status: DC
  Filled 2013-06-16: qty 1000

## 2013-06-16 MED ORDER — LIDOCAINE HCL (CARDIAC) 20 MG/ML IV SOLN
INTRAVENOUS | Status: DC | PRN
  Administered 2013-06-16: 60 mg via INTRAVENOUS

## 2013-06-16 SURGICAL SUPPLY — 27 items
CANISTER SUCTION 2500CC (MISCELLANEOUS) IMPLANT
CATH ROBINSON RED A/P 16FR (CATHETERS) IMPLANT
CLOTH BEACON ORANGE TIMEOUT ST (SAFETY) ×3 IMPLANT
COVER TABLE BACK 60X90 (DRAPES) IMPLANT
DRAPE LG THREE QUARTER DISP (DRAPES) IMPLANT
DRESSING TELFA 8X3 (GAUZE/BANDAGES/DRESSINGS) ×3 IMPLANT
ELECT REM PT RETURN 9FT ADLT (ELECTROSURGICAL)
ELECTRODE REM PT RTRN 9FT ADLT (ELECTROSURGICAL) IMPLANT
GLOVE BIOGEL M STRL SZ7.5 (GLOVE) IMPLANT
GLOVE SKINSENSE NS SZ6.5 (GLOVE) ×2
GLOVE SKINSENSE NS SZ7.5 (GLOVE) ×4
GLOVE SKINSENSE STRL SZ6.5 (GLOVE) ×1 IMPLANT
GLOVE SKINSENSE STRL SZ7.5 (GLOVE) ×2 IMPLANT
GOWN STRL REIN XL XLG (GOWN DISPOSABLE) ×3 IMPLANT
GOWN W/COTTON TOWEL STD LRG (GOWNS) ×3 IMPLANT
GOWN XL W/COTTON TOWEL STD (GOWNS) ×3 IMPLANT
LEGGING LITHOTOMY PAIR STRL (DRAPES) IMPLANT
NEEDLE HYPO 22GX1.5 SAFETY (NEEDLE) ×3 IMPLANT
NEEDLE SPNL 22GX3.5 QUINCKE BK (NEEDLE) IMPLANT
PACK BASIN DAY SURGERY FS (CUSTOM PROCEDURE TRAY) ×3 IMPLANT
PAD OB MATERNITY 4.3X12.25 (PERSONAL CARE ITEMS) ×3 IMPLANT
PAD PREP 24X48 CUFFED NSTRL (MISCELLANEOUS) ×3 IMPLANT
SUT VIC AB 2-0 CT2 27 (SUTURE) ×3 IMPLANT
SYR CONTROL 10ML LL (SYRINGE) IMPLANT
TOWEL OR 17X24 6PK STRL BLUE (TOWEL DISPOSABLE) ×6 IMPLANT
TRAY DSU PREP LF (CUSTOM PROCEDURE TRAY) ×3 IMPLANT
WATER STERILE IRR 500ML POUR (IV SOLUTION) ×3 IMPLANT

## 2013-06-16 NOTE — Anesthesia Postprocedure Evaluation (Signed)
  Anesthesia Post-op Note  Patient: Gina Barnett  Procedure(s) Performed: Procedure(s) (LRB): EXAM UNDER ANESTHESIA (N/A) VAGINAL BIOPSY,  CLITORIS BIOPSY (N/A)  Patient Location: PACU  Anesthesia Type: General  Level of Consciousness: awake and alert   Airway and Oxygen Therapy: Patient Spontanous Breathing  Post-op Pain: mild  Post-op Assessment: Post-op Vital signs reviewed, Patient's Cardiovascular Status Stable, Respiratory Function Stable, Patent Airway and No signs of Nausea or vomiting  Last Vitals:  Filed Vitals:   06/16/13 0900  BP: 141/90  Pulse: 77  Temp:   Resp: 14    Post-op Vital Signs: stable   Complications: No apparent anesthesia complications

## 2013-06-16 NOTE — Interval H&P Note (Signed)
History and Physical Interval Note:  06/16/2013 7:22 AM  Gina Barnett  has presented today for surgery, with the diagnosis of CERVICAL CANCER   The various methods of treatment have been discussed with the patient and family. After consideration of risks, benefits and other options for treatment, the patient has consented to  Procedure(s): EXAM UNDER ANESTHESIA (N/A) VAGINAL BIOPSY,  CLITORIS BIOPSY (N/A) as a surgical intervention .  The patient's history has been reviewed, patient examined, no change in status, stable for surgery.  I have reviewed the patient's chart and labs.  Questions were answered to the patient's satisfaction.     [place]

## 2013-06-16 NOTE — Discharge Instructions (Addendum)
°  Post Anesthesia Home Care Instructions  Activity: Get plenty of rest for the remainder of the day. A responsible adult should stay with you for 24 hours following the procedure.  For the next 24 hours, DO NOT: -Drive a car -Paediatric nurse -Drink alcoholic beverages -Take any medication unless instructed by your physician -Make any legal decisions or sign important papers.  Meals: Start with liquid foods such as gelatin or soup. Progress to regular foods as tolerated. Avoid greasy, spicy, heavy foods. If nausea and/or vomiting occur, drink only clear liquids until the nausea and/or vomiting subsides. Call your physician if vomiting continues.  Special Instructions/Symptoms: Your throat may feel dry or sore from the anesthesia or the breathing tube placed in your throat during surgery. If this causes discomfort, gargle with warm salt water. The discomfort should disappear within 24 hours.    Home care Instructions:   Personal hygiene:  Used sanitary napkins for vaginal drainage not tampons. Shower or tub bathe the day after your procedure. No douching until bleeding stops. Always wipe from front to back after elimination.  Activity: Do not drive or operate any equipment today. The effects of the anesthesia are still present and drowsiness may result. Rest today, not necessarily flat bed rest, just take it easy. You may resume your normal activity in one to 2 days.  Sexual activity: No intercourse for one week or as indicated by your physician  Diet: Eat a light diet as desired this evening. You may resume a regular diet tomorrow.  Return to work: One to 2 days.  General Expectations of your surgery: Vaginal bleeding should be no heavier than a normal period.. Mild cramps may continue for a couple of days.   Unexpected observations call your doctor if these occur: persistent or heavy bleeding. Severe abdominal cramping or pain. Elevation of temperature greater than 100F.  Call  for an appointment in one week.    Patient's Signature_______________________________________________________  Nurse's Signature________________________________________________________

## 2013-06-16 NOTE — Anesthesia Preprocedure Evaluation (Signed)
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    Pulmonary neg pulmonary ROS, asthma ,  breath sounds clear to auscultation  Pulmonary exam normal       Cardiovascular negative cardio ROS  Rhythm:Regular Rate:Normal     Neuro/Psych negative neurological ROS  negative psych ROS   GI/Hepatic negative GI ROS, Neg liver ROS,   Endo/Other  negative endocrine ROS  Renal/GU bilateral malignant external ureter obstruction  negative genitourinary   Musculoskeletal negative musculoskeletal ROS (+)   Abdominal   Peds negative pediatric ROS (+)  Hematology negative hematology ROS (+)   Anesthesia Other Findings   Reproductive/Obstetrics negative OB ROS                           Anesthesia Physical  Anesthesia Plan  ASA: III  Anesthesia Plan: General   Post-op Pain Management:    Induction: Intravenous  Airway Management Planned: LMA  Additional Equipment:   Intra-op Plan:   Post-operative Plan:   Informed Consent: I have reviewed the patients History and Physical, chart, labs and discussed the procedure including the risks, benefits and alternatives for the proposed anesthesia with the patient or authorized representative who has indicated his/her understanding and acceptance.   Dental advisory given  Plan Discussed with: CRNA and Surgeon  Anesthesia Plan Comments:         Anesthesia Quick Evaluation

## 2013-06-16 NOTE — Op Note (Signed)
Gina Barnett  female MEDICAL RECORD [id number] DATE OF BIRTH: 1955/05/28 PHYSICIAN: Marti Sleigh, M.D  06/16/2013   OPERATIVE REPORT  PREOPERATIVE DIAGNOSIS: Stage IIIB carcinoma of the cervix status post pelvic radiation with apparent persistent disease.  POSTOPERATIVE DIAGNOSIS: Same  PROCEDURE: Examination under anesthesia, vaginal biopsy, cervical biopsy, excision of anterior vulvar nodules.  SURGEON: Marti Sleigh, M.D ASSISTANT:  ANESTHESIA: LMA ESTIMATED BLOOD LOSS: Minimal  SURGICAL FINDINGS: Examination anesthesia revealed a 3 cm exophytic lesion in the anterior mid vagina. The upper vagina was stenotic but above that was a palpable cervix which was nodular. In addition, there were 2 nodules just superior to the clitoris on the vulva. Biopsies of all 3 areas were obtained.  PROCEDURE:   Patient was brought to the operating room and after satisfactory attainment of anesthesia was placed in lithotomy position in [place]. The vulva and vagina were prepped with Betadine and the patient was draped. Surgical timeout was taken. Bimanual and rectovaginal exam were performed with the above-noted findings. A Deaver was used to expose the anterior vaginal lesion. Representative biopsies were obtained. Hemostasis achieved with cautery. The cervix was exposed with difficulty given a stenosis in the upper vagina. Representative biopsies of the cervix were taken. Biopsy sites were inspected and found to be hemostatic.  Attention was turned to the anterior vulvar lesions. Inspection revealed that the lesions were approximately 1 cm in diameter and just superior to the clitoris. The skin was incised in an elliptical incision and then undermined to remove all gross lesions. Hemostasis achieved with cautery. Skin margins were closed with a running subcuticular suture of 2-0 Vicryl. Surgical sites were reinspected and found to be hemostatic.  The patient was awakened from  anesthesia and taken to the recovery room in satisfactory condition. Sponge needle and isthmic counts correct x2.Marti Sleigh, M.D

## 2013-06-16 NOTE — Anesthesia Procedure Notes (Signed)
Procedure Name: LMA Insertion Date/Time: 06/16/2013 7:27 AM Performed by: Maryella Shivers Pre-anesthesia Checklist: Patient identified, Emergency Drugs available, Suction available and Patient being monitored Patient Re-evaluated:Patient Re-evaluated prior to inductionOxygen Delivery Method: Circle System Utilized Preoxygenation: Pre-oxygenation with 100% oxygen Intubation Type: IV induction Ventilation: Mask ventilation without difficulty LMA: LMA inserted LMA Size: 4.0 Number of attempts: 1 Airway Equipment and Method: bite block Placement Confirmation: positive ETCO2 Tube secured with: Tape Dental Injury: Teeth and Oropharynx as per pre-operative assessment

## 2013-06-16 NOTE — Transfer of Care (Signed)
Immediate Anesthesia Transfer of Care Note  Patient: Gina Barnett  Procedure(s) Performed: Procedure(s): EXAM UNDER ANESTHESIA (N/A) VAGINAL BIOPSY,  CLITORIS BIOPSY (N/A)  Patient Location: PACU  Anesthesia Type:General  Level of Consciousness: sedated  Airway & Oxygen Therapy: Patient Spontanous Breathing and Patient connected to face mask oxygen  Post-op Assessment: Report given to PACU RN and Post -op Vital signs reviewed and stable  Post vital signs: Reviewed and stable  Complications: No apparent anesthesia complications

## 2013-06-16 NOTE — H&P (View-Only) (Signed)
Consult Note: Gyn-Onc   Gina Barnett 58 y.o. female  Chief Complaint  Patient presents with  . 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 Bilateral hydronephrosis. Partial clinical response to radiation therapy.  Plan:  I recommend examination under anesthesia with additional vaginal biopsies and clitoral biopsy as I am concerned the patient has persistent disease in the anterior vagina and likely involvement of the clitoris. If persistent diseases documented we'll reconsult Dr. Sondra Come for further consideration of radiation therapy (possibly a vaginal cylinder)  Interval history:. Patient returns today as previously scheduled. She had a PET scan on May 22 (a week after her last visit with me) that shows significant improvement of the pelvic and aortic lymph nodes. It seems that there is persistent hypermetabolic activity in the vagina as well as a lesion on the vulva. The patient reports she is having some right pelvic pain as well as some scant vaginal bleeding on a daily basis.  HPI: 58 year old white single female initially seen in consultation at the request of Dr.Lavoie regarding management of a newly diagnosed squamous cell carcinoma cervix.  The patient reports she has never had a Pap smear. She initially presented with heavy vaginal bleeding. Examination revealed an obvious malignancy and biopsies have been obtained from the upper vagina and lower anterior vagina both of which showed invasive moderately differentiated squamous cell carcinoma. Subsequently, CT scan was obtained showing significant findings including a large central pelvic mass, pelvic and periaortic adenopathy, moderate left hydronephrosis. There is a small lesion in the right lobe of the liver as well as a 4 mm lesion in the right lower lobe lung.  Patient denies any flank pain or any fever or chills.  The patient initially treated with extended  field radiation therapy and concurrent cisplatin use as a radiation sensitizing. However she tolerated cisplatin poorly knowing received 2 doses during her radiation therapy. Evaluation for application of brachytherapy indicated that anatomy was not sufficient to allow brachytherapy  A PET scan on 06/02/2013 showed resolution of activity in the pelvic and periaortic lymph nodes although apparent persistent disease and the vagina and vulva.  Review of Systems:10 point review of systems is negative except as noted in interval history.   Vitals: Blood pressure 123/76, pulse 89, temperature 97.4 F (36.3 C), temperature source Oral, resp. rate 18, height 5\' 7"  (1.702 m), weight 165 lb 3.2 oz (74.934 kg), last menstrual period 03/11/2013.  Physical Exam: General : The patient is a healthy woman in no acute distress.  HEENT: normocephalic, extraoccular movements normal; neck is supple without thyromegally  Lynphnodes: Supraclavicular and inguinal nodes not enlarged  Abdomen: Soft, non-tender, no ascites, no organomegally, no masses, no hernias  Pelvic:  EGBUS: Normal female the clitoris appears to be inflamed and enlarged and tender. Just above the clitoris and subcutaneous tissue is a separate nodule. Vagina: The anterior vaginal wall is a nodular lesion approximately halfway down the vagina which is friable and appears to be viable tumor. The patient is too uncomfortable to allow biopsy today. Urethra and Bladder: Normal, non-tender  Cervix: The upper vagina is agglutinated am unable to visualize the cervix. There is some blood noted after inserting a speculum. Uterus: Difficult to outline secondary to very large pelvic mass Bi-manual examination: Non-tender; believe the tumor extends into the parametria but not to the sidewall as previously had been noted. Rectal: normal sphincter tone, no masses, no blood  Lower extremities: No edema or varicosities.        Allergies  Allergen Reactions  .  Latex Itching and Swelling    Hand swelling  . Penicillins Hives and Swelling    Past Medical History  Diagnosis Date  . Hemorrhoids   . Elevated CEA   . Retroperitoneal lymphadenopathy   . Iron deficiency anemia   . Environmental allergies   . Low back pain   . Asthma   . Chronic constipation   . Eczema   . History of radiation therapy 02/13/2013-03/17/2013    pelvis and periaortic area 45 gray  . Extrinsic ureteral obstruction     bilateral malignant external ureter obstruction  . 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 History of dysfunctional uterine bleeding     stopped bleeding  in approx.  feb  2015    Past Surgical History  Procedure Laterality Date  . Colposcopy    . Tonsillectomy  1960's  . Cystoscopy w/ ureteral stent placement Bilateral 02/16/2013    Procedure: CYSTOSCOPY WITH BILATERAL  RETROGRADE PYELOGRAM/BILATERAL  STENT PLACEMENT;  Surgeon: Ardis Hughs, MD;  Location: [facility];  Service: Urology;  Laterality: Bilateral;  . Cystoscopy with retrograde pyelogram, ureteroscopy and stent placement Bilateral 05/12/2013    Procedure: BILATERAL STENT EXCHANGE/BILATERAL RETROGRADE PYELOGRAM;  Surgeon: Ardis Hughs, MD;  Location: [facility];  Service: Urology;  Laterality: Bilateral;    Current Outpatient Prescriptions  Medication Sig Dispense Refill  . albuterol (PROVENTIL HFA;VENTOLIN HFA) 108 (90 BASE) MCG/ACT inhaler Inhale into the lungs every 6 (six) hours as needed for wheezing or shortness of breath.      . bisacodyl (DULCOLAX) 5 MG EC tablet Take 5 mg by mouth daily as needed for moderate constipation.      . Cholecalciferol (VITAMIN D3) 5000 UNITS CAPS Take 1 capsule by mouth daily. Take Monday thru Friday      . clobetasol cream (TEMOVATE) 1.02 % Apply 1 application topically 2 (two) times  daily as needed.       . fentaNYL (DURAGESIC - DOSED MCG/HR) 75 MCG/HR Place 1 patch (75 mcg total) onto the skin every 3 (three) days.  10 patch  0  . folic acid (FOLVITE) 1 MG tablet Take 1 mg by mouth daily.      Marland Kitchen HYDROmorphone (DILAUDID) 4 MG tablet Take one 1-2  tablet by mouth every 4 to 6 hours as needed for breakthrough pain  10 tablet  0  . ibuprofen (ADVIL,MOTRIN) 200 MG tablet Take 200 mg by mouth every 6 (six) hours as needed.      Marland Kitchen LORazepam (ATIVAN) 1 MG tablet Take 1 tablet under tongue or swallow every 6 hours as needed for nausea. Will make drowsy.  30 tablet  0  . magnesium hydroxide (MILK OF MAGNESIA) 400 MG/5ML suspension Take 30 mLs by mouth daily as needed for mild constipation.      . Meth-Hyo-M Bl-Na Phos-Ph Sal (URELLE PO) Take 1 tablet by mouth 4 (four) times daily as needed (prescribed by Dr Haskell Flirt 03/11/13.---   pt takes one tab daily in am).       . ondansetron (ZOFRAN) 8 MG tablet Take 1-2 tablets (8-16 mg total) by mouth every 12 (twelve) hours as needed for nausea or vomiting.  30 tablet  1  .  polyethylene glycol (MIRALAX / GLYCOLAX) packet Take 17 g by mouth daily.      Marland Kitchen triamcinolone cream (KENALOG) 0.1 % Apply 1 application topically as needed.      . vitamin B-12 (CYANOCOBALAMIN) 1000 MCG tablet Take 1,000 mcg by mouth daily.      . tamsulosin (FLOMAX) 0.4 MG CAPS capsule Take 0.4 mg by mouth daily.        No current facility-administered medications for this visit.    History   Social History  . Marital Status: Single    Spouse Name: N/A    Number of Children: 0  . Years of Education: N/A   Occupational History  . physical therapist    Social History Main Topics  . Smoking status: Never Smoker   . Smokeless tobacco: Never Used  . Alcohol Use: No  . Drug Use: No  . Sexual Activity: Not on file   Other Topics Concern  . Not on file   Social History Narrative  . No narrative on file    Family History  Problem Relation Age of  Onset  . Heart disease Mother   . Heart disease Father   . Hypertension Father   . Leukemia Paternal [place], MD 06/09/2013, 12:14 PM

## 2013-06-20 ENCOUNTER — Telehealth: Payer: Self-pay | Admitting: Gynecology

## 2013-06-20 NOTE — Telephone Encounter (Signed)
Path report phoned to patient.  Dr. Sondra Come will schedule a return appointment to consider further therapy

## 2013-06-21 ENCOUNTER — Encounter (HOSPITAL_BASED_OUTPATIENT_CLINIC_OR_DEPARTMENT_OTHER): Payer: Self-pay | Admitting: Gynecology

## 2013-06-21 NOTE — Progress Notes (Signed)
GYN Location of Tumor / Histology: Stage IIIB carcinoma of the cervix   Patient presented  months ago with symptoms of:   Biopsies revealed: 06/16/13  Diagnosis 1. Vagina, biopsy, anterior - POSITIVE FOR INVASIVE POORLY DIFFERENTIATED SQUAMOUS CELL CARCINOMA. 2. Cervix, biopsy - FRAGMENTS OF BENIGN ENDOMETRIAL POLYP, NO EVIDENCE OF HYPERPLASIA OR MALIGNANCY. - FRAGMENTS OF CERVICAL STROMA WITH ASSOCIATED GRANULATION TISSUE AND CHRONIC INFLAMMATION, NO DYSPLASIA OR MALIGNANCY. 3. Soft tissue, NOS, clitoral - METASTATIC POORLY DIFFERENTIATED SQUAMOUS CELL CARCINOMA WITH ASSOCIATED ANGIOLYMPHATIC INVASION, FOCALLY EXTENDING TO THE INKED CAUTERIZED DEEP MARGIN.  Past/Anticipated interventions by Gyn/Onc surgery, if any: 06/16/13 Procedure: VAGINAL BIOPSY,  CLITORIS BIOPSY;  Surgeon: Alvino Chapel, MD;  Location: [place];  Service: Gynecology;  Laterality: N/A;  Past/Anticipated interventions by medical oncology, if any: Dr.Livesay appt 07/03/13 ith lab work first  Weight changes, if any: steady  Bowel/Bladder complaints, if any: constipation,takes stool softners, spotting blood  Wears pad after bx   Nausea/Vomiting, if any: nausea daily taking zofran   Pain issues, if any:  Left groin straight through to the back, and peri area,swelling, tenderness 5/10 scale  SAFETY ISSUES:  Prior radiation? 02/13/2013-03/17/2013 pelvis and periaortic area 45 gray  Pacemaker/ICD? no  Possible current pregnancy? no  Is the patient on methotrexate? no  Current Complaints / other details:

## 2013-06-22 ENCOUNTER — Encounter: Payer: Self-pay | Admitting: Radiation Oncology

## 2013-06-22 ENCOUNTER — Ambulatory Visit
Admission: RE | Admit: 2013-06-22 | Discharge: 2013-06-22 | Disposition: A | Discharge status: Home / Self Care | Payer: BC Managed Care – PPO | Source: Ambulatory Visit | Attending: Radiation Oncology | Admitting: Radiation Oncology

## 2013-06-22 ENCOUNTER — Telehealth: Payer: Self-pay | Admitting: *Deleted

## 2013-06-22 VITALS — BP 130/85 | HR 88 | Temp 97.5°F | Resp 20 | Ht 68.0 in | Wt 167.2 lb

## 2013-06-22 DIAGNOSIS — N322 Vesical fistula, not elsewhere classified: Secondary | ICD-10-CM | POA: Insufficient documentation

## 2013-06-22 DIAGNOSIS — Z9221 Personal history of antineoplastic chemotherapy: Secondary | ICD-10-CM | POA: Insufficient documentation

## 2013-06-22 DIAGNOSIS — Z510 Encounter for antineoplastic radiation therapy: Secondary | ICD-10-CM | POA: Diagnosis present

## 2013-06-22 DIAGNOSIS — C539 Malignant neoplasm of cervix uteri, unspecified: Secondary | ICD-10-CM | POA: Diagnosis not present

## 2013-06-22 DIAGNOSIS — C786 Secondary malignant neoplasm of retroperitoneum and peritoneum: Secondary | ICD-10-CM | POA: Diagnosis not present

## 2013-06-22 NOTE — Progress Notes (Signed)
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 Lanae Crumbly for consideration for additional radiation therapy as part of management of her advanced cervical cancer. Patient completed radiation to the pelvis and periaortic area along with radiosensitizing chemotherapy 3 months ago. She had extensive pelvic lymphadenopathy, a greatly advanced cervical mass and periaortic adenopathy. The patient was seen approximately 6 weeks out from her radiation therapy and on pelvic exam was noted to have persistent disease in the vaginal vault. Patient was referred to radiation oncology at [facility] for consideration for low dose rate intracavitary/interstitial implant. This therapy was not recommended at that time but recommendations were for a PET scan and possibly additional external beam treatment.  Patient did complete her PET scan which showed a dramatic response to her external beam and radiosensitizing chemotherapy with resolution of her extensive pelvic and periaortic adenopathy. The PET scan showed possible metastasis to the perineum.  She was seen by gynecologic oncology and confirmed a lesion along the perineum. Patient was taken to the operating room for exam under anesthesia biopsies  of the cervical area as well as the vaginal area. She also underwent excision of 2 nodules which were just superior to the clitoris along the vulvar area. Cervical biopsies revealed no active cancer however biopsies from the anterior vaginal area revealed poorly differentiated squamous cell  carcinoma. The soft tissue mass removed from the perineum revealed metastatic poorly differentiated squamous cell carcinoma. The tumor did extend focally extend to the inked cauterized deep margin.  With these findings the patient is now seen in radiation oncology for consideration for additional treatment.      She has some pain along the left pelvis area possibly related to her stents. She continues on Duragesic 75 mcg per hour for pain control. She has had minimal vaginal bleeding since her biopsies. She's had some occasional urinary incontinence but no extensive leakage through the vaginal area.                ALLERGIES:  is allergic to latex and penicillins.  Meds: Current Outpatient Prescriptions  Medication Sig Dispense Refill  . albuterol (PROVENTIL HFA;VENTOLIN HFA) 108 (90 BASE) MCG/ACT inhaler Inhale into the lungs every 6 (six) hours as needed for wheezing or shortness of breath.      . bisacodyl (DULCOLAX) 5 MG EC tablet Take 5 mg by mouth daily as needed for moderate constipation.      . clobetasol cream (TEMOVATE) 6.22 % Apply 1 application topically 2 (two) times daily as needed.       Marland Kitchen ibuprofen (ADVIL,MOTRIN) 200 MG tablet Take 200 mg by mouth every 6 (six) hours as needed.      Marland Kitchen LORazepam (ATIVAN) 1 MG tablet Take 1 tablet under tongue or swallow every 6 hours as needed for nausea. Will make drowsy.  30 tablet  0  . magnesium hydroxide (MILK OF MAGNESIA) 400 MG/5ML suspension Take 30 mLs by mouth daily as needed for mild constipation.      . Meth-Hyo-M Bl-Na Phos-Ph Sal (URELLE PO)  Take 1 tablet by mouth 2 (two) times daily.       . ondansetron (ZOFRAN) 8 MG tablet Take 1-2 tablets (8-16 mg total) by mouth every 12 (twelve) hours as needed for nausea or vomiting.  30 tablet  1  . triamcinolone cream (KENALOG) 0.1 % Apply 1 application topically as needed.      . Cholecalciferol (VITAMIN D3) 5000 UNITS CAPS Take 1 capsule by mouth daily. Take Monday thru Friday      . fentaNYL  (DURAGESIC - DOSED MCG/HR) 75 MCG/HR Place 1 patch (75 mcg total) onto the skin every 3 (three) days.  10 patch  0  . folic acid (FOLVITE) 1 MG tablet Take 1 mg by mouth daily.      Marland Kitchen HYDROmorphone (DILAUDID) 4 MG tablet Take one 1-2  tablet by mouth every 4 to 6 hours as needed for breakthrough pain  10 tablet  0  . tamsulosin (FLOMAX) 0.4 MG CAPS capsule Take 0.4 mg by mouth daily.       . vitamin B-12 (CYANOCOBALAMIN) 1000 MCG tablet Take 1,000 mcg by mouth daily.       No current facility-administered medications for this encounter.    Physical Findings: The patient is in no acute distress. Patient is alert and oriented.  height is 5\' 8"  (1.727 m) and weight is 167 lb 3.2 oz (75.841 kg). Her oral temperature is 97.5 F (36.4 C). Her blood pressure is 130/85 and her pulse is 88. Her respiration is 20. Marland Kitchen No palpable supraclavicular or axillary adenopathy. The lungs are clear to auscultation. The heart has regular rhythm and rate. The abdomen is soft and nontender with normal bowel sounds. On pelvic examination sutures are noted along the anterior vulvar area. This area is healing well without signs of infection. On pelvic examination a small speculum was used for the exam. The proximal vagina is stenosed. Along the mid to lower anterior vaginal area is a approximately 3 cm exophytic lesion. Questionable possible nodule along the posterior vaginal wall along the mid to distal vagina.  Lab Findings: Lab Results  Component Value Date   WBC 5.9 05/08/2013   HGB 11.5* 06/16/2013   HCT 35.4 05/08/2013   MCV 90.1 05/08/2013   PLT 236 05/08/2013      Radiographic Findings: Chest 2 View  06/16/2013   CLINICAL DATA:  Preop  EXAM: CHEST  2 VIEW  COMPARISON:  Prior CT from 05/05/2013  FINDINGS: The cardiac and mediastinal silhouettes are stable in size and contour, and remain within normal limits.  The lungs are normally inflated. No airspace consolidation, pleural effusion, or pulmonary edema is  identified. There is no pneumothorax.  No acute osseous abnormality identified. Mild irregularity noted at the left anterior 6 rib, stable from prior.  IMPRESSION: No acute cardiopulmonary abnormality.   Electronically Signed   By: Jeannine Boga M.D.   On: 06/16/2013 06:40   Nm Pet Image Restag (ps) Skull Base To Thigh  06/02/2013   CLINICAL DATA:  Subsequent treatment strategy for cervical cancer.  EXAM: NUCLEAR MEDICINE PET SKULL BASE TO THIGH  TECHNIQUE: 8.8 mCi F-18 FDG was injected intravenously. Full-ring PET imaging was performed from the skull base to thigh after the radiotracer. CT data was obtained and used for attenuation correction and anatomic localization.  FASTING BLOOD GLUCOSE:  Value: 93 mg/dl  COMPARISON:  CTs of the chest, abdomen and pelvis 05/05/2013. PET-CT 02/07/2013.  FINDINGS: NECK  No hypermetabolic cervical lymph nodes are identified.There are no  lesions of the pharyngeal mucosal space.  CHEST  There are no hypermetabolic mediastinal, hilar or axillary lymph nodes. Axillary nodes do not show increased activity. There is no hypermetabolic pulmonary activity. The lungs are clear.  ABDOMEN/PELVIS  There is no hypermetabolic activity within the liver, adrenal glands, spleen or pancreas. Bilateral ureteral stents have been placed with patency of the ureters. There is mild asymmetric dilatation of the right renal pelvis. Previously demonstrated retroperitoneal and pelvic hypermetabolic lymphadenopathy has resolved. However, there is residual hypermetabolic activity within the uterine myometrium, nonspecific. This demonstrates an SUV max of 16.2. There is also hypermetabolic activity within the perineum near the vaginal introitus. This demonstrates an SUV max of 12.7. There are 2 additional small foci of hypermetabolic activity within the anterior perineum. The more posterior component has no CT correlate and is probably due to urine contamination. Anteriorly, there is a 1.5 cm soft  tissue nodule on image 192 which is hypermetabolic with an SUV max of 7.9, potentially a small metastasis. There is no abnormal activity within the cystic left adnexal lesion.  SKELETON  There is no hypermetabolic activity to suggest osseous metastatic disease.  IMPRESSION: 1. Interval resolution of hypermetabolic retroperitoneal and pelvic lymphadenopathy. 2. There is residual nonspecific activity within the uterine myometrium and vagina which is nonspecific and potentially related to interval therapy. Residual tumor in these areas cannot be excluded. 3. New small focus of hypermetabolic activity within the anterior perineum corresponding with a small soft tissue nodule in the labia majora, possibly a metastasis. 4. No distant metastases identified. 5. Resolved hydronephrosis following bilateral ureteral stent placement.   Electronically Signed   By: Camie Patience M.D.   On: 06/02/2013 15:22    Impression:  Stage IIIB squamous cell carcinoma cervix, with persistent disease in the anterior vaginal region and metastasis to the perineum.  The patient has significant disease in the anterior vaginal area and with progression may erode into the bladder with development of a fistula. She would be a candidate for intracavitary brachytherapy treatments to help manage this issue. The patient tolerated the exam reasonably well today and I do believe I could place the vaginal cylinder without anesthesia. Patient will be set up for her first intracavitary brachytherapy treatment June 16. Subsequent treatments will be with our new high-dose-rate treatment equipment in early July. With this new equipment and multilumen capability may be a to preferentially direct the radiation dose toward the anterior vaginal region. The patient may require radiation therapy to the perineum in light of her deep positive surgical margin. This area may possibly be treated with electron therapy but will wait to consider treatment to this area in  light of her recent surgery.  Plan:  Simulation, planning and first intracavitary brachytherapy treatment next week.   ____________________________________ Blair Promise, MD

## 2013-06-22 NOTE — Telephone Encounter (Signed)
Called patient to inform of HDR Case for 06-27-13, spoke with patient and she is aware of these appts.

## 2013-06-26 ENCOUNTER — Telehealth: Payer: Self-pay | Admitting: *Deleted

## 2013-06-26 NOTE — Progress Notes (Signed)
GYN Location of Tumor / Histology: Stage IIIB carcinoma of the cervix   Patient presented months ago with symptoms of:   Biopsies revealed: 06/16/13  Diagnosis  1. Vagina, biopsy, anterior  - POSITIVE FOR INVASIVE POORLY DIFFERENTIATED SQUAMOUS CELL CARCINOMA.  2. Cervix, biopsy  - FRAGMENTS OF BENIGN ENDOMETRIAL POLYP, NO EVIDENCE OF HYPERPLASIA OR  MALIGNANCY.  - FRAGMENTS OF CERVICAL STROMA WITH ASSOCIATED GRANULATION TISSUE AND CHRONIC  INFLAMMATION, NO DYSPLASIA OR MALIGNANCY.  3. Soft tissue, NOS, clitoral  - METASTATIC POORLY DIFFERENTIATED SQUAMOUS CELL CARCINOMA WITH ASSOCIATED  ANGIOLYMPHATIC INVASION, FOCALLY EXTENDING TO THE INKED CAUTERIZED DEEP MARGIN.   Past/Anticipated interventions by Gyn/Onc surgery, if any: 06/16/13 Procedure: VAGINAL BIOPSY, CLITORIS BIOPSY; Surgeon: Alvino Chapel, MD; Location: [place]; Service: Gynecology; Laterality: N/A;   Past/Anticipated interventions by medical oncology, if any: Dr.Livesay appt 07/03/13 ith lab work first   Weight changes, if any: steady   Bowel/Bladder complaints, if any: constipation,takes stool softners, spotting blood Wears pad after bx   Nausea/Vomiting, if any: nausea daily taking zofran   Pain issues, if any: Left groin straight through to the back, and peri area,swelling, tenderness 5/10 scale   SAFETY ISSUES:  Prior radiation? 02/13/2013-03/17/2013 pelvis and periaortic area 45 gray  Pacemaker/ICD? no  Possible current pregnancy? no  Is the patient on methotrexate? no  Current Complaints / other details:

## 2013-06-26 NOTE — Telephone Encounter (Signed)
Called patient to remind of HDR Tx. For 06-27-13, spoke with patient and she is aware of this appt.

## 2013-06-27 ENCOUNTER — Ambulatory Visit: Payer: BC Managed Care – PPO | Admitting: Radiation Oncology

## 2013-06-27 ENCOUNTER — Encounter: Payer: Self-pay | Admitting: Radiation Oncology

## 2013-06-27 ENCOUNTER — Ambulatory Visit: Payer: BC Managed Care – PPO

## 2013-06-27 ENCOUNTER — Ambulatory Visit
Admission: RE | Admit: 2013-06-27 | Discharge: 2013-06-27 | Disposition: A | Discharge status: Home / Self Care | Payer: BC Managed Care – PPO | Source: Ambulatory Visit | Attending: Radiation Oncology | Admitting: Radiation Oncology

## 2013-06-27 VITALS — BP 135/87 | HR 81 | Temp 97.8°F | Resp 20 | Wt 167.2 lb

## 2013-06-27 DIAGNOSIS — C539 Malignant neoplasm of cervix uteri, unspecified: Secondary | ICD-10-CM

## 2013-06-27 DIAGNOSIS — Z510 Encounter for antineoplastic radiation therapy: Secondary | ICD-10-CM | POA: Diagnosis not present

## 2013-06-27 NOTE — Progress Notes (Signed)
Patient arrived c/o nausea, but took her zofran, very little spotting blood stated, no discharge, here for HDR vag Cuff treatment today ,meds updated, she isn't taking any vitamins 7:54 AM

## 2013-06-27 NOTE — Progress Notes (Signed)
   Department of Radiation Oncology  Phone:  [phone] Fax:        [phone]  Verification simulation note  The patient was placed on the operating table in the high-dose-rate treatment room. The patient's custom vaginal cylinder was placed in the proximal vagina. This was affixed to the CT/MR stabilization plate to prevent slippage. Patient then had a fiducial marker placement within the vaginal cylinder. An AP and lateral film was obtained documenting accurate position of the vaginal cylinder when compared to her planning films earlier in the day.    High-dose-rate Brachytherapy treatment note   The remote afterloading device was affixed to the vaginal cylinder by catheter system.  the patient then proceeded to undergo her high-dose-rate treatment directed at the vaginal vault. The patient was treated with a 2 cm vaginal cylinder. Prescription dose to the mucosal surface (6 gray). Iridium 192 was the high-dose-rate source. The total of 13 dwell positions were used to deliver the patient's treatment. She was treated for 245.7 seconds. Patient tolerated the procedure well. After completion of her therapy a radiation survey was performed documenting return of the iridium source into the Nucletron safe.  Blair Promise M.D.

## 2013-06-27 NOTE — Progress Notes (Signed)
   Department of Radiation Oncology  Phone:  [phone] Fax:        [phone]  Vaginal brachytherapy procedure note  This was taken to the nursing suite and placed in the dorsolithotomy position on the examination table. The patient she did undergo a speculum exam. The  tumor along the anterior vaginal vault was noted. Patient then proceeded to undergo fitting for her custom vaginal cylinder. Due to the narrowed vaginal vault only a 2 cm diameter cylinder could be placed. Patient tolerated the procedure well. She did have some mild vaginal bleeding during the procedure from her exophytic tumor.   Simple treatment device note  Patient had construction of her custom vaginal cylinder for treatment. She will be treated with four 2.0 cm diameter cylinders placed within the vaginal vault.   Blair Promise, MD

## 2013-06-27 NOTE — Progress Notes (Signed)
  Radiation Oncology         [phone] ________________________________  Name: Gina Barnett MRN: [id number]  Date: 06/27/2013  DOB: November 23, 1955  SIMULATION AND TREATMENT PLANNING NOTE  DIAGNOSIS: Persistent/ locally advanced cervical cancer  NARRATIVE:  The patient was brought to the [place] suite.  Identity was confirmed.  All relevant records and images related to the planned course of therapy were reviewed.  The patient freely provided informed written consent to proceed with treatment after reviewing the details related to the planned course of therapy. The consent form was witnessed and verified by the simulation staff.  Then, the patient was set-up in a stable reproducible  supine position for radiation therapy.  the patient's custom vaginal cylinder was placed in the proximal vagina. This was affixed to the CT/MR stabilization plate to prevent slippage. CT images were obtained.  Surface markings were placed.  The CT images were loaded into the planning software.  Then the target and avoidance structures were contoured.  Treatment planning then occurred.  The radiation prescription was entered and confirmed.    PLAN:  The patient will receive 6 Gy in 1 fraction directed at the vaginal mucosal surface. She will be treated with high dose rate remote afterloading equipment using iridium 192 as the high-dose-rate source.  ________________________________

## 2013-07-02 ENCOUNTER — Other Ambulatory Visit: Payer: Self-pay | Admitting: Oncology

## 2013-07-03 ENCOUNTER — Other Ambulatory Visit: Payer: BC Managed Care – PPO

## 2013-07-03 ENCOUNTER — Ambulatory Visit: Payer: BC Managed Care – PPO | Admitting: Oncology

## 2013-07-06 ENCOUNTER — Telehealth: Payer: Self-pay | Admitting: Oncology

## 2013-07-06 ENCOUNTER — Other Ambulatory Visit: Payer: Self-pay

## 2013-07-06 DIAGNOSIS — C539 Malignant neoplasm of cervix uteri, unspecified: Secondary | ICD-10-CM

## 2013-07-06 MED ORDER — FENTANYL 75 MCG/HR TD PT72: 75.0000 ug | MEDICATED_PATCH | TRANSDERMAL | Status: DC

## 2013-07-06 NOTE — Telephone Encounter (Signed)
, °

## 2013-07-06 NOTE — Telephone Encounter (Signed)
Gina Barnett needs a refill on her Duragesic patches of 75 mcg.  Sh cannot pick up the prescription today but will pick up the prescription tom orrow morning as it needs changing this evening.  Offered to patient that she could pick prescription up at 1700 but she cont not be here by then. Prescription placed in the folder to pick up prescripitions with injection nurse.

## 2013-07-07 ENCOUNTER — Telehealth: Payer: Self-pay | Admitting: Radiation Oncology

## 2013-07-07 NOTE — Telephone Encounter (Signed)
CALLED PATIENT TO INFORM OF HDR APPT. FOR 07-11-13, SPOKE WITH PATIENT AND SHE WILL ARRIVE @ 8 ON 07-11-13

## 2013-07-10 ENCOUNTER — Other Ambulatory Visit: Payer: Self-pay | Admitting: Urology

## 2013-07-11 ENCOUNTER — Ambulatory Visit
Admission: RE | Admit: 2013-07-11 | Discharge: 2013-07-11 | Disposition: A | Discharge status: Home / Self Care | Payer: BC Managed Care – PPO | Source: Ambulatory Visit | Attending: Radiation Oncology | Admitting: Radiation Oncology

## 2013-07-11 ENCOUNTER — Encounter: Payer: Self-pay | Admitting: Radiation Oncology

## 2013-07-11 VITALS — BP 150/88 | HR 72 | Temp 98.0°F | Ht 68.0 in | Wt 165.0 lb

## 2013-07-11 DIAGNOSIS — C539 Malignant neoplasm of cervix uteri, unspecified: Secondary | ICD-10-CM

## 2013-07-11 DIAGNOSIS — Z510 Encounter for antineoplastic radiation therapy: Secondary | ICD-10-CM | POA: Diagnosis not present

## 2013-07-11 NOTE — Progress Notes (Signed)
Department of Radiation Oncology  Phone: [phone]  Fax: [phone]   07/11/13  Verification simulation note   The patient was placed on the HDR treatment table in the high-dose-rate treatment room. The patient's custom vaginal cylinder was placed in the proximal vagina. This was affixed to the CT/MR stabilization plate to prevent slippage. Patient then had a fiducial marker placement within the vaginal cylinder. An AP and lateral film was obtained documenting accurate position of the vaginal cylinder when compared to her planning films earlier in the day.    High-dose-rate Brachytherapy treatment note    The remote afterloading device was affixed to the vaginal cylinder by catheter system. the patient then proceeded to undergo her high-dose-rate treatment directed at the vaginal vault. The patient was treated with a 2 cm vaginal cylinder. Prescription dose to the mucosal surface (6 gray). Iridium 192 was the high-dose-rate source. The total of 12 dwell positions were used to deliver the patient's treatment. She was treated for 147.5 seconds. Patient tolerated the procedure well. After completion of her therapy a radiation survey was performed documenting return of the iridium source into the GammaMed plus safe.     Blair Promise M.D.

## 2013-07-11 NOTE — Progress Notes (Signed)
Editor: Blair Promise, MD (Physician)       Department of Radiation Oncology  Phone: [phone]  Fax: [phone]   07/11/13   Vaginal brachytherapy procedure note   This was taken to the nursing suite and placed in the dorsolithotomy position on the examination table. The patient she did undergo a speculum exam. The tumor along the anterior vaginal vault was noted. Patient then proceeded to undergo fitting for her custom vaginal cylinder. Due to the narrowed vaginal vault only a 2 cm diameter cylinder could be placed. Patient tolerated the procedure well. She did have some mild vaginal bleeding during the procedure from her exophytic tumor.    Simple treatment device note    Patient had construction of her custom vaginal cylinder for treatment. She will be treated with four 2.0 cm diameter cylinders placed within the vaginal vault.    Blair Promise, MD

## 2013-07-11 NOTE — Progress Notes (Signed)
Please see the Nurse Progress Note in the MD Initial Consult Encounter for this patient. 

## 2013-07-11 NOTE — Progress Notes (Signed)
Gina Barnett here for HDR treatment.  She is having pain in her left side in the front and back that she rates at a 5/10.  She is using a 75 mcg fentanyl and Ibuprofen 1-2 tablets twice a day.  She reports constipation and is taking milk of magnesia as needed.  She denies vaginal/rectal bleeding.

## 2013-07-11 NOTE — Progress Notes (Signed)
Editor: Blair Promise, MD (Physician)        Radiation Oncology [phone]   ________________________________  Name: Gina Barnett MRN: [id number]   Date: 07/11/2013 DOB: [birth year]    SIMULATION AND TREATMENT PLANNING NOTE   DIAGNOSIS: Persistent/ locally advanced cervical cancer   NARRATIVE: The patient was brought to the [place] suite. Identity was confirmed. All relevant records and images related to the planned course of therapy were reviewed. The patient freely provided informed written consent to proceed with treatment after reviewing the details related to the planned course of therapy. The consent form was witnessed and verified by the simulation staff. Then, the patient was set-up in a stable reproducible supine position for radiation therapy. the patient's custom vaginal cylinder was placed in the proximal vagina. This was affixed to the CT/MR stabilization plate to prevent slippage. CT images were obtained. Surface markings were placed. The CT images were loaded into the planning software. Then the target and avoidance structures were contoured. Treatment planning then occurred. The radiation prescription was entered and confirmed.   PLAN: The patient will receive 6 Gy in 1 fraction directed at the vaginal mucosal surface. She will be treated with high dose rate remote afterloading equipment using iridium 192 as the high-dose-rate source.   Blair Promise, M.D.

## 2013-07-13 DIAGNOSIS — Z510 Encounter for antineoplastic radiation therapy: Secondary | ICD-10-CM | POA: Diagnosis not present

## 2013-07-17 ENCOUNTER — Telehealth: Payer: Self-pay | Admitting: *Deleted

## 2013-07-17 NOTE — Telephone Encounter (Signed)
CALLED PATIENT TO REMIND OF HDR [place] 07-18-13 @ 1 PM, SPOKE WITH PATIENT AND SHE IS AWARE OF THIS APPT.

## 2013-07-18 ENCOUNTER — Ambulatory Visit: Payer: BC Managed Care – PPO | Admitting: Radiation Oncology

## 2013-07-18 ENCOUNTER — Ambulatory Visit
Admission: RE | Admit: 2013-07-18 | Discharge: 2013-07-18 | Disposition: A | Discharge status: Home / Self Care | Payer: BC Managed Care – PPO | Source: Ambulatory Visit | Attending: Radiation Oncology | Admitting: Radiation Oncology

## 2013-07-18 DIAGNOSIS — Z510 Encounter for antineoplastic radiation therapy: Secondary | ICD-10-CM | POA: Diagnosis not present

## 2013-07-18 DIAGNOSIS — C539 Malignant neoplasm of cervix uteri, unspecified: Secondary | ICD-10-CM

## 2013-07-18 NOTE — Progress Notes (Signed)
  Department of Radiation Oncology  Phone: [phone]  Fax: [phone]    07/18/13  Vaginal brachytherapy procedure note    The patient was taken to the HDR suite and placed in the dorsolithotomy position on the examination table. The patient she did undergo  exam. The tumor along the anterior vaginal vault was noted. Patient then proceeded to undergo placement of  her custom vaginal cylinder. Due to the narrowed vaginal vault only a 2 cm diameter cylinder could be placed. Patient tolerated the procedure well.   Simple treatment device note   Patient had construction of her custom vaginal cylinder for treatment. She will be treated with four 2.0 cm diameter cylinders placed within the vaginal vault.     Verification simulation note   The patient was placed on the HDR treatment table in the high-dose-rate treatment room. The patient's custom vaginal cylinder was placed in the proximal vagina. This was affixed to the CT/MR stabilization plate to prevent slippage. Patient then had a fiducial marker placement within the vaginal cylinder. An AP and lateral film was obtained documenting accurate position of the vaginal cylinder when compared to her planning films last week.  High-dose-rate Brachytherapy treatment note   The remote afterloading device was affixed to the vaginal cylinder by catheter system. the patient then proceeded to undergo her third high-dose-rate treatment directed at the vaginal vault. The patient was treated with a 2 cm vaginal cylinder. Prescription dose to the mucosal surface (6 gray). Iridium 192 was the high-dose-rate source. The total of 12 dwell positions were used to deliver the patient's treatment. She was treated for 147.5 seconds. Patient tolerated the procedure well. After completion of her therapy a radiation survey was performed documenting return of the iridium source into the GammaMed plus safe.   Blair Promise M.D.

## 2013-07-23 ENCOUNTER — Other Ambulatory Visit: Payer: Self-pay | Admitting: Oncology

## 2013-07-24 ENCOUNTER — Telehealth: Payer: Self-pay | Admitting: *Deleted

## 2013-07-24 NOTE — Telephone Encounter (Signed)
xxxx 

## 2013-07-24 NOTE — Telephone Encounter (Signed)
CALLED PATIENT TO REMIND OF HDR Gina Barnett 07-25-13 @ 1 PM, SPOKE WITH PATIENT AND SHE IS AWARE OF THIS [place].

## 2013-07-25 ENCOUNTER — Ambulatory Visit: Payer: BC Managed Care – PPO | Admitting: Radiation Oncology

## 2013-07-25 ENCOUNTER — Ambulatory Visit
Admission: RE | Admit: 2013-07-25 | Discharge: 2013-07-25 | Disposition: A | Discharge status: Home / Self Care | Payer: BC Managed Care – PPO | Source: Ambulatory Visit | Attending: Radiation Oncology | Admitting: Radiation Oncology

## 2013-07-25 DIAGNOSIS — C539 Malignant neoplasm of cervix uteri, unspecified: Secondary | ICD-10-CM

## 2013-07-25 DIAGNOSIS — Z510 Encounter for antineoplastic radiation therapy: Secondary | ICD-10-CM | POA: Diagnosis not present

## 2013-07-25 NOTE — Progress Notes (Signed)
   Department of Radiation Oncology  Phone:  [phone] Fax:        [phone]  Vaginal brachytherapy procedure note  The patient was taken to the HDR suite and placed in the dorsolithotomy position on the examination table. The patient she did undergo exam. The tumor along the anterior vaginal vault was noted. Patient then proceeded to undergo placement of her custom vaginal cylinder. Due to the narrowed vaginal vault only a 2 cm diameter cylinder could be placed. Patient tolerated the procedure well.   Simple treatment device note   Patient had construction of her custom vaginal cylinder for treatment. She will be treated with four 2.0 cm diameter cylinders placed within the vaginal vault.     Verification simulation note   The patient was placed on the HDR treatment table in the high-dose-rate treatment room. The patient's custom vaginal cylinder was placed in the proximal vagina. This was affixed to the CT/MR stabilization plate to prevent slippage. Patient then had a fiducial marker placement within the vaginal cylinder. An AP and lateral film was obtained documenting accurate position of the vaginal cylinder when compared to her planning films last week.   High-dose-rate Brachytherapy treatment note  The remote afterloading device was affixed to the vaginal cylinder by catheter system. the patient then proceeded to undergo her fourth high-dose-rate treatment directed at the vaginal vault. The patient was treated with a 2 cm vaginal cylinder. Prescription dose to the mucosal surface (6 gray). Iridium 192 was the high-dose-rate source. The total of 12 dwell positions were used to deliver the patient's treatment. She was treated for 180.5 seconds. Patient tolerated the procedure well. After completion of her therapy a radiation survey was performed documenting return of the iridium source into the GammaMed plus safe.  Blair Promise M.D.

## 2013-07-26 ENCOUNTER — Other Ambulatory Visit: Payer: Self-pay | Admitting: *Deleted

## 2013-07-26 ENCOUNTER — Ambulatory Visit (HOSPITAL_BASED_OUTPATIENT_CLINIC_OR_DEPARTMENT_OTHER): Payer: BC Managed Care – PPO | Admitting: Oncology

## 2013-07-26 ENCOUNTER — Other Ambulatory Visit (HOSPITAL_BASED_OUTPATIENT_CLINIC_OR_DEPARTMENT_OTHER): Payer: BC Managed Care – PPO

## 2013-07-26 ENCOUNTER — Telehealth: Payer: Self-pay | Admitting: Oncology

## 2013-07-26 ENCOUNTER — Encounter: Payer: Self-pay | Admitting: Oncology

## 2013-07-26 VITALS — BP 112/73 | HR 92 | Temp 98.4°F | Resp 20 | Ht 68.0 in | Wt 161.7 lb

## 2013-07-26 DIAGNOSIS — C779 Secondary and unspecified malignant neoplasm of lymph node, unspecified: Secondary | ICD-10-CM

## 2013-07-26 DIAGNOSIS — C50919 Malignant neoplasm of unspecified site of unspecified female breast: Secondary | ICD-10-CM

## 2013-07-26 DIAGNOSIS — C539 Malignant neoplasm of cervix uteri, unspecified: Secondary | ICD-10-CM

## 2013-07-26 DIAGNOSIS — D500 Iron deficiency anemia secondary to blood loss (chronic): Secondary | ICD-10-CM

## 2013-07-26 DIAGNOSIS — C7982 Secondary malignant neoplasm of genital organs: Secondary | ICD-10-CM

## 2013-07-26 LAB — COMPREHENSIVE METABOLIC PANEL (CC13)
ALT: <6 U/L (ref 0–55)
AST: 11 U/L (ref 5–34)
Albumin: 3.1 g/dL — ABNORMAL LOW (ref 3.5–5.0)
Alkaline Phosphatase: 82 U/L (ref 40–150)
Anion Gap: 9 mEq/L (ref 3–11)
BILIRUBIN TOTAL: 0.21 mg/dL (ref 0.20–1.20)
BUN: 14 mg/dL (ref 7.0–26.0)
CO2: 29 mEq/L (ref 22–29)
CREATININE: 1.5 mg/dL — AB (ref 0.6–1.1)
Calcium: 10.6 mg/dL — ABNORMAL HIGH (ref 8.4–10.4)
Chloride: 99 mEq/L (ref 98–109)
GLUCOSE: 106 mg/dL (ref 70–140)
Potassium: 4.2 mEq/L (ref 3.5–5.1)
Sodium: 136 mEq/L (ref 136–145)
Total Protein: 7.3 g/dL (ref 6.4–8.3)

## 2013-07-26 LAB — CBC WITH DIFFERENTIAL/PLATELET
BASO%: 0.5 % (ref 0.0–2.0)
BASOS ABS: 0 10*3/uL (ref 0.0–0.1)
EOS ABS: 0.6 10*3/uL — AB (ref 0.0–0.5)
EOS%: 8.8 % — ABNORMAL HIGH (ref 0.0–7.0)
HCT: 35.4 % (ref 34.8–46.6)
HGB: 11.6 g/dL (ref 11.6–15.9)
LYMPH%: 4.7 % — AB (ref 14.0–49.7)
MCH: 31.3 pg (ref 25.1–34.0)
MCHC: 32.7 g/dL (ref 31.5–36.0)
MCV: 95.5 fL (ref 79.5–101.0)
MONO#: 0.4 10*3/uL (ref 0.1–0.9)
MONO%: 5.3 % (ref 0.0–14.0)
NEUT#: 5.8 10*3/uL (ref 1.5–6.5)
NEUT%: 80.7 % — ABNORMAL HIGH (ref 38.4–76.8)
Platelets: 293 10*3/uL (ref 145–400)
RBC: 3.71 10*6/uL (ref 3.70–5.45)
RDW: 12.7 % (ref 11.2–14.5)
WBC: 7.1 10*3/uL (ref 3.9–10.3)
lymph#: 0.3 10*3/uL — ABNORMAL LOW (ref 0.9–3.3)

## 2013-07-26 MED ORDER — ONDANSETRON HCL 8 MG PO TABS: 8.0000 mg | ORAL_TABLET | Freq: Two times a day (BID) | ORAL | Status: DC | PRN

## 2013-07-26 MED ORDER — FENTANYL 75 MCG/HR TD PT72: 75.0000 ug | MEDICATED_PATCH | TRANSDERMAL | Status: DC

## 2013-07-26 MED ORDER — HYDROMORPHONE HCL 4 MG PO TABS: ORAL_TABLET | ORAL | Status: DC

## 2013-07-26 NOTE — Progress Notes (Signed)
OFFICE PROGRESS NOTE   07/26/2013   Physicians:D.ClarkePearson,J.Kinard, C.Melinda Crutch (PCP), Roney Mans, Louis Meckel ([facility]), Ladona Horns   INTERVAL HISTORY:  Patient is seen, alone for visit, for the first time back at this office since late April. In interim she had consultation with Dr Ladona Horns of radiation oncology at [facility] 05-19-2013,  PET/CT 06-02-13 which showed good response of retroperitoneal and pelvic hypermetabolic adenopathy and nothing in chest or upper abdomen, but hypermetabolic activity perineum and vagina with nodularity. 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 She had brachytherapy by Dr Sondra Come from 06-27-13 thru 07-25-13.  She has follow up scheduled with Dr Sondra Come on 08-28-13.  Patient continues duragesic patch 75 mcg every 72 hours, with occasional dilaudid including dose this AM, for pain that is primarily deep anterior pelvic area and also left medial flank since last stent exchange. She has nausea frequently, uses zofran and ativan prn. She is not eating too well and has chronic difficulty sleeping. She did not manage to get back to work, and job has been terminated. I have completed long term disability papers for patient after this visit.  She does not have PAC She has bilateral JJ stents   ONCOLOGIC HISTORY Patient presented with heavy vaginal bleeding, with low back pain and right hip pain. She was seen by Dr Dellis Filbert, with biopsies from upper vagina and lower anterior vagina showing invasive squamous cell carcinoma. She had CT CAP in [facility] system 01-25-13 which showed necrotic mass centered in lower uterine segment and cervix 10.8 x 8.7 cm, left hydronephrosis, pelvic and retroperitoneal node involvement, rectum and sigmoid intimately associated with pelvic mass  but without obstruction, advanced degenerative disc disease at lumbosacral junction. She saw Dr Josephina Shih 01-27-13, with cervix replaced by fungating tumor extending to both pelvic sidewalls and nodular lesion halfway down anterior vaginal wall. Right hip xray 01-27-13 showed no bony mets. PET 02-10-13 at [facility] had new right hydronephrosis in addition to the previous left hydronephrosis, uptake in para-aortic nodes, left iliac nodes, bulky cervical mass, adenopathy adjacent to rectum; lungs and bones ok by PET. Bilateral JJ stents were placed 02-17-12. First CDDP was given 02-20-13. Chemo held 2-16 with acute pain and inadequate hydration; transfused 1 unit PRBCs 2-16 for Hgb 9.6. She had second CDDP on 03-10-13, then chemo held following week due to low counts. External beam RT was given 02-13-13 thru 03-17-13, 45 gray to pelvis and periaortics. She had progression in vagina and vulvar area by 06-2013, with brachytherapy given thru 07-25-13.  Review of systems as above, also: No fever or symptoms of infection. No GERD. No bleeding. No LE swelling or pain. No SOB or other respiratory symptoms. Bowels are moving. She is not using supplements now, disliked Resource/Boost Breeze "too sweet" but will try CIB. Remainder of 10 point Review of Systems negative.  Objective:  Vital signs in last 24 hours:  BP 112/73  Pulse 92  Temp(Src) 98.4 F (36.9 C) (Oral)  Resp 20  Ht 5\' 8"  (1.727 m)  Wt 161 lb 11.2 oz (73.347 kg)  BMI 24.59 kg/m2  LMP 03/11/2013 Weight is down 4 lbs. Alert, oriented and appropriate. Ambulatory without difficulty. Very pleasant as always, quiet.   HEENT:PERRL, sclerae not icteric. Oral mucosa moist without lesions, posterior pharynx clear.  Neck supple. No JVD.  Lymphatics:no cervical,suraclavicular, axillary or inguinal  adenopathy Resp: clear to auscultation bilaterally and normal percussion bilaterally Cardio: regular rate and rhythm. No gallop. GI: soft, nontender including  epigastrium, not distended, no mass or organomegaly. Few bowel sounds.  Musculoskeletal/ Extremities: without pitting edema, cords, tenderness Neuro: no peripheral neuropathy. Otherwise nonfocal. PSYCH  Mood and affect appropriate Skin without rash, ecchymosis, petechiae   Lab Results:  Results for orders placed in visit on 07/26/13  CBC WITH DIFFERENTIAL      Result Value Ref Range   WBC 7.1  3.9 - 10.3 10e3/uL   NEUT# 5.8  1.5 - 6.5 10e3/uL   HGB 11.6  11.6 - 15.9 g/dL   HCT 35.4  34.8 - 46.6 %   Platelets 293  145 - 400 10e3/uL   MCV 95.5  79.5 - 101.0 fL   MCH 31.3  25.1 - 34.0 pg   MCHC 32.7  31.5 - 36.0 g/dL   RBC 3.71  3.70 - 5.45 10e6/uL   RDW 12.7  11.2 - 14.5 %   lymph# 0.3 (*) 0.9 - 3.3 10e3/uL   MONO# 0.4  0.1 - 0.9 10e3/uL   Eosinophils Absolute 0.6 (*) 0.0 - 0.5 10e3/uL   Basophils Absolute 0.0  0.0 - 0.1 10e3/uL   NEUT% 80.7 (*) 38.4 - 76.8 %   LYMPH% 4.7 (*) 14.0 - 49.7 %   MONO% 5.3  0.0 - 14.0 %   EOS% 8.8 (*) 0.0 - 7.0 %   BASO% 0.5  0.0 - 2.0 %    CMET available after visit normal with exception of creatinine up to 1.5, previously 1.0 - 1.2 back to 02-2013, albumin 3.1 and calcium 10.6  Studies/Results:  Gina Barnett, Gina Barnett Collected: 06/16/2013 Client: [facility] Accession: [id number] Received: 06/16/2013 Marti Sleigh, MDFINAL DIAGNOSIS Diagnosis 1. Vagina, biopsy, anterior - POSITIVE FOR INVASIVE POORLY DIFFERENTIATED SQUAMOUS CELL CARCINOMA. 2. Cervix, biopsy - FRAGMENTS OF BENIGN ENDOMETRIAL POLYP, NO EVIDENCE OF HYPERPLASIA OR MALIGNANCY. - FRAGMENTS OF CERVICAL STROMA WITH ASSOCIATED GRANULATION TISSUE AND CHRONIC INFLAMMATION, NO DYSPLASIA OR MALIGNANCY. 3. Soft tissue, NOS, clitoral - METASTATIC POORLY DIFFERENTIATED SQUAMOUS CELL CARCINOMA WITH ASSOCIATED ANGIOLYMPHATIC INVASION, FOCALLY EXTENDING TO THE INKED CAUTERIZED DEEP MARGIN. Microscopic Comment 2. The findings are confirmed by immunostain for CK AE1/AE3. 3. Sections  show areas of metastatic poorly differentiated squamous cell carcinoma, measuring up to 1.7 cm in the greatest dimension. The tumor is focally involving the cauterized inked deep margin. The bilateral resection margins are negative for dysplasia or malignancy. Angiolymphatic invasion has also been Present.   Medications: I have reviewed the patient's current medications. Duragesic, zofran and dilaudid renewed. She dislikes smell of MVI, may be able to tolerate gummies or can be sure to drink [employer] daily.  DISCUSSION: discussed reevaluation by Dr Sondra Come scheduled ~ 4 weeks from completion of brachytherapy to evaluate maximal response of that radiation. With chemistries resulting after visit as above, we will be in contact with her to increase po fluids and will recheck chemistries when she sees Dr Sondra Come on 08-28-13. I have made appointment back to see me in ~ 3 months, tho can adjust that if needed; pain medication can be refilled by this office or any of her oncology physicians  Assessment/Plan:   1. 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 Ongoing need for duragesic and prn dilaudid. Follow up with Dr Sondra Come 08-28-13 .  Slight elevation in creatinine and calcium on labs today, which we will repeat day of Dr Clabe Seal visit + push po fluids 2.JJ stents for bilateral ureteral obstruction. Stent changes by Dr Louis Meckel. Creatinine 1.5 today.  3.iron deficiency anemia related to vaginal bleeding. Continue oral iron.  4.poor nutritional status/ protein calorie malnutrition in setting of chronic illness. She will try [employer]. May need to consider marinol or megace for appetite (not discussed today) 5.never colonoscopy/ ? if mammograms  6.chronic sleep problems.may consider  amitriptyline (not discussed today)   Patient knows to call prior to scheduled visits if needed.  Porche Steinberger P, MD   07/26/2013, 1:55 PM

## 2013-07-26 NOTE — Telephone Encounter (Signed)
per of to sch pt appts-gave pt copy of sch

## 2013-07-27 ENCOUNTER — Other Ambulatory Visit: Payer: Self-pay | Admitting: Oncology

## 2013-07-27 ENCOUNTER — Telehealth: Payer: Self-pay

## 2013-07-27 NOTE — Telephone Encounter (Signed)
Reviewed information regarding labs, appetite, and sleep as noted below by Dr. Marko Plume.  Ms. Wisnieski states that she  has been drinking 4(16oz) bottles of water daily .  Instructed her to increase to 6-7 bottles daily especially with the heat. She is aware of appointment for repeat lab at 1530 on 08-28-13. Ms. Gintz with think about the medication interventions for appetite and sleep and call back and let Dr. Marko Plume know if she will try any of the suggested medications noted below.

## 2013-07-27 NOTE — Telephone Encounter (Signed)
Message copied by Baruch Merl on Thu Jul 27, 2013  5:16 PM ------      Message from: Evlyn Clines P      Created: Thu Jul 27, 2013  2:20 PM       Please let her know chemistries from 7-15 suggest she is a little dehydrated, with creatinine a little higher and calcium slightly higher. She needs to push po fluids, and we will recheck labs when she sees Dr Sondra Come on 8-17.       I wonder if she would like to try something to improve appetite, either marinol (which might also help the ongoing nausea) or megace.       And wondered if she wants to try something else to help sleep, such as amitriptyline, which sometimes can help chronic pain also.            Thanks      Cc LA, TH ------

## 2013-07-28 ENCOUNTER — Telehealth: Payer: Self-pay

## 2013-07-28 NOTE — Telephone Encounter (Signed)
Sent a copy of completed form dated 07-27-13  to HIM to be scanned into patient's EMR and mailed original to Gina Barnett as requested by patient.

## 2013-07-31 ENCOUNTER — Encounter (HOSPITAL_BASED_OUTPATIENT_CLINIC_OR_DEPARTMENT_OTHER): Payer: Self-pay | Admitting: *Deleted

## 2013-07-31 ENCOUNTER — Other Ambulatory Visit: Payer: Self-pay | Admitting: Urology

## 2013-07-31 NOTE — Progress Notes (Signed)
NPO AFTER MN. ARRIVE AT 1100. CURRENT LAB RESULTS IN CHART AND EPIC. MAY TAKE PAIN MED. IF NEEDED AM DOS.

## 2013-08-04 ENCOUNTER — Encounter (HOSPITAL_BASED_OUTPATIENT_CLINIC_OR_DEPARTMENT_OTHER): Payer: BC Managed Care – PPO | Admitting: Anesthesiology

## 2013-08-04 ENCOUNTER — Ambulatory Visit (HOSPITAL_BASED_OUTPATIENT_CLINIC_OR_DEPARTMENT_OTHER): Payer: BC Managed Care – PPO | Admitting: Anesthesiology

## 2013-08-04 ENCOUNTER — Encounter (HOSPITAL_BASED_OUTPATIENT_CLINIC_OR_DEPARTMENT_OTHER): Payer: Self-pay | Admitting: Urology

## 2013-08-04 ENCOUNTER — Encounter (HOSPITAL_BASED_OUTPATIENT_CLINIC_OR_DEPARTMENT_OTHER)
Admission: RE | Disposition: A | Discharge status: Home / Self Care | Payer: Self-pay | Source: Ambulatory Visit | Attending: Urology

## 2013-08-04 ENCOUNTER — Ambulatory Visit (HOSPITAL_BASED_OUTPATIENT_CLINIC_OR_DEPARTMENT_OTHER)
Admission: RE | Admit: 2013-08-04 | Discharge: 2013-08-04 | Disposition: A | Discharge status: Home / Self Care | Payer: BC Managed Care – PPO | Source: Ambulatory Visit | Attending: Urology | Admitting: Urology

## 2013-08-04 DIAGNOSIS — Z923 Personal history of irradiation: Secondary | ICD-10-CM | POA: Insufficient documentation

## 2013-08-04 DIAGNOSIS — Z466 Encounter for fitting and adjustment of urinary device: Secondary | ICD-10-CM | POA: Insufficient documentation

## 2013-08-04 DIAGNOSIS — N135 Crossing vessel and stricture of ureter without hydronephrosis: Secondary | ICD-10-CM | POA: Insufficient documentation

## 2013-08-04 DIAGNOSIS — N1330 Unspecified hydronephrosis: Secondary | ICD-10-CM | POA: Insufficient documentation

## 2013-08-04 DIAGNOSIS — Z9221 Personal history of antineoplastic chemotherapy: Secondary | ICD-10-CM | POA: Insufficient documentation

## 2013-08-04 DIAGNOSIS — C539 Malignant neoplasm of cervix uteri, unspecified: Secondary | ICD-10-CM | POA: Insufficient documentation

## 2013-08-04 DIAGNOSIS — J45909 Unspecified asthma, uncomplicated: Secondary | ICD-10-CM | POA: Insufficient documentation

## 2013-08-04 HISTORY — PX: CYSTOSCOPY W/ URETERAL STENT PLACEMENT: SHX1429

## 2013-08-04 SURGERY — CYSTOSCOPY, FLEXIBLE, WITH STENT REPLACEMENT
Anesthesia: General | Site: Ureter | Laterality: Bilateral

## 2013-08-04 MED ORDER — FENTANYL CITRATE 0.05 MG/ML IJ SOLN
25.0000 ug | INTRAMUSCULAR | Status: DC | PRN
  Filled 2013-08-04: qty 1

## 2013-08-04 MED ORDER — MIDAZOLAM HCL 2 MG/2ML IJ SOLN
INTRAMUSCULAR | Status: AC
  Filled 2013-08-04: qty 2

## 2013-08-04 MED ORDER — DEXAMETHASONE SODIUM PHOSPHATE 4 MG/ML IJ SOLN
INTRAMUSCULAR | Status: DC | PRN
  Administered 2013-08-04: 10 mg via INTRAVENOUS

## 2013-08-04 MED ORDER — LIDOCAINE HCL 2 % EX GEL
CUTANEOUS | Status: DC | PRN
  Administered 2013-08-04: 1 via URETHRAL

## 2013-08-04 MED ORDER — METOCLOPRAMIDE HCL 5 MG/ML IJ SOLN
INTRAMUSCULAR | Status: DC | PRN
  Administered 2013-08-04: 10 mg via INTRAVENOUS

## 2013-08-04 MED ORDER — FENTANYL CITRATE 0.05 MG/ML IJ SOLN
INTRAMUSCULAR | Status: AC
  Filled 2013-08-04: qty 4

## 2013-08-04 MED ORDER — BELLADONNA ALKALOIDS-OPIUM 16.2-60 MG RE SUPP
RECTAL | Status: DC | PRN
  Administered 2013-08-04: 1 via RECTAL

## 2013-08-04 MED ORDER — KETOROLAC TROMETHAMINE 30 MG/ML IJ SOLN
INTRAMUSCULAR | Status: DC | PRN
  Administered 2013-08-04: 30 mg via INTRAVENOUS

## 2013-08-04 MED ORDER — LACTATED RINGERS IV SOLN
INTRAVENOUS | Status: DC
  Administered 2013-08-04: 12:00:00 via INTRAVENOUS
  Filled 2013-08-04: qty 1000

## 2013-08-04 MED ORDER — PROPOFOL 10 MG/ML IV BOLUS
INTRAVENOUS | Status: DC | PRN
  Administered 2013-08-04: 200 mg via INTRAVENOUS

## 2013-08-04 MED ORDER — KETOROLAC TROMETHAMINE 30 MG/ML IJ SOLN
15.0000 mg | Freq: Once | INTRAMUSCULAR | Status: DC | PRN
  Filled 2013-08-04: qty 1

## 2013-08-04 MED ORDER — ONDANSETRON HCL 4 MG/2ML IJ SOLN
INTRAMUSCULAR | Status: DC | PRN
  Administered 2013-08-04: 4 mg via INTRAVENOUS

## 2013-08-04 MED ORDER — PROMETHAZINE HCL 25 MG/ML IJ SOLN
6.2500 mg | INTRAMUSCULAR | Status: DC | PRN
  Filled 2013-08-04: qty 1

## 2013-08-04 MED ORDER — STERILE WATER FOR IRRIGATION IR SOLN
Status: DC | PRN
  Administered 2013-08-04: 3000 mL

## 2013-08-04 MED ORDER — LIDOCAINE HCL (CARDIAC) 20 MG/ML IV SOLN
INTRAVENOUS | Status: DC | PRN
  Administered 2013-08-04 (×2): 50 mg via INTRAVENOUS

## 2013-08-04 MED ORDER — CIPROFLOXACIN IN D5W 400 MG/200ML IV SOLN
400.0000 mg | INTRAVENOUS | Status: AC
  Administered 2013-08-04: 400 mg via INTRAVENOUS
  Filled 2013-08-04: qty 200

## 2013-08-04 MED ORDER — FENTANYL CITRATE 0.05 MG/ML IJ SOLN
INTRAMUSCULAR | Status: DC | PRN
  Administered 2013-08-04 (×2): 25 ug via INTRAVENOUS
  Administered 2013-08-04: 50 ug via INTRAVENOUS

## 2013-08-04 MED ORDER — MIDAZOLAM HCL 5 MG/5ML IJ SOLN
INTRAMUSCULAR | Status: DC | PRN
  Administered 2013-08-04: 2 mg via INTRAVENOUS

## 2013-08-04 MED ORDER — BELLADONNA ALKALOIDS-OPIUM 16.2-60 MG RE SUPP
RECTAL | Status: AC
  Filled 2013-08-04: qty 1

## 2013-08-04 SURGICAL SUPPLY — 37 items
ADAPTER CATH URET PLST 4-6FR (CATHETERS) IMPLANT
BAG DRAIN URO-CYSTO SKYTR STRL (DRAIN) ×3 IMPLANT
BASKET LASER NITINOL 1.9FR (BASKET) IMPLANT
BASKET STNLS GEMINI 4WIRE 3FR (BASKET) IMPLANT
BASKET ZERO TIP NITINOL 2.4FR (BASKET) IMPLANT
CANISTER SUCT LVC 12 LTR MEDI- (MISCELLANEOUS) ×3 IMPLANT
CATH INTERMIT  6FR 70CM (CATHETERS) IMPLANT
CATH URET 5FR 28IN CONE TIP (BALLOONS)
CATH URET 5FR 28IN OPEN ENDED (CATHETERS) ×3 IMPLANT
CATH URET 5FR 70CM CONE TIP (BALLOONS) IMPLANT
CATH URET DUAL LUMEN 6-10FR 50 (CATHETERS) IMPLANT
CLOTH BEACON ORANGE TIMEOUT ST (SAFETY) ×3 IMPLANT
DRAPE CAMERA CLOSED 9X96 (DRAPES) ×3 IMPLANT
DRSG TEGADERM 2-3/8X2-3/4 SM (GAUZE/BANDAGES/DRESSINGS) IMPLANT
FIBER LASER FLEXIVA 200 (UROLOGICAL SUPPLIES) IMPLANT
FIBER LASER FLEXIVA 365 (UROLOGICAL SUPPLIES) IMPLANT
GLOVE BIO SURGEON STRL SZ7.5 (GLOVE) ×3 IMPLANT
GLOVE BIOGEL PI IND STRL 7.5 (GLOVE) ×2 IMPLANT
GLOVE BIOGEL PI INDICATOR 7.5 (GLOVE) ×4
GOWN STRL REIN XL XLG (GOWN DISPOSABLE) IMPLANT
GOWN STRL REUS W/TWL LRG LVL3 (GOWN DISPOSABLE) ×3 IMPLANT
GOWN STRL REUS W/TWL XL LVL3 (GOWN DISPOSABLE) ×3 IMPLANT
GUIDEWIRE 0.038 PTFE COATED (WIRE) IMPLANT
GUIDEWIRE ANG ZIPWIRE 038X150 (WIRE) IMPLANT
GUIDEWIRE STR DUAL SENSOR (WIRE) ×3 IMPLANT
IV NS IRRIG 3000ML ARTHROMATIC (IV SOLUTION) IMPLANT
KIT BALLIN UROMAX 15FX10 (LABEL) IMPLANT
KIT BALLN UROMAX 15FX4 (MISCELLANEOUS) IMPLANT
KIT BALLN UROMAX 26 75X4 (MISCELLANEOUS)
NS IRRIG 500ML POUR BTL (IV SOLUTION) IMPLANT
PACK CYSTOSCOPY (CUSTOM PROCEDURE TRAY) ×3 IMPLANT
SET HIGH PRES BAL DIL (LABEL)
SHEATH ACCESS URETERAL 38CM (SHEATH) IMPLANT
SHEATH ACCESS URETERAL 54CM (SHEATH) IMPLANT
STENT URET 6FRX26 CONTOUR (STENTS) ×6 IMPLANT
TUBE FEEDING 8FR 16IN STR KANG (MISCELLANEOUS) IMPLANT
WATER STERILE IRR 3000ML UROMA (IV SOLUTION) ×3 IMPLANT

## 2013-08-04 NOTE — Op Note (Signed)
Preoperative diagnosis: Extrinsic ureteral obstruction secondary to locally advanced cervical cancer   Postoperative diagnosis:  1. As above  Procedure:  1. Cystoscopy, bilateral retrograde pyelogram with interpretation, bilateral ureteral stent exchange (6 [place] x26 cm double-J) 2.  Surgeon: Ardis Hughs, MD   Anesthesia: General   Complications: None   Intraoperative findings: Stents were easily exchanged, there was no encrustation on the stents, the bladder mucosa had some edematous changes as expected with chronic stents, the mucosa appeared unchanged from previous stent exchange in May.  The retrograde pyelograms revealed no evidence of hydronephrosis and normal caliber ureters.  EBL: Minimal   Specimens: None   Indication: Gina Barnett is a 58 y.o. patient with locally advanced cervical cancer with associated bilateral hydronephrosis.  She has bilateral ureteral stents.   She presents today for an stent exchange. After reviewing the management options for treatment, she elected to proceed with the above surgical procedure(s). We have discussed the potential benefits and risks of the procedure, side effects of the proposed treatment, the likelihood of the patient achieving the goals of the procedure, and any potential problems that might occur during the procedure or recuperation. Informed consent has been obtained.   Description of procedure:  The patient was taken to the operating room and general anesthesia was induced. The patient was placed in the dorsal lithotomy position, prepped and draped in the usual sterile fashion, and preoperative antibiotics were administered. A preoperative time-out was performed.  The 69 French 30 cystoscope was then gently passed to the patient's urethra and the bladder. A 360 cystoscopic evaluation was performed with the above findings. The right stent was then grabbed at the distal tip with a stent grasper and pulled to the urethral meatus.  A 0.38 sensor wire was then passed through the tip of the stent into the right renal pelvis. Stent was then removed over the wire.  A 5 French ureteral Sallyanne Havers was then passed over the wire and into the right renal pelvis a retrograde pyelogram was performed. The wire was then passed back through the McKenna catheter the [place] catheter removed. This point the right ureteral stent was passed over the wire that was previously placed in the right renal pelvis and advanced under fluoroscopic guidance into the right renal pelvis. The distal tip of the stent was then pushed to the urethral meatus and the stent was then deployed by pulling the wire a nice curl noted in the renal pelvis as well as the bladder on fluoroscopy.  The cystoscope was then replaced into the patient's bladder and the left ureteral stent was grasped at the distal tip and pulled to the urethral meatus. A second 0.38 wire was in gently passed through the stent and into the left renal pelvis. Stent was then removed over the wire and a 5 French open-ended ureteral Pollock catheter was then passed over the stent and into the left renal pelvis. Retrograde pyelogram was then performed with the above findings. The Pollack catheter was then exchanged for the wire.  The left stent was placed in a similar manner as the right stent. The bladder was then emptied. A B. &O. suppository was in place the patient's rectum. The patient subsequently awoken and returned back in excellent condition.  Ardis Hughs, M.D.    Cc: Dr. Evlyn Clines, MD

## 2013-08-04 NOTE — Transfer of Care (Signed)
Immediate Anesthesia Transfer of Care Note  Patient: Gina Barnett  Procedure(s) Performed: Procedure(s) (LRB): BILATERAL URETERAL STENT EXCHANGE,BILATERAL RETROGRADE (Bilateral)  Patient Location: PACU  Anesthesia Type: General  Level of Consciousness: awake, alert  and oriented  Airway & Oxygen Therapy: Patient Spontanous Breathing and Patient connected to face mask oxygen  Post-op Assessment: Report given to PACU RN and Post -op Vital signs reviewed and stable  Post vital signs: Reviewed and stable  Complications: No apparent anesthesia complications

## 2013-08-04 NOTE — Anesthesia Preprocedure Evaluation (Signed)
Anesthesia Evaluation  Patient identified by MRN, date of birth, ID band Patient awake  General Assessment Comment:Cervical cancer INVASIVE STAGE IIIB SQUAMOUS CELL CARCINOMA  W/ NODAL METS (pelvic & periaortic nodes)    Reviewed: Allergy & Precautions, H&P , NPO status , Patient's Chart, lab work & pertinent test results  Airway Mallampati: II TM Distance: >3 FB Neck ROM: Full    Dental no notable dental hx.    Pulmonary asthma ,  breath sounds clear to auscultation  Pulmonary exam normal       Cardiovascular negative cardio ROS  Rhythm:Regular Rate:Normal     Neuro/Psych negative neurological ROS  negative psych ROS   GI/Hepatic negative GI ROS, Neg liver ROS,   Endo/Other  negative endocrine ROS  Renal/GU negative Renal ROS  negative genitourinary   Musculoskeletal negative musculoskeletal ROS (+)   Abdominal   Peds negative pediatric ROS (+)  Hematology negative hematology ROS (+)   Anesthesia Other Findings   Reproductive/Obstetrics negative OB ROS                           Anesthesia Physical Anesthesia Plan  ASA: III  Anesthesia Plan: General   Post-op Pain Management:    Induction: Intravenous  Airway Management Planned: LMA  Additional Equipment:   Intra-op Plan:   Post-operative Plan: Extubation in OR  Informed Consent: I have reviewed the patients History and Physical, chart, labs and discussed the procedure including the risks, benefits and alternatives for the proposed anesthesia with the patient or authorized representative who has indicated his/her understanding and acceptance.   Dental advisory given  Plan Discussed with: CRNA and Surgeon  Anesthesia Plan Comments:         Anesthesia Quick Evaluation

## 2013-08-04 NOTE — Discharge Instructions (Signed)
DISCHARGE INSTRUCTIONS FOR KIDNEY STONE/URETERAL STENT   MEDICATIONS:  1.  Resume all your other meds from home - except do not take any extra narcotic pain meds that you may have at home.  2. Trospium is to prevent bladder spasms and help reduce urinary frequency. 3. Pyridium is to help with the burning/stinging when you urinate. 4. Tylenol with codeine is for moderate/severe pain, otherwise taking upto 1000mg  every 6 hours of plainTylenol will help treat your pain.  Do not take both at the same time. 5. Take Cipro one hour prior to removal of your stent.   ACTIVITY:  1. No strenuous activity x 1week  2. No driving while on narcotic pain medications  3. Drink plenty of water  4. Continue to walk at home - you can still get blood clots when you are at home, so keep active, but don't over do it.  5. May return to work/school tomorrow or when you feel ready   BATHING:  1. You can shower and we recommend daily showers  2. You have a string coming from your urethra: The stent string is attached to your ureteral stent. Do not pull on this.   SIGNS/SYMPTOMS TO CALL:  Please call us if you have a fever greater than 101.5, uncontrolled nausea/vomiting, uncontrolled pain, dizziness, unable to urinate, bloody urine, chest pain, shortness of breath, leg swelling, leg pain, redness around wound, drainage from wound, or any other concerns or questions.   You can reach [place] at [phone].   FOLLOW-UP:  1. You will be scheduled for stent exchange in 6 months.  Some one from [facility] will contact you to schedule your appointment.    Post Anesthesia Home Care Instructions  Activity: Get plenty of rest for the remainder of the day. A responsible adult should stay with you for 24 hours following the procedure.  For the next 24 hours, DO NOT: -Drive a car -Paediatric nurse -Drink alcoholic beverages -Take any medication unless instructed by your physician -Make any legal decisions or sign  important papers.  Meals: Start with liquid foods such as gelatin or soup. Progress to regular foods as tolerated. Avoid greasy, spicy, heavy foods. If nausea and/or vomiting occur, drink only clear liquids until the nausea and/or vomiting subsides. Call your physician if vomiting continues.  Special Instructions/Symptoms: Your throat may feel dry or sore from the anesthesia or the breathing tube placed in your throat during surgery. If this causes discomfort, gargle with warm salt water. The discomfort should disappear within 24 hours.

## 2013-08-04 NOTE — OR Nursing (Signed)
Information from the scrub tech, urethra tear after dr. Louis Meckel attemted to insert the cystoscope.

## 2013-08-04 NOTE — H&P (Signed)
63F with invasive poorly differentiated cervical cancer.  She has received both chemotherapy and radiation therapy.  She has associated hydronephrosis from extrinsic malignant obstruction.  She currently has double J stents in place bilaterally in each ureter.  They were last changed May 1st.  She presents today for exchange of her ureteral stents.  Please see Dr. Mariana Kaufman (her primary oncologist) most recent office (07/26/13) note her current regimen and progress towards her treatment.  She has been tolerating her stents reasonably well.    PE Filed Vitals:   08/04/13 1109  BP: 135/93  Pulse: 94  Temp: 96.8 F (36 C)  Resp: 18  NAD RRR CTA-B Abdomen soft    Recent Results (from the past 2160 hour(s))  CBC WITH DIFFERENTIAL     Status: Abnormal   Collection Time    05/08/13 12:49 PM      Result Value Ref Range   WBC 5.9  3.9 - 10.3 10e3/uL   NEUT# 4.5  1.5 - 6.5 10e3/uL   HGB 11.6  11.6 - 15.9 g/dL   HCT 35.4  34.8 - 46.6 %   Platelets 236  145 - 400 10e3/uL   MCV 90.1  79.5 - 101.0 fL   MCH 29.5  25.1 - 34.0 pg   MCHC 32.8  31.5 - 36.0 g/dL   RBC 3.93  3.70 - 5.45 10e6/uL   RDW 18.4 (*) 11.2 - 14.5 %   lymph# 0.4 (*) 0.9 - 3.3 10e3/uL   MONO# 0.4  0.1 - 0.9 10e3/uL   Eosinophils Absolute 0.5  0.0 - 0.5 10e3/uL   Basophils Absolute 0.0  0.0 - 0.1 10e3/uL   NEUT% 77.0 (*) 38.4 - 76.8 %   LYMPH% 6.5 (*) 14.0 - 49.7 %   MONO% 7.5  0.0 - 14.0 %   EOS% 8.7 (*) 0.0 - 7.0 %   BASO% 0.3  0.0 - 2.0 %  BASIC METABOLIC PANEL (BH41)     Status: Abnormal   Collection Time    05/08/13 12:49 PM      Result Value Ref Range   Sodium 140  136 - 145 mEq/L   Potassium 4.8 Sl. Hemolysis  3.5 - 5.1 mEq/L   Chloride 100  98 - 109 mEq/L   CO2 28  22 - 29 mEq/L   Glucose 100  70 - 140 mg/dl   BUN 7.3  7.0 - 26.0 mg/dL   Creatinine 1.0  0.6 - 1.1 mg/dL   Calcium 10.2  8.4 - 10.4 mg/dL   Anion Gap 12 (*) 3 - 11 mEq/L  GLUCOSE, CAPILLARY     Status: None   Collection Time    06/02/13   1:34 PM      Result Value Ref Range   Glucose-Capillary 93  70 - 99 mg/dL  POCT HEMOGLOBIN-HEMACUE     Status: Abnormal   Collection Time    06/16/13  6:50 AM      Result Value Ref Range   Hemoglobin 11.5 (*) 12.0 - 15.0 g/dL  CBC WITH DIFFERENTIAL     Status: Abnormal   Collection Time    07/26/13  1:06 PM      Result Value Ref Range   WBC 7.1  3.9 - 10.3 10e3/uL   NEUT# 5.8  1.5 - 6.5 10e3/uL   HGB 11.6  11.6 - 15.9 g/dL   HCT 35.4  34.8 - 46.6 %   Platelets 293  145 - 400 10e3/uL   MCV 95.5  79.5 -  101.0 fL   MCH 31.3  25.1 - 34.0 pg   MCHC 32.7  31.5 - 36.0 g/dL   RBC 3.71  3.70 - 5.45 10e6/uL   RDW 12.7  11.2 - 14.5 %   lymph# 0.3 (*) 0.9 - 3.3 10e3/uL   MONO# 0.4  0.1 - 0.9 10e3/uL   Eosinophils Absolute 0.6 (*) 0.0 - 0.5 10e3/uL   Basophils Absolute 0.0  0.0 - 0.1 10e3/uL   NEUT% 80.7 (*) 38.4 - 76.8 %   LYMPH% 4.7 (*) 14.0 - 49.7 %   MONO% 5.3  0.0 - 14.0 %   EOS% 8.8 (*) 0.0 - 7.0 %   BASO% 0.5  0.0 - 2.0 %  COMPREHENSIVE METABOLIC PANEL (WE31)     Status: Abnormal   Collection Time    07/26/13  1:06 PM      Result Value Ref Range   Sodium 136  136 - 145 mEq/L   Potassium 4.2  3.5 - 5.1 mEq/L   Chloride 99  98 - 109 mEq/L   CO2 29  22 - 29 mEq/L   Glucose 106  70 - 140 mg/dl   BUN 14.0  7.0 - 26.0 mg/dL   Creatinine 1.5 (*) 0.6 - 1.1 mg/dL   Total Bilirubin 0.21  0.20 - 1.20 mg/dL   Alkaline Phosphatase 82  40 - 150 U/L   AST 11  5 - 34 U/L   ALT <6  0 - 55 U/L   Total Protein 7.3  6.4 - 8.3 g/dL   Albumin 3.1 (*) 3.5 - 5.0 g/dL   Calcium 10.6 (*) 8.4 - 10.4 mg/dL   Anion Gap 9  3 - 11 mEq/L    Urine culture from 07/28/13 from office: 25K Group B strep.  Imp:  Locally advanced cervical cancer with associated hydronephrosis and perhaps slight worsening of her renal function. Plan:  Given on-going local therapy for her cervical cancer we will proceed with stent exchange today.  Will continue to change stents on scheduled interval while under going therapy,  but likely expand the time between.

## 2013-08-04 NOTE — Anesthesia Procedure Notes (Signed)
Procedure Name: LMA Insertion Date/Time: 08/04/2013 1:03 PM Performed by: Mechele Claude Pre-anesthesia Checklist: Patient identified, Emergency Drugs available, Suction available and Patient being monitored Patient Re-evaluated:Patient Re-evaluated prior to inductionOxygen Delivery Method: Circle System Utilized Preoxygenation: Pre-oxygenation with 100% oxygen Intubation Type: IV induction Ventilation: Mask ventilation without difficulty LMA: LMA inserted LMA Size: 4.0 Number of attempts: 1 Airway Equipment and Method: bite block Placement Confirmation: positive ETCO2 Tube secured with: Tape Dental Injury: Teeth and Oropharynx as per pre-operative assessment

## 2013-08-05 NOTE — Anesthesia Postprocedure Evaluation (Signed)
  Anesthesia Post-op Note  Patient: Gina Barnett  Procedure(s) Performed: Procedure(s) (LRB): BILATERAL URETERAL STENT EXCHANGE,BILATERAL RETROGRADE (Bilateral)  Patient Location: PACU  Anesthesia Type: General  Level of Consciousness: awake and alert   Airway and Oxygen Therapy: Patient Spontanous Breathing  Post-op Pain: mild  Post-op Assessment: Post-op Vital signs reviewed, Patient's Cardiovascular Status Stable, Respiratory Function Stable, Patent Airway and No signs of Nausea or vomiting  Last Vitals:  Filed Vitals:   08/04/13 1440  BP: 127/79  Pulse: 75  Temp: 36.3 C  Resp: 16    Post-op Vital Signs: stable    Complications: No apparent anesthesia complications

## 2013-08-07 ENCOUNTER — Encounter (HOSPITAL_BASED_OUTPATIENT_CLINIC_OR_DEPARTMENT_OTHER): Payer: Self-pay | Admitting: Urology

## 2013-08-20 ENCOUNTER — Encounter: Payer: Self-pay | Admitting: Radiation Oncology

## 2013-08-20 NOTE — Progress Notes (Signed)
  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 Some discomfort with insertion of the vaginal cylinder and mild dysuria  Plan: The patient has completed radiation treatment. The patient will return to radiation oncology clinic for routine followup in one month. I advised them to call or return sooner if they have any questions or concerns related to their recovery or treatment.  -----------------------------------  Blair Promise, PhD, MD

## 2013-08-28 ENCOUNTER — Telehealth: Payer: Self-pay

## 2013-08-28 ENCOUNTER — Other Ambulatory Visit: Payer: BC Managed Care – PPO

## 2013-08-28 ENCOUNTER — Encounter: Payer: Self-pay | Admitting: Oncology

## 2013-08-28 ENCOUNTER — Ambulatory Visit: Payer: BC Managed Care – PPO | Admitting: Radiation Oncology

## 2013-08-28 ENCOUNTER — Other Ambulatory Visit: Payer: Self-pay | Admitting: Oncology

## 2013-08-28 DIAGNOSIS — C539 Malignant neoplasm of cervix uteri, unspecified: Secondary | ICD-10-CM

## 2013-08-28 MED ORDER — FENTANYL 75 MCG/HR TD PT72: 75.0000 ug | MEDICATED_PATCH | TRANSDERMAL | Status: DC

## 2013-08-28 NOTE — Telephone Encounter (Signed)
Gina Barnett called for a refill of her Duragesic patches.  She will pick up the prescription tomorrow from the injection nurse prior to Dr. Clabe Seal visit. She is cc of a decreased appetite and increased upper abdominal pain.especially in the last ~2 weeks.  She asked about Marinol for appetite and pain.  Told her that Dr. Marko Plume does not feel that this would be benificial . She is experiencing abdominal discomfort that feels like a band around her stomach and bsck.  It can last for @ hour.  Taking dilaudid is not effective.  Advil is more effective for discomfort.  Suggested is take in crackers and cheese or a food that would coat her stomach more than fruit when taking the advil so her stomach will not get irritated. Told her that Dr. Marko Plume will have the schedulers make an appointment for her to f/U with Dr. Marko Plume on 09-08-13.  Gina Barnett verbalized understanding.

## 2013-08-28 NOTE — Telephone Encounter (Signed)
ENCOUNTER OPENED IN ERROR

## 2013-08-29 ENCOUNTER — Other Ambulatory Visit: Payer: Self-pay | Admitting: Oncology

## 2013-08-29 ENCOUNTER — Ambulatory Visit
Admission: RE | Admit: 2013-08-29 | Discharge: 2013-08-29 | Disposition: A | Discharge status: Home / Self Care | Payer: BC Managed Care – PPO | Source: Ambulatory Visit | Attending: Radiation Oncology | Admitting: Radiation Oncology

## 2013-08-29 ENCOUNTER — Other Ambulatory Visit (HOSPITAL_BASED_OUTPATIENT_CLINIC_OR_DEPARTMENT_OTHER): Payer: BC Managed Care – PPO

## 2013-08-29 ENCOUNTER — Encounter: Payer: Self-pay | Admitting: Radiation Oncology

## 2013-08-29 VITALS — BP 136/87 | HR 98 | Temp 97.9°F | Ht 68.0 in | Wt 151.4 lb

## 2013-08-29 DIAGNOSIS — N135 Crossing vessel and stricture of ureter without hydronephrosis: Secondary | ICD-10-CM

## 2013-08-29 DIAGNOSIS — E860 Dehydration: Secondary | ICD-10-CM

## 2013-08-29 DIAGNOSIS — C539 Malignant neoplasm of cervix uteri, unspecified: Secondary | ICD-10-CM

## 2013-08-29 DIAGNOSIS — C7982 Secondary malignant neoplasm of genital organs: Secondary | ICD-10-CM

## 2013-08-29 LAB — COMPREHENSIVE METABOLIC PANEL (CC13)
ALT: <6 U/L (ref 0–55)
ANION GAP: 9 meq/L (ref 3–11)
AST: 8 U/L (ref 5–34)
Albumin: 3.1 g/dL — ABNORMAL LOW (ref 3.5–5.0)
Alkaline Phosphatase: 87 U/L (ref 40–150)
BILIRUBIN TOTAL: 0.22 mg/dL (ref 0.20–1.20)
BUN: 17.1 mg/dL (ref 7.0–26.0)
CO2: 30 meq/L — AB (ref 22–29)
Calcium: 11.6 mg/dL — ABNORMAL HIGH (ref 8.4–10.4)
Chloride: 96 mEq/L — ABNORMAL LOW (ref 98–109)
Creatinine: 1.4 mg/dL — ABNORMAL HIGH (ref 0.6–1.1)
GLUCOSE: 115 mg/dL (ref 70–140)
Potassium: 5 mEq/L (ref 3.5–5.1)
Sodium: 134 mEq/L — ABNORMAL LOW (ref 136–145)
TOTAL PROTEIN: 8.1 g/dL (ref 6.4–8.3)

## 2013-08-29 MED ORDER — LORAZEPAM 1 MG PO TABS: ORAL_TABLET | ORAL | Status: DC

## 2013-08-29 NOTE — Progress Notes (Signed)
Gina Barnett here for follow up after treatment for cervical cancer.  She reports pain today in her lower abdomen at a 5/10.  She also for the past three weeks has had pain on and off that is aching and feels like a band around her back, ribs and stomach.  She says that it lasts for several hours sometimes.  She reports having nausea and threw up yesterday and this morning.  Her weight is down 10 lbs from 07/26/13.  She was able to drink a protein drink today.  She noticed scant vaginal bleeding last week.  She has also noticed hat the area that Dr. Fermin Schwab removed is back and a little painful.  She reports having stomach cramps when taking milk of magnesia but has bowel movements when taking it.  Her last bowel movement was yesterday morning.  She reports fatigue and weakness.  She is requesting a refill on Ativan.

## 2013-08-29 NOTE — Progress Notes (Signed)
Radiation Oncology         ([phone]) [phone] ________________________________  Name: Gina Barnett MRN: [id number]  Date: 08/29/2013  DOB: 03/04/1955  Follow-Up Visit Note  CC:  Melinda Crutch, MD  Melinda Crutch, MD  Diagnosis:  Stage III-B. squamous cell carcinoma cervix, persistent disease within the vaginal vault  Interval Since Last Radiation:  1  months  Narrative:  The patient returns today for routine follow-up.  She has not been feeling well over the past 4 weeks. She's noticed abdominal pain and a lot of cramping. She has had some nausea and emesis as of yesterday. Patient is lost 10 pounds since her procedure last month. She has had minimal vaginal drainage but no significant bleeding. She has also noticed swelling in the area of surgery along the perineum and is concerned about regrowth in this area. She has been unable to work.                              ALLERGIES:  is allergic to latex and penicillins.  Meds: Current Outpatient Prescriptions  Medication Sig Dispense Refill  . albuterol (PROVENTIL HFA;VENTOLIN HFA) 108 (90 BASE) MCG/ACT inhaler Inhale into the lungs every 6 (six) hours as needed for wheezing or shortness of breath.      . fentaNYL (DURAGESIC - DOSED MCG/HR) 75 MCG/HR Place 1 patch (75 mcg total) onto the skin every 3 (three) days.  10 patch  0  . ibuprofen (ADVIL,MOTRIN) 200 MG tablet Take 200 mg by mouth every 6 (six) hours as needed.      Marland Kitchen LORazepam (ATIVAN) 1 MG tablet Take 1 tablet under tongue or swallow every 6 hours as needed for nausea. Will make drowsy.  30 tablet  0  . magnesium hydroxide (MILK OF MAGNESIA) 400 MG/5ML suspension Take 30 mLs by mouth daily as needed for mild constipation.      . ondansetron (ZOFRAN) 8 MG tablet Take 1-2 tablets (8-16 mg total) by mouth every 12 (twelve) hours as needed for nausea or vomiting.  30 tablet  1  . triamcinolone cream (KENALOG) 0.1 % Apply 1 application topically as needed.      . bisacodyl (DULCOLAX) 5 MG EC  tablet Take 5 mg by mouth daily as needed for moderate constipation.      . Cholecalciferol (VITAMIN D3) 5000 UNITS CAPS Take 1 capsule by mouth daily. Take Monday thru Friday      . clobetasol cream (TEMOVATE) 3.81 % Apply 1 application topically 2 (two) times daily as needed.       . folic acid (FOLVITE) 1 MG tablet Take 1 mg by mouth daily.      Marland Kitchen HYDROmorphone (DILAUDID) 4 MG tablet 1 tablet every 4 hours as needed for pain  30 tablet  0  . vitamin B-12 (CYANOCOBALAMIN) 1000 MCG tablet Take 1,000 mcg by mouth daily.       No current facility-administered medications for this encounter.    Physical Findings: The patient is in no acute distress. Patient is alert and oriented.  height is 5\' 8"  (1.727 m) and weight is 151 lb 6.4 oz (68.675 kg). Her oral temperature is 97.9 F (36.6 C). Her blood pressure is 136/87 and her pulse is 98. .  The lungs are clear. The heart has a regular rhythm and rate. No palpable supraclavicular adenopathy. The abdomen is soft and nontender with normal bowel sounds. No rebound or guarding present. On pelvic  examination the patient has persistent disease in the vaginal vault possibly slightly better,  still significant disease present. Exam with only allow index finger in light of the significantly narrowed vaginal vault. Patient was noted to have some brownish discharge from the vaginal vault slightly blood-tinged.  Patient has a 1 cm area of swelling in the previous surgical site along the perineum. No skin involvement. This appears to be subcutaneous  Lab Findings: Lab Results  Component Value Date   WBC 7.1 07/26/2013   HGB 11.6 07/26/2013   HCT 35.4 07/26/2013   MCV 95.5 07/26/2013   PLT 293 07/26/2013      Radiographic Findings: No results found.  Impression:  Locally advanced cervical cancer with persistent disease the vaginal vault status post radiation therapy. She has had minimal response to her radiation therapy. I discussed this with the patient  today. I am concerned about her abdominal and pelvic pain. Patient likely has progressive tumor along the perineum and would be a candidate for radiation treatments to this area but I would like to rule out more serious issues prior to considering this treatment.  Plan:  CT scan of the chest abdomen and pelvis in the near future. She will have blood work prior  to these studies. Additional management will depend on results of above studies.  ____________________________________ Blair Promise, MD

## 2013-08-30 ENCOUNTER — Telehealth: Payer: Self-pay

## 2013-08-30 ENCOUNTER — Ambulatory Visit (HOSPITAL_BASED_OUTPATIENT_CLINIC_OR_DEPARTMENT_OTHER): Payer: BC Managed Care – PPO

## 2013-08-30 ENCOUNTER — Telehealth: Payer: Self-pay | Admitting: Radiation Oncology

## 2013-08-30 ENCOUNTER — Ambulatory Visit: Payer: BC Managed Care – PPO

## 2013-08-30 VITALS — BP 136/76 | HR 80 | Temp 98.1°F | Resp 14

## 2013-08-30 DIAGNOSIS — Z510 Encounter for antineoplastic radiation therapy: Secondary | ICD-10-CM | POA: Diagnosis not present

## 2013-08-30 DIAGNOSIS — C539 Malignant neoplasm of cervix uteri, unspecified: Secondary | ICD-10-CM

## 2013-08-30 DIAGNOSIS — E860 Dehydration: Secondary | ICD-10-CM

## 2013-08-30 DIAGNOSIS — C7982 Secondary malignant neoplasm of genital organs: Secondary | ICD-10-CM

## 2013-08-30 LAB — CBC WITH DIFFERENTIAL/PLATELET
BASO%: 0.2 % (ref 0.0–2.0)
Basophils Absolute: 0 10*3/uL (ref 0.0–0.1)
EOS%: 3.7 % (ref 0.0–7.0)
Eosinophils Absolute: 0.4 10*3/uL (ref 0.0–0.5)
HCT: 37.6 % (ref 34.8–46.6)
HGB: 12.6 g/dL (ref 11.6–15.9)
LYMPH#: 0.8 10*3/uL — AB (ref 0.9–3.3)
LYMPH%: 7.4 % — ABNORMAL LOW (ref 14.0–49.7)
MCH: 30.4 pg (ref 25.1–34.0)
MCHC: 33.5 g/dL (ref 31.5–36.0)
MCV: 90.6 fL (ref 79.5–101.0)
MONO#: 0.5 10*3/uL (ref 0.1–0.9)
MONO%: 4.8 % (ref 0.0–14.0)
NEUT#: 8.5 10*3/uL — ABNORMAL HIGH (ref 1.5–6.5)
NEUT%: 83.9 % — ABNORMAL HIGH (ref 38.4–76.8)
NRBC: 0 % (ref 0–0)
Platelets: 374 10*3/uL (ref 145–400)
RBC: 4.15 10*6/uL (ref 3.70–5.45)
RDW: 12.9 % (ref 11.2–14.5)
WBC: 10.2 10*3/uL (ref 3.9–10.3)

## 2013-08-30 MED ORDER — ONDANSETRON 8 MG/50ML IVPB (CHCC)
8.0000 mg | Freq: Once | INTRAVENOUS | Status: AC
  Administered 2013-08-30: 8 mg via INTRAVENOUS

## 2013-08-30 MED ORDER — ONDANSETRON 8 MG/NS 50 ML IVPB
INTRAVENOUS | Status: AC
  Filled 2013-08-30: qty 8

## 2013-08-30 MED ORDER — SODIUM CHLORIDE 0.9 % IV SOLN
INTRAVENOUS | Status: DC
  Administered 2013-08-30: 14:00:00 via INTRAVENOUS

## 2013-08-30 NOTE — Telephone Encounter (Signed)
Message copied by Baruch Merl on Wed Aug 30, 2013  8:26 AM ------      Message from: Gordy Levan      Created: Tue Aug 29, 2013  6:47 PM       Chemistries today noted, calcium mildly elevated and creatinine higher. She needs IVF this week, and repeat CTs (unless Dr Sondra Come ordered those on 8-18- not in EMR yet).      POF done for IVF, sent as "urgent". Orders in      RN please let patient know             thanks ------

## 2013-08-30 NOTE — Telephone Encounter (Signed)
Spoke w/Gina Barnett who has scheduled this pt for her CT chest/CT abd. for Friday 09/01/13 @ 2:30p (she will have labwork at 1:30p) NPO 4 hours prior to. I've spoken w/pt and she is agreeable. (lw)

## 2013-08-30 NOTE — Telephone Encounter (Signed)
Spoke with Gina Barnett and told her that Dr. Marko Plume would like her to receive IVF as her calcium are elevated.  She can come in at 1330 for lab and IVF at 1400 today. She is for CT scans at 1430 on Friday 09-01-13.  She needs to arrive at [facility] radiology at 1230 to  Drink the Water based oral contrast.    NPO after 1100 on 09-01-13.

## 2013-08-30 NOTE — Progress Notes (Signed)
Nausea improved but not completely resolved at time of discharge. Patient received 1.5 liters per MD order. VSS. Pt verbalizes she has enough prn anti-emetics at home and understands which meds to take and when. Patient discharged ambulatory in stable condition, no acute distress noted.

## 2013-08-30 NOTE — Patient Instructions (Signed)
Dehydration, Adult Dehydration is when you lose more fluids from the body than you take in. Vital organs like the kidneys, brain, and heart cannot function without a proper amount of fluids and salt. Any loss of fluids from the body can cause dehydration.  CAUSES   Vomiting.  Diarrhea.  Excessive sweating.  Excessive urine output.  Fever. SYMPTOMS  Mild dehydration  Thirst.  Dry lips.  Slightly dry mouth. Moderate dehydration  Very dry mouth.  Sunken eyes.  Skin does not bounce back quickly when lightly pinched and released.  Dark urine and decreased urine production.  Decreased tear production.  Headache. Severe dehydration  Very dry mouth.  Extreme thirst.  Rapid, weak pulse (more than 100 beats per minute at rest).  Cold hands and feet.  Not able to sweat in spite of heat and temperature.  Rapid breathing.  Blue lips.  Confusion and lethargy.  Difficulty being awakened.  Minimal urine production.  No tears. DIAGNOSIS  Your caregiver will diagnose dehydration based on your symptoms and your exam. Blood and urine tests will help confirm the diagnosis. The diagnostic evaluation should also identify the cause of dehydration. TREATMENT  Treatment of mild or moderate dehydration can often be done at home by increasing the amount of fluids that you drink. It is best to drink small amounts of fluid more often. Drinking too much at one time can make vomiting worse. Refer to the home care instructions below. Severe dehydration needs to be treated at the hospital where you will probably be given intravenous (IV) fluids that contain water and electrolytes. HOME CARE INSTRUCTIONS   Ask your caregiver about specific rehydration instructions.  Drink enough fluids to keep your urine clear or pale yellow.  Drink small amounts frequently if you have nausea and vomiting.  Eat as you normally do.  Avoid:  Foods or drinks high in sugar.  Carbonated  drinks.  Juice.  Extremely hot or cold fluids.  Drinks with caffeine.  Fatty, greasy foods.  Alcohol.  Tobacco.  Overeating.  Gelatin desserts.  Wash your hands well to avoid spreading bacteria and viruses.  Only take over-the-counter or prescription medicines for pain, discomfort, or fever as directed by your caregiver.  Ask your caregiver if you should continue all prescribed and over-the-counter medicines.  Keep all follow-up appointments with your caregiver. SEEK MEDICAL CARE IF:  You have abdominal pain and it increases or stays in one area (localizes).  You have a rash, stiff neck, or severe headache.  You are irritable, sleepy, or difficult to awaken.  You are weak, dizzy, or extremely thirsty. SEEK IMMEDIATE MEDICAL CARE IF:   You are unable to keep fluids down or you get worse despite treatment.  You have frequent episodes of vomiting or diarrhea.  You have blood or green matter (bile) in your vomit.  You have blood in your stool or your stool looks black and tarry.  You have not urinated in 6 to 8 hours, or you have only urinated a small amount of very dark urine.  You have a fever.  You faint. MAKE SURE YOU:   Understand these instructions.  Will watch your condition.  Will get help right away if you are not doing well or get worse. Document Released: 12/29/2004 Document Revised: 03/23/2011 Document Reviewed: 08/18/2010 Exit[employer] Patient Information 2015 [employer]. This information is not intended to replace advice given to you by your health care provider. Make sure you discuss any questions you have with your health care   provider.  

## 2013-08-30 NOTE — Progress Notes (Signed)
Notified Dr.Livesay that pt is nauseated and new orders received for Zofran 8mg  IVP

## 2013-09-01 ENCOUNTER — Encounter (HOSPITAL_COMMUNITY): Payer: Self-pay

## 2013-09-01 ENCOUNTER — Ambulatory Visit (HOSPITAL_COMMUNITY): Payer: BC Managed Care – PPO

## 2013-09-01 ENCOUNTER — Telehealth: Payer: Self-pay | Admitting: Oncology

## 2013-09-01 ENCOUNTER — Encounter: Payer: Self-pay | Admitting: Radiation Oncology

## 2013-09-01 ENCOUNTER — Ambulatory Visit (HOSPITAL_COMMUNITY)
Admission: RE | Admit: 2013-09-01 | Discharge: 2013-09-01 | Disposition: A | Discharge status: Home / Self Care | Payer: BC Managed Care – PPO | Source: Ambulatory Visit | Attending: Oncology | Admitting: Oncology

## 2013-09-01 ENCOUNTER — Ambulatory Visit: Payer: BC Managed Care – PPO

## 2013-09-01 DIAGNOSIS — R599 Enlarged lymph nodes, unspecified: Secondary | ICD-10-CM | POA: Insufficient documentation

## 2013-09-01 DIAGNOSIS — K5710 Diverticulosis of small intestine without perforation or abscess without bleeding: Secondary | ICD-10-CM | POA: Diagnosis not present

## 2013-09-01 DIAGNOSIS — C539 Malignant neoplasm of cervix uteri, unspecified: Secondary | ICD-10-CM | POA: Diagnosis not present

## 2013-09-01 MED ORDER — IOHEXOL 300 MG/ML  SOLN
50.0000 mL | Freq: Once | INTRAMUSCULAR | Status: AC | PRN
  Administered 2013-09-01: 50 mL via ORAL

## 2013-09-01 NOTE — Telephone Encounter (Signed)
, °

## 2013-09-01 NOTE — Progress Notes (Signed)
   Department of Radiation Oncology  Phone:  [phone] Fax:        [phone]  I discussed the results of the patient's chest abdomen and pelvic CT scan this afternoon with the patient. The patient understands that she has recurrence within the pelvis area but would not be a good candidate for radiation therapy in light of her previous treatment in this area. The patient could receive treatments to the nodule along the vaginal introitus which was resected and has returned.  At this time the patient is not too symptomatic from this lesion. Patient has been having gastritis type symptoms and after discussion with the radiologist interpreting the CT scan the patient may possibly have x-ray findings of gastritis/duodenitis. I've asked patient to start Prilosec or Zantac this evening. She will be referred to Dr. Herbie Baltimore Buccini whom she has worked with in the past concerning her CT findings in the upper abdominal region.  The patient understands to present to the emergency room this weekend if she develops significant upper abdominal pain nausea and emesis. The patient will be seen by medical oncology next week and the issue of additional chemotherapy can be addressed at that appointment.  Blair Promise, MD

## 2013-09-03 ENCOUNTER — Other Ambulatory Visit: Payer: Self-pay | Admitting: Oncology

## 2013-09-05 ENCOUNTER — Telehealth: Payer: Self-pay | Admitting: *Deleted

## 2013-09-05 NOTE — Telephone Encounter (Signed)
CALLED PATIENT TO INFORM OF APPT. WITH DR. VINCENT SCHOOLER ON 09-12-13, ARRIVAL TIME - 11 AM, SPOKE WITH PATIENT AND SHE IS AWARE OF THIS APPT.

## 2013-09-07 ENCOUNTER — Telehealth: Payer: Self-pay | Admitting: *Deleted

## 2013-09-07 NOTE — Telephone Encounter (Signed)
Called and spoke with patient per Dr. Marko Plume regarding starting taxol and carboplatin chemotherapy. Reviewed with patient side effects of these drugs. Told patient we will give her a copy of the information we reviewed tomorrow at her appointment.  Told patient that IV fluids were scheduled for tomorrow 09/08/13 after she sees Dr. Marko Plume. Per patient, she states that she is feeling better and is able to keep down fluids and protein shakes but would like to keep appointment for IVF in case she doesn't feel well tomorrow. Confirmed patient will be here tomorrow at 1:30pm for appointments.

## 2013-09-08 ENCOUNTER — Telehealth: Payer: Self-pay | Admitting: Oncology

## 2013-09-08 ENCOUNTER — Telehealth: Payer: Self-pay | Admitting: *Deleted

## 2013-09-08 ENCOUNTER — Other Ambulatory Visit: Payer: Self-pay | Admitting: *Deleted

## 2013-09-08 ENCOUNTER — Other Ambulatory Visit (HOSPITAL_BASED_OUTPATIENT_CLINIC_OR_DEPARTMENT_OTHER): Payer: BC Managed Care – PPO

## 2013-09-08 ENCOUNTER — Encounter: Payer: Self-pay | Admitting: Oncology

## 2013-09-08 ENCOUNTER — Ambulatory Visit: Payer: BC Managed Care – PPO

## 2013-09-08 ENCOUNTER — Ambulatory Visit (HOSPITAL_BASED_OUTPATIENT_CLINIC_OR_DEPARTMENT_OTHER): Payer: BC Managed Care – PPO | Admitting: Oncology

## 2013-09-08 VITALS — BP 105/69 | HR 92 | Temp 98.2°F | Resp 18 | Ht 68.0 in | Wt 154.2 lb

## 2013-09-08 DIAGNOSIS — R634 Abnormal weight loss: Secondary | ICD-10-CM

## 2013-09-08 DIAGNOSIS — T451X5A Adverse effect of antineoplastic and immunosuppressive drugs, initial encounter: Secondary | ICD-10-CM

## 2013-09-08 DIAGNOSIS — C539 Malignant neoplasm of cervix uteri, unspecified: Secondary | ICD-10-CM

## 2013-09-08 DIAGNOSIS — D500 Iron deficiency anemia secondary to blood loss (chronic): Secondary | ICD-10-CM

## 2013-09-08 DIAGNOSIS — D6481 Anemia due to antineoplastic chemotherapy: Secondary | ICD-10-CM

## 2013-09-08 DIAGNOSIS — C7982 Secondary malignant neoplasm of genital organs: Secondary | ICD-10-CM

## 2013-09-08 DIAGNOSIS — D630 Anemia in neoplastic disease: Secondary | ICD-10-CM

## 2013-09-08 DIAGNOSIS — N1330 Unspecified hydronephrosis: Secondary | ICD-10-CM

## 2013-09-08 DIAGNOSIS — N135 Crossing vessel and stricture of ureter without hydronephrosis: Secondary | ICD-10-CM

## 2013-09-08 LAB — CBC WITH DIFFERENTIAL/PLATELET
BASO%: 0.1 % (ref 0.0–2.0)
BASOS ABS: 0 10*3/uL (ref 0.0–0.1)
EOS ABS: 0.5 10*3/uL (ref 0.0–0.5)
EOS%: 5.2 % (ref 0.0–7.0)
HCT: 32.4 % — ABNORMAL LOW (ref 34.8–46.6)
HEMOGLOBIN: 10.5 g/dL — AB (ref 11.6–15.9)
LYMPH%: 4.9 % — ABNORMAL LOW (ref 14.0–49.7)
MCH: 29.7 pg (ref 25.1–34.0)
MCHC: 32.4 g/dL (ref 31.5–36.0)
MCV: 91.8 fL (ref 79.5–101.0)
MONO#: 0.5 10*3/uL (ref 0.1–0.9)
MONO%: 4.7 % (ref 0.0–14.0)
NEUT%: 85.1 % — ABNORMAL HIGH (ref 38.4–76.8)
NEUTROS ABS: 8.5 10*3/uL — AB (ref 1.5–6.5)
Platelets: 312 10*3/uL (ref 145–400)
RBC: 3.53 10*6/uL — ABNORMAL LOW (ref 3.70–5.45)
RDW: 13.3 % (ref 11.2–14.5)
WBC: 9.9 10*3/uL (ref 3.9–10.3)
lymph#: 0.5 10*3/uL — ABNORMAL LOW (ref 0.9–3.3)

## 2013-09-08 LAB — COMPREHENSIVE METABOLIC PANEL (CC13)
ALBUMIN: 2.8 g/dL — AB (ref 3.5–5.0)
ALT: 7 U/L (ref 0–55)
AST: 9 U/L (ref 5–34)
Alkaline Phosphatase: 80 U/L (ref 40–150)
Anion Gap: 8 mEq/L (ref 3–11)
BUN: 15.3 mg/dL (ref 7.0–26.0)
CALCIUM: 9.6 mg/dL (ref 8.4–10.4)
CO2: 27 mEq/L (ref 22–29)
Chloride: 97 mEq/L — ABNORMAL LOW (ref 98–109)
Creatinine: 1.1 mg/dL (ref 0.6–1.1)
Glucose: 112 mg/dl (ref 70–140)
Potassium: 3.9 mEq/L (ref 3.5–5.1)
Sodium: 132 mEq/L — ABNORMAL LOW (ref 136–145)
TOTAL PROTEIN: 7.2 g/dL (ref 6.4–8.3)
Total Bilirubin: 0.37 mg/dL (ref 0.20–1.20)

## 2013-09-08 MED ORDER — DEXAMETHASONE 4 MG PO TABS: ORAL_TABLET | ORAL | Status: DC

## 2013-09-08 MED ORDER — LORAZEPAM 1 MG PO TABS: ORAL_TABLET | ORAL | Status: DC

## 2013-09-08 MED ORDER — PANTOPRAZOLE SODIUM 40 MG PO TBEC: 40.0000 mg | DELAYED_RELEASE_TABLET | Freq: Every day | ORAL | Status: DC

## 2013-09-08 NOTE — Telephone Encounter (Signed)
Per staff message and POF I have scheduled appts. Advised scheduler of appts. JMW  

## 2013-09-08 NOTE — Progress Notes (Signed)
OFFICE PROGRESS NOTE   09/08/2013   Physicians:D.ClarkePearson,J.Kinard, C.Melinda Crutch (PCP), [facility], [facility]rology), Ladona Horns. New patient appointment to V.Schooler 09-12-13.    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 She had CT CAP 09-01-13 which showed progression at vaginal introitus and new bilateral perirectal nodules, new duodenal wall thickening and stable RML pulmonary nodule and likely hepatic cyst. Dr Sondra Come has reviewed CT and discussed with patient. As she  had 2 weeks of upper abdominal pain with nausea and dry heaves, Dr Sondra Come referred to Dr Michail Sermon and began OTC prilosec on 09-01-13. Patient had needed IVF and IV antiemetics at [facility] on 08-30-13. Patient had been unable to eat for 1-2 weeks due to severity of GI symptoms, however these have improved with prilosec 20 mg daily since 09-01-13. She has eaten pasta and is drinking fluids now. She has had vaginal spotting x 5 days, no other bleeding, no dysuria and bowels are moving daily.    She does not have PAC. She has JJ stents, changed 08-04-13, due next change Oct.  The housemate for whom Ms.Lusby was primary caregiver prior to illness died recently.   ONCOLOGIC HISTORY Patient presented with heavy vaginal bleeding, with low back pain and right hip pain. She was seen by Dr Dellis Filbert, with biopsies from upper vagina and lower anterior vagina showing invasive squamous cell carcinoma. 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 She saw Dr Josephina Shih 01-27-13, with cervix replaced by fungating tumor extending to both pelvic sidewalls and nodular lesion halfway down anterior vaginal wall. Right hip xray 01-27-13 showed no bony mets. PET 02-10-13 at [facility] had new right hydronephrosis in addition to the previous left hydronephrosis, uptake in para-aortic nodes, left iliac nodes, bulky cervical mass, adenopathy adjacent to rectum; lungs and bones ok by PET. Bilateral JJ stents were placed 02-17-12. First CDDP was given 02-20-13. Chemo held 2-16 with acute pain and inadequate hydration; transfused 1 unit PRBCs 2-16 for Hgb 9.6. She had second CDDP on 03-10-13, then chemo held following week due to low counts. External beam RT was given 02-13-13 thru 03-17-13, 45 gray to pelvis and periaortics. She had progression in vagina and vulvar area by 06-2013, with brachytherapy given thru 07-25-13.  Review of systems as above, also: No fever or symptoms of infection. Denies SOB. Is usually able to sleep. No LE swelling. Remainder of 10 point Review of Systems negative.  Objective:  Vital signs in last 24 hours:  BP 105/69  Pulse 92  Temp(Src) 98.2 F (36.8 C) (Oral)  Resp 18  Ht 5\' 8"  (1.727 m)  Wt 154 lb 3.2 oz (69.945 kg)  BMI 23.45 kg/m2  LMP 03/11/2013 weight is down 7 lbs from mid July.  Alert, oriented and appropriate. Ambulatory without difficulty, able to get on and off exam table without assistance  Alopecia  HEENT:PERRL, sclerae not icteric. Oral mucosa moist without lesions, posterior pharynx clear.  Neck supple. No JVD.  Lymphatics:no cervical,suraclavicular, axillary or inguinal adenopathy Resp: clear to auscultation bilaterally and normal percussion bilaterally Cardio: regular rate and rhythm.  No gallop. GI: soft, slightly tender in epigastrium, not distended, no mass or organomegaly. Normally active bowel sounds.  Mobile 1 cm firm nodule just above pubic  symphysis, not tender.  Musculoskeletal/ Extremities: without pitting edema, cords, tenderness Neuro: no peripheral neuropathy. Otherwise nonfocal. PSYCH normal mood and affect Skin without rash, ecchymosis, petechiae  Lab Results:  Results for orders placed in visit on 09/08/13  CBC WITH DIFFERENTIAL      Result Value Ref Range   WBC 9.9  3.9 - 10.3 10e3/uL   NEUT# 8.5 (*) 1.5 - 6.5 10e3/uL   HGB 10.5 (*) 11.6 - 15.9 g/dL   HCT 32.4 (*) 34.8 - 46.6 %   Platelets 312  145 - 400 10e3/uL   MCV 91.8  79.5 - 101.0 fL   MCH 29.7  25.1 - 34.0 pg   MCHC 32.4  31.5 - 36.0 g/dL   RBC 3.53 (*) 3.70 - 5.45 10e6/uL   RDW 13.3  11.2 - 14.5 %   lymph# 0.5 (*) 0.9 - 3.3 10e3/uL   MONO# 0.5  0.1 - 0.9 10e3/uL   Eosinophils Absolute 0.5  0.0 - 0.5 10e3/uL   Basophils Absolute 0.0  0.0 - 0.1 10e3/uL   NEUT% 85.1 (*) 38.4 - 76.8 %   LYMPH% 4.9 (*) 14.0 - 49.7 %   MONO% 4.7  0.0 - 14.0 %   EOS% 5.2  0.0 - 7.0 %   BASO% 0.1  0.0 - 2.0 %  COMPREHENSIVE METABOLIC PANEL (JM42)      Result Value Ref Range   Sodium 132 (*) 136 - 145 mEq/L   Potassium 3.9  3.5 - 5.1 mEq/L   Chloride 97 (*) 98 - 109 mEq/L   CO2 27  22 - 29 mEq/L   Glucose 112  70 - 140 mg/dl   BUN 15.3  7.0 - 26.0 mg/dL   Creatinine 1.1  0.6 - 1.1 mg/dL   Total Bilirubin 0.37  0.20 - 1.20 mg/dL   Alkaline Phosphatase 80  40 - 150 U/L   AST 9  5 - 34 U/L   ALT 7  0 - 55 U/L   Total Protein 7.2  6.4 - 8.3 g/dL   Albumin 2.8 (*) 3.5 - 5.0 g/dL   Calcium 9.6  8.4 - 10.4 mg/dL   Anion Gap 8  3 - 11 mEq/L     Studies/Results: CT CHEST, ABDOMEN AND PELVIS WITHOUT CONTRAST  TECHNIQUE:  Multidetector CT imaging of the chest, abdomen and pelvis was  performed following the standard protocol without IV contrast.  COMPARISON: PET-CT from 06/02/2013. CT scan from 05/05/2013.  FINDINGS:  CT CHEST FINDINGS  Soft tissue / Mediastinum: There is no axillary lymphadenopathy. No  mediastinal or hilar lymphadenopathy. Heart size is normal.  There is  no pericardial effusion.  Lungs / Pleura: 4 mm right middle lobe nodule (image 31 series 4) is  unchanged. Lungs are otherwise clear without other parenchymal  nodule or mass. No focal airspace consolidation or pulmonary edema.  Bones: Bone windows reveal no worrisome lytic or sclerotic osseous  lesions.  CT ABDOMEN AND PELVIS FINDINGS  Liver: Stable appearance of the 10 mm low-density lesion in the  posterior right liver. A second very tiny lesions seen on the  previous CT scan and as subcapsular hepatic dome is not evident on  today's study, likely secondary to lack of intravenous contrast  material.  Spleen: No splenomegaly. No focal mass lesion.  Stomach: Nondistended. No gastric wall thickening.  No evidence of  outlet obstruction.  Pancreas: No focal mass lesion. No dilatation of the main duct. No  intraparenchymal cyst. No peripancreatic edema.  Gallbladder/Biliary: No evidence for gallstones. No pericholecystic  fluid. No intrahepatic or extrahepatic biliary dilation.  Kidneys/Adrenals: No adrenal nodule or mass. Bilateral internal  ureteral stents are again noted and appears stable in position.  Bowel Loops: There as some edema and/or inflammation in the region  of the duodenum, pancreatic head, and hepatoduodenal ligament. There  appears to be some circumferential wall thickening in the first and  second portions of the duodenum. 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 There is no evidence for focal fluid  collection. No extraluminal gas.  No small bowel dilatation to suggest small bowel obstruction.  Terminal ileum is normal. The appendix is not visualized, but there  is no edema or inflammation in the region of the cecum. Diverticular  changes are noted in the distal colon without diverticulitis  Nodes: There is no retroperitoneal lymphadenopathy in the abdomen no   pelvic sidewall lymphadenopathy. There is a new 1.7 x 1.5 cm left  perirectal lymph node seen on image 104 of series 2. Right-sided  perirectal nodule on image 101 measures 1.3 x 2.0 cm  Vasculature: No abdominal aortic aneurysm.  Pelvic Genitourinary: Fibroid change enlarges the uterus. There is  no evidence for adnexal mass. Bladder is decompressed. The  hypermetabolic nodules seen along the cranial aspect of the vaginal  introitus has increased in size in the interval, measuring 1.9 x 2.1  cm today compared to 0.8 x 0.7 cm previously.  Bones/Musculoskeletal: Bone windows reveal no worrisome lytic or  sclerotic osseous lesions.  Body Wall: No evidence for abdominal wall hernia.  Other: Persistent edema/ inflammation in the soft tissues of the  pelvic floor. No intraperitoneal free fluid.  IMPRESSION:  1. Interval development of duodenal wall thickening and some subtle  periduodenal edema/inflammation. This may be related to distal  gastritis/duodenitis. There is a diverticulum in the duodenum and  duodenum diverticulitis would also be a consideration. Pancreatitis  involving the head of the pancreas could also have this appearance.  No evidence for gastric outlet obstruction at this time.  2. Interval progression of the hypermetabolic nodule seen along the  cranial aspect of the vaginal introitus on the recent PET-CT. This  is consistent with metastatic progression. The patient also has new  perirectal soft tissue nodules, likely metastatic lymph nodes,  bilaterally.  3. Stable right middle lobe pulmonary nodule.  4. Stable low-density liver lesion, likely a cyst.   Medications: I have reviewed the patient's current medications. Change OTC Prilosec to Protonix 40 mg daily. Scripts for premed decadron 20 mg 12 hrs and 6 hrs prior to taxol and for zofran and ativan sent to pharmacy. Discussed GI irritation from NSAID and steroids.  DISCUSSION: reviewed information from recent CT and  fact that she has had maximal radiation to pelvis, tho she could have some additional directed radiation to nodule at vaginal introitus if this becomes symptomatic. Discussed next line treatment would be with chemotherapy, with carbo taxol or could consider taxol CDDP +/- avastin. She had teaching for [place] taxol by RN and is in agreement with trying this. (Not discussed, but she may be candidate for Nivolumab study after carbo taxol). All questions answered. She will need careful dosing due to previous pelvic RT, but will try  with q 3 week schedule. Prefers midday treatment.  Assessment/Plan: 1.progressive metastatic squamous cell carcinoma of cervix with bilateral perirectal, vaginal and likely suprapubic involvement: limited further radiation possible. Will try carbo taxol q 3 weeks. I will see her back ~ a week after first treatment. RN will review decadron doses when chemo date/ time set. 2.symptoms consistent with gastritis or duodenitis as suggested by CT: has been very symptomatic for past 2 weeks, but can tell some improvement with just OTC strength prilosec. Will change to Protonix 40 mg daily. She will let me know Dr Kathline Magic recommendations after visit 9-1, as we may need to delay start of chemo if he recommends upper endoscopy etc. She also understands that oral steroids needed for taxol can cause GI irritation. 3.JJ stents for obstruction from cervical cancer. Recently changed, tolerating well 4.weight loss and poor nutritional status in setting of chronic illness: encouraged supplements now 5.anemia related to blood loss, pelvic RT, previous chemo, chronic illness. 6.Living Will and HCPOA in place   Consent obtained. Prior auth requested. Antiemetics as above. Chemo orders entered, tho date may need to change. CC this note Dr Michail Sermon for new patient apt 09-12-13. Time spent 45 min including >50% counseling and coordination of care.   Traevion Poehler P, MD   09/08/2013, 3:32 PM

## 2013-09-08 NOTE — Telephone Encounter (Signed)
sch pt appt-gave pt copy of sch-senr email to MW to sch trmt

## 2013-09-12 ENCOUNTER — Telehealth: Payer: Self-pay

## 2013-09-12 NOTE — Telephone Encounter (Signed)
Gina Barnett wanted Dr. Marko Plume to know that Dr. Michail Sermon is going to do an endoscopy on Thursday  09-14-13.  She will request that Dr. Marko Plume  Have a report sent to her.

## 2013-09-13 ENCOUNTER — Telehealth: Payer: Self-pay | Admitting: Oncology

## 2013-09-13 NOTE — Telephone Encounter (Signed)
cld & spoke to pt to adv of time & date of trmt-pt understood

## 2013-09-14 ENCOUNTER — Telehealth: Payer: Self-pay | Admitting: *Deleted

## 2013-09-14 ENCOUNTER — Other Ambulatory Visit: Payer: Self-pay | Admitting: Gastroenterology

## 2013-09-14 NOTE — Telephone Encounter (Signed)
Patient's friend, Leota Sauers called and left voicemail stating AmeLie had endoscopy today which showed large ulcer. Stated pt is still having a lot of lower abdominal pain. Spoke with Dr. Marko Plume and she suggests patient try dilaudid tablets or if she would like something milder she could try tramadol or oxycodone. Called Amiee back who was with patient at the time and she stated Dr. Michail Sermon was going to increase protonix to 40mg  BID so hopefully that will help with the ulcer and that Regla will try dilaudid tablets again. Told Amiee to call us back tomorrow if they are not effective. She was agreeable to this.

## 2013-09-15 ENCOUNTER — Telehealth: Payer: Self-pay

## 2013-09-15 NOTE — Telephone Encounter (Signed)
Dr.  Michail Sermon will be sending a procedure note to Dr. Marko Plume but wanted Dr. Marko Plume be aware of his findings in light of her chemotherapy treatment on 09-22-13. He stated that he found a large duodenal ulcer-cratered but not bleeding.  It has a high risk of bleeding. Ie stopped her NSAID's and increased her PPI  Protonix to 40 mg bid. Path: Negative biopsies  from around the ulcer. Dr. Marko Plume given the information to review.

## 2013-09-15 NOTE — Telephone Encounter (Signed)
Spoke with Ms. Sigmund regarding her pain level Increasing to 7-8/10 while she is moving around.  She states that one  4 mg dilaudid not not effective.  Told her that Dr. Marko Plume said that she coul take 1-2 tabs of dilaudid 4 mg every 4 hours as needed.  She needs to increase her laxatives accordingly. Ms. Wahlert states that she feels that the lump on her Mons pubis (Where BX done) is getting larger and may be contributing to her abdominal pain. She is not sure that she should take the decadron prior to treatment 09-22-13 with the ulcer and that Dr. Marko Plume may not want to begin the chemotherapy with Ucler. Told Ms. Yanda that this nurse would sent Dr. Marko Plume a message and she should call the office Wednesday 09-20-13 if she has not heard a response from Dr. Mariana Kaufman office. Patient verbalized understanding.

## 2013-09-18 ENCOUNTER — Other Ambulatory Visit: Payer: Self-pay | Admitting: Oncology

## 2013-09-18 DIAGNOSIS — C539 Malignant neoplasm of cervix uteri, unspecified: Secondary | ICD-10-CM

## 2013-09-18 DIAGNOSIS — D500 Iron deficiency anemia secondary to blood loss (chronic): Secondary | ICD-10-CM

## 2013-09-19 ENCOUNTER — Telehealth: Payer: Self-pay | Admitting: *Deleted

## 2013-09-19 NOTE — Telephone Encounter (Signed)
Called patient to see how her pain is and if she able to eat more now since increasing her protonix to BID. Per patient, she is tolerating taking the dilaudid tablets and her pain decreases to 4/10 when she takes the medicine. Patient states that is tolerable for her. Also states she is able to keep food down and is doing high protein food in small amounts throughout the day. Told patient we will cancel her chemo for 09/22/13 but she will have lab and see Dr. Marko Plume still. Patient agreeable to this.  Gina Barnett            Please check on her by phone on 9-8 or 9-9, how is pain, how are symptoms from the ulcer, is she able to eat?  Please let her know that I still want to see her on 9-11, but think we should probably delay chemo a week at least, as I prefer no steroids until that ulcer has a chance to improve.  Please cancel chemo 9-11, and I will reschedule it after I see her that day.   Thanks!

## 2013-09-22 ENCOUNTER — Ambulatory Visit (HOSPITAL_BASED_OUTPATIENT_CLINIC_OR_DEPARTMENT_OTHER): Payer: BC Managed Care – PPO | Admitting: Oncology

## 2013-09-22 ENCOUNTER — Telehealth: Payer: Self-pay | Admitting: Oncology

## 2013-09-22 ENCOUNTER — Encounter: Payer: Self-pay | Admitting: Oncology

## 2013-09-22 ENCOUNTER — Telehealth: Payer: Self-pay | Admitting: *Deleted

## 2013-09-22 ENCOUNTER — Ambulatory Visit: Payer: BC Managed Care – PPO

## 2013-09-22 ENCOUNTER — Other Ambulatory Visit (HOSPITAL_BASED_OUTPATIENT_CLINIC_OR_DEPARTMENT_OTHER): Payer: BC Managed Care – PPO

## 2013-09-22 VITALS — BP 114/70 | HR 96 | Temp 98.4°F | Resp 18 | Ht 68.0 in | Wt 152.3 lb

## 2013-09-22 DIAGNOSIS — D500 Iron deficiency anemia secondary to blood loss (chronic): Secondary | ICD-10-CM

## 2013-09-22 DIAGNOSIS — T451X5A Adverse effect of antineoplastic and immunosuppressive drugs, initial encounter: Secondary | ICD-10-CM

## 2013-09-22 DIAGNOSIS — C539 Malignant neoplasm of cervix uteri, unspecified: Secondary | ICD-10-CM

## 2013-09-22 DIAGNOSIS — D630 Anemia in neoplastic disease: Secondary | ICD-10-CM

## 2013-09-22 DIAGNOSIS — D6481 Anemia due to antineoplastic chemotherapy: Secondary | ICD-10-CM

## 2013-09-22 DIAGNOSIS — Z23 Encounter for immunization: Secondary | ICD-10-CM

## 2013-09-22 LAB — COMPREHENSIVE METABOLIC PANEL (CC13)
ALBUMIN: 2.7 g/dL — AB (ref 3.5–5.0)
ALK PHOS: 69 U/L (ref 40–150)
AST: 9 U/L (ref 5–34)
Anion Gap: 10 mEq/L (ref 3–11)
BILIRUBIN TOTAL: 0.36 mg/dL (ref 0.20–1.20)
BUN: 16.3 mg/dL (ref 7.0–26.0)
CO2: 28 mEq/L (ref 22–29)
Calcium: 9.9 mg/dL (ref 8.4–10.4)
Chloride: 98 mEq/L (ref 98–109)
Creatinine: 1.3 mg/dL — ABNORMAL HIGH (ref 0.6–1.1)
GLUCOSE: 149 mg/dL — AB (ref 70–140)
POTASSIUM: 3.6 meq/L (ref 3.5–5.1)
SODIUM: 136 meq/L (ref 136–145)
TOTAL PROTEIN: 7.3 g/dL (ref 6.4–8.3)

## 2013-09-22 LAB — IRON AND TIBC CHCC
%SAT: 11 % — AB (ref 21–57)
Iron: 25 ug/dL — ABNORMAL LOW (ref 41–142)
TIBC: 229 ug/dL — ABNORMAL LOW (ref 236–444)
UIBC: 203 ug/dL (ref 120–384)

## 2013-09-22 LAB — CBC WITH DIFFERENTIAL/PLATELET
BASO%: 0.5 % (ref 0.0–2.0)
Basophils Absolute: 0 10*3/uL (ref 0.0–0.1)
EOS%: 2.4 % (ref 0.0–7.0)
Eosinophils Absolute: 0.3 10*3/uL (ref 0.0–0.5)
HCT: 30.4 % — ABNORMAL LOW (ref 34.8–46.6)
HGB: 9.8 g/dL — ABNORMAL LOW (ref 11.6–15.9)
LYMPH%: 3.5 % — ABNORMAL LOW (ref 14.0–49.7)
MCH: 29.2 pg (ref 25.1–34.0)
MCHC: 32.1 g/dL (ref 31.5–36.0)
MCV: 90.7 fL (ref 79.5–101.0)
MONO#: 0.5 10*3/uL (ref 0.1–0.9)
MONO%: 4.6 % (ref 0.0–14.0)
NEUT%: 89 % — ABNORMAL HIGH (ref 38.4–76.8)
NEUTROS ABS: 9.6 10*3/uL — AB (ref 1.5–6.5)
Platelets: 377 10*3/uL (ref 145–400)
RBC: 3.35 10*6/uL — AB (ref 3.70–5.45)
RDW: 14 % (ref 11.2–14.5)
WBC: 10.7 10*3/uL — ABNORMAL HIGH (ref 3.9–10.3)
lymph#: 0.4 10*3/uL — ABNORMAL LOW (ref 0.9–3.3)

## 2013-09-22 LAB — FERRITIN CHCC: FERRITIN: 223 ng/mL (ref 9–269)

## 2013-09-22 MED ORDER — HYDROMORPHONE HCL 4 MG PO TABS: ORAL_TABLET | ORAL | Status: DC

## 2013-09-22 MED ORDER — INFLUENZA VAC SPLIT QUAD 0.5 ML IM SUSY
0.5000 mL | PREFILLED_SYRINGE | Freq: Once | INTRAMUSCULAR | Status: AC
  Administered 2013-09-22: 0.5 mL via INTRAMUSCULAR
  Filled 2013-09-22: qty 0.5

## 2013-09-22 MED ORDER — PANTOPRAZOLE SODIUM 40 MG PO TBEC: 40.0000 mg | DELAYED_RELEASE_TABLET | Freq: Two times a day (BID) | ORAL | Status: DC

## 2013-09-22 MED ORDER — FENTANYL 25 MCG/HR TD PT72: MEDICATED_PATCH | TRANSDERMAL | Status: DC

## 2013-09-22 MED ORDER — FERROUS FUMARATE 325 (106 FE) MG PO TABS: 1.0000 | ORAL_TABLET | Freq: Every day | ORAL | Status: DC

## 2013-09-22 NOTE — Telephone Encounter (Signed)
pe pof to sch pt appt-pt wanted MD appts & labs/trmts same day-per Ll ok-sch Dr Edgar Frisk has copy of sch

## 2013-09-22 NOTE — Progress Notes (Signed)
OFFICE PROGRESS NOTE   09/22/2013   Physicians:D.ClarkePearson,J.Kinard, C.Melinda Crutch (PCP), [facility], [facility]), Ladona Horns. V.Schooler    INTERVAL HISTORY:  Patient is seen, alone for visit, in continuing attention to progressive metastatic squamous cell carcinoma of cervix. Planned start of palliative chemotherapy has been delayed due to finding of large duodenal ulcer at upper endoscopy by Dr Michail Sermon on 09-15-13. This deep ulcer was not actively bleeding, however was clearly at risk for bleeding.  Severe epigastric pain and vomiting is much better on Protonix 40 mg bid, however she continues to have low abdominal and pelvic pain despite duragesic 75 mcg and dilaudid 4-8 mg which she is using only ~ 2x daily.She stopped NSAIDs with finding of the ulcer. She can tell increase in size of tender mass over pubis and has vaginal pain. She is not clear about return appointment to Dr Michail Sermon possibly in ~ 3 weeks.  Patient alternates between no bowel movements for a few days and multiple episodes of diarrhea. She is using miralax and dulcolax tablets, last BM ~ 2 days ago and feels uncomfortable with this now. She has slight vaginal spotting. She sleeps poorly, uncomfortable and alone at home.  She does not have PAC. JJ stents are due change ~ Oct. She had flu vaccine today.    ONCOLOGIC HISTORY Patient presented with heavy vaginal bleeding, with low back pain and right hip pain. She was seen by Dr Dellis Filbert, with biopsies from upper vagina and lower anterior vagina showing invasive squamous cell carcinoma. 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 She saw Dr Josephina Shih 01-27-13, with  cervix replaced by fungating tumor extending to both pelvic sidewalls and nodular lesion halfway down anterior vaginal wall. Right hip xray 01-27-13 showed no bony mets. PET 02-10-13 at [facility] had new right hydronephrosis in addition to the previous left hydronephrosis, uptake in para-aortic nodes, left iliac nodes, bulky cervical mass, adenopathy adjacent to rectum; lungs and bones ok by PET. Bilateral JJ stents were placed 02-17-12. First CDDP was given 02-20-13. Chemo held 2-16 with acute pain and inadequate hydration; transfused 1 unit PRBCs 2-16 for Hgb 9.6. She had second CDDP on 03-10-13, then chemo held following week due to low counts. External beam RT was given 02-13-13 thru 03-17-13, 45 gray to pelvis and periaortics. She had progression in vagina and vulvar area by 06-2013, with brachytherapy given thru 07-25-13.  Review of systems as above, also: No fever or symptoms of infection. No hematuria. No increased SOB.Drinking ~ 2 protein shakes daily, gatorade and water. Remainder of 10 point Review of Systems negative.  Objective:  Vital signs in last 24 hours:  BP 114/70  Pulse 96  Temp(Src) 98.4 F (36.9 C) (Oral)  Resp 18  Ht 5\' 8"  (1.727 m)  Wt 152 lb 4.8 oz (69.083 kg)  BMI 23.16 kg/m2  LMP 03/11/2013 Weight down 2 lbs in last 2 weeks. Alert, oriented and appropriate. Ambulatory without assistance.  No alopecia  HEENT:PERRL, sclerae not icteric. Oral mucosa a little dry without lesions, posterior pharynx clear.  Neck supple. No JVD.  Lymphatics:no cervical,suraclavicular adenopathy Resp: clear to auscultation bilaterally and normal percussion bilaterally Cardio: regular rate and rhythm. No gallop. GI: soft, not tender now at epigastrium, minimally distended, no clear mass  or organomegaly. A few bowel sounds. Mass over pubis ~ 3 cm diameter, not fixed, somewhat tender Musculoskeletal/ Extremities: without pitting edema, cords, tenderness Neuro: no peripheral neuropathy. Otherwise  nonfocal. PSYCH appropriate mood and affect Skin without rash, ecchymosis, petechiae   Lab Results:  Results for orders placed in visit on 09/22/13  CBC WITH DIFFERENTIAL      Result Value Ref Range   WBC 10.7 (*) 3.9 - 10.3 10e3/uL   NEUT# 9.6 (*) 1.5 - 6.5 10e3/uL   HGB 9.8 (*) 11.6 - 15.9 g/dL   HCT 30.4 (*) 34.8 - 46.6 %   Platelets 377  145 - 400 10e3/uL   MCV 90.7  79.5 - 101.0 fL   MCH 29.2  25.1 - 34.0 pg   MCHC 32.1  31.5 - 36.0 g/dL   RBC 3.35 (*) 3.70 - 5.45 10e6/uL   RDW 14.0  11.2 - 14.5 %   lymph# 0.4 (*) 0.9 - 3.3 10e3/uL   MONO# 0.5  0.1 - 0.9 10e3/uL   Eosinophils Absolute 0.3  0.0 - 0.5 10e3/uL   Basophils Absolute 0.0  0.0 - 0.1 10e3/uL   NEUT% 89.0 (*) 38.4 - 76.8 %   LYMPH% 3.5 (*) 14.0 - 49.7 %   MONO% 4.6  0.0 - 14.0 %   EOS% 2.4  0.0 - 7.0 %   BASO% 0.5  0.0 - 2.0 %  COMPREHENSIVE METABOLIC PANEL (WK46)      Result Value Ref Range   Sodium 136  136 - 145 mEq/L   Potassium 3.6  3.5 - 5.1 mEq/L   Chloride 98  98 - 109 mEq/L   CO2 28  22 - 29 mEq/L   Glucose 149 (*) 70 - 140 mg/dl   BUN 16.3  7.0 - 26.0 mg/dL   Creatinine 1.3 (*) 0.6 - 1.1 mg/dL   Total Bilirubin 0.36  0.20 - 1.20 mg/dL   Alkaline Phosphatase 69  40 - 150 U/L   AST 9  5 - 34 U/L   ALT <6  0 - 55 U/L   Total Protein 7.3  6.4 - 8.3 g/dL   Albumin 2.7 (*) 3.5 - 5.0 g/dL   Calcium 9.9  8.4 - 10.4 mg/dL   Anion Gap 10  3 - 11 mEq/L    Serum iron available after visit 25, %sat 11  Studies/Results:  No results found.  Medications: I have reviewed the patient's current medications. Will increase duragesic to 100 mcg every 72 hrs (as 25 mcg + 75 mcg patches for now, then change to 100 mcg patch with next prescription if appropriate). Dilaudid refilled and I have encouraged her to use this when she is in pain. Prescription sent for Protonix 40 mg bid due to large, high risk duodenal ulcer.  DISCUSSION: Dr Michail Sermon is understandably concerned that platelet count not drop with chemo in  near future, to allow healing of the ulcer. We have decided to begin treatment with weekly taxol, which should be fairly well tolerated including counts. We can add carboplatin as originally planned likely in next few weeks. Wed or Fri for treatment is best for her transportation.  We will ask Dr Sondra Come to see for possible directed radiation to soft tissue involvement at mons pubis, which should be ok with the low dose taxol.   Note she may be eligible for Nivolumab study if treatment needed beyond [place] taxol. This study is not yet available thru IRB; I have referenced it but have not discussed  directly with her. I have asked [place] to confirm that taxol Norma Fredrickson is accepted first regimen for the study and that taxol only for cycle 1 also ok, as I do not want to cause her to be ineligible from choice of regimen if we want that study later.  Assessment/Plan: 1.progressive metastatic squamous cell carcinoma of cervix with bilateral perirectal, vaginal and likely suprapubic involvement: limited further radiation possible. Plan likely carbo taxol, tho at least first cycle will be single agent taxol to avoid low platelets as duodenal ulcer heals. Will not use oral decadron premed for taxol because of the duodenal ulcer, but will use IV decadron premed. Note she may be eligible for upcoming Nivolumab study after one chemo regimen now, and I am confirming regimen plan with [place].  Will ask Dr Sondra Come to see in case directed RT to mons pubis area is possible. 2.duodenal ulcer: cratered but not actively bleeding at upper endoscopy 09-14-13, symptoms already much better with bid Protonix. Appreciate Dr Kathline Magic assistance with endoscopy and contacting me with results. Will try to avoid low platelets with chemo for next several weeks, and avoid oral decadron.  3.JJ stents for obstruction from cervical cancer. Followed by Dr [facility] Meckel 4.weight loss and poor nutritional status in setting of chronic illness:  po intake a little better by diet history now, but continues to lose weight. I do not believe she has seen [place] since April, and I will ask them to follow up 5.anemia related to iron deficiency, pelvic RT, previous chemo, chronic illness. Needs oral or IV iron. Will begin hemocyte now, consider IV if does not tolerate or not improving. 6.Living Will and HCPOA in place 7.flu vaccine done today  I will see her back next week ~ day of chemo . All questions answered. We will be back in touch with her re no oral decadron premeds for taxol (or if change in regimen needed in order to ensure eligibility for later Nivolumab study); note I have not discussed Nivolumab study with her more that mentioning possible other treatments after one chemo regimen.  Time spent 40 min including >50% counseling and coordination of care.    Gordy Levan, MD   09/22/2013, 12:43 PM

## 2013-09-22 NOTE — Telephone Encounter (Signed)
Per staff message and POF I have scheduled appts. Advised scheduler of appts. JMW  

## 2013-09-23 ENCOUNTER — Other Ambulatory Visit: Payer: Self-pay | Admitting: Oncology

## 2013-09-23 DIAGNOSIS — C539 Malignant neoplasm of cervix uteri, unspecified: Secondary | ICD-10-CM

## 2013-09-24 ENCOUNTER — Other Ambulatory Visit: Payer: Self-pay | Admitting: Oncology

## 2013-09-25 ENCOUNTER — Telehealth: Payer: Self-pay

## 2013-09-25 NOTE — Progress Notes (Signed)
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   Biopsies revealed:   06/16/13 Diagnosis 1. Vagina, biopsy, anterior - POSITIVE FOR INVASIVE POORLY DIFFERENTIATED SQUAMOUS CELL CARCINOMA. 2. Cervix, biopsy - FRAGMENTS OF BENIGN ENDOMETRIAL POLYP, NO EVIDENCE OF HYPERPLASIA OR MALIGNANCY. - FRAGMENTS OF CERVICAL STROMA WITH ASSOCIATED GRANULATION TISSUE AND CHRONIC INFLAMMATION, NO DYSPLASIA OR MALIGNANCY. 3. Soft tissue, NOS, clitoral - METASTATIC POORLY DIFFERENTIATED SQUAMOUS CELL CARCINOMA WITH ASSOCIATED ANGIOLYMPHATIC INVASION, FOCALLY EXTENDING TO THE INKED CAUTERIZED DEEP MARGIN.  Past/Anticipated interventions by Gyn/Onc surgery, if any: none  Past/Anticipated interventions by medical oncology, if any: Plan likely carbo taxol, tho at least first cycle will be single agent taxol   Weight changes, if any: has lost 54 lbs since 1/15. Is now drinking protein shakes and protein bars.  Bowel/Bladder complaints, if any: denies bladder issues, has multiple bowel movements per day  Nausea/Vomiting, if any: yes vomited yesterday.  Takes ativan as needed for nausea.  Pain issues, if any:  Yes pain at a 9/10 in lower abdomen/pelvis.  She is using a 100 mcg fentanyl patch and is taking 1-2 tablets of dilaudid 4 mg q 5 hours.  SAFETY ISSUES:  Prior radiation? Yes 02/13/2013-3/6/2015pelvis and periaortic area 45 gray 06/27/13, 07/11/13, 07/18/13, 07/25/13  HDR to mid to distal vagina 24 gray  Pacemaker/ICD? no  Possible current pregnancy? no  Is the patient on methotrexate? no  Current Complaints / other details:  Patient recently diagnosed with duodenal ulcer: cratered but not actively bleeding at upper endoscopy 09-14-13, symptoms already much better with bid Protonix.  She is in a lot of pain today in her lower abdomen/pelvis area.

## 2013-09-25 NOTE — Telephone Encounter (Signed)
Message copied by Baruch Merl on Mon Sep 25, 2013  8:40 AM ------      Message from: Gordy Levan      Created: Sat Sep 23, 2013  2:51 PM       I changed my mind about oral premed decadron for taxol - with the ulcer, I do NOT want her to take the oral decadron before chemo. I will give IV decadron here.            Also I am clarifying with Adonis Huguenin some eligibility criteria for a study that we may want later. Depending on what Adonis Huguenin says, I may have to redo present chemo plans. Patient doesn't know about this yet.       Fine to go ahead and tell Vaughan Basta NOT to take premed steroids, and also tell her I am discussing this chemo plan with colleagues in case different option would be better - we will let her know if any other change.            thanks             ------

## 2013-09-25 NOTE — Telephone Encounter (Signed)
Discussed the changes in premedication and possibility of different chemotherapy agents as noted below by Dr. Marko Plume.

## 2013-09-27 ENCOUNTER — Ambulatory Visit (HOSPITAL_BASED_OUTPATIENT_CLINIC_OR_DEPARTMENT_OTHER): Payer: BC Managed Care – PPO | Admitting: Oncology

## 2013-09-27 ENCOUNTER — Ambulatory Visit
Admission: RE | Admit: 2013-09-27 | Discharge: 2013-09-27 | Disposition: A | Discharge status: Home / Self Care | Payer: BC Managed Care – PPO | Source: Ambulatory Visit | Attending: Radiation Oncology | Admitting: Radiation Oncology

## 2013-09-27 ENCOUNTER — Encounter: Payer: Self-pay | Admitting: Radiation Oncology

## 2013-09-27 ENCOUNTER — Encounter: Payer: Self-pay | Admitting: Oncology

## 2013-09-27 ENCOUNTER — Telehealth: Payer: Self-pay | Admitting: Oncology

## 2013-09-27 ENCOUNTER — Ambulatory Visit (HOSPITAL_BASED_OUTPATIENT_CLINIC_OR_DEPARTMENT_OTHER): Payer: BC Managed Care – PPO

## 2013-09-27 ENCOUNTER — Encounter: Payer: Self-pay | Admitting: *Deleted

## 2013-09-27 ENCOUNTER — Other Ambulatory Visit (HOSPITAL_BASED_OUTPATIENT_CLINIC_OR_DEPARTMENT_OTHER): Payer: BC Managed Care – PPO

## 2013-09-27 VITALS — BP 115/69 | HR 91 | Temp 98.1°F | Resp 18 | Ht 68.0 in | Wt 152.0 lb

## 2013-09-27 VITALS — BP 110/71 | HR 90 | Temp 98.1°F | Resp 16 | Ht 68.0 in | Wt 152.0 lb

## 2013-09-27 VITALS — BP 110/66 | HR 67 | Temp 98.1°F | Resp 16

## 2013-09-27 DIAGNOSIS — R634 Abnormal weight loss: Secondary | ICD-10-CM

## 2013-09-27 DIAGNOSIS — Z5111 Encounter for antineoplastic chemotherapy: Secondary | ICD-10-CM

## 2013-09-27 DIAGNOSIS — C539 Malignant neoplasm of cervix uteri, unspecified: Secondary | ICD-10-CM

## 2013-09-27 DIAGNOSIS — Z79899 Other long term (current) drug therapy: Secondary | ICD-10-CM | POA: Diagnosis not present

## 2013-09-27 DIAGNOSIS — D500 Iron deficiency anemia secondary to blood loss (chronic): Secondary | ICD-10-CM

## 2013-09-27 DIAGNOSIS — Z510 Encounter for antineoplastic radiation therapy: Secondary | ICD-10-CM | POA: Insufficient documentation

## 2013-09-27 LAB — CBC WITH DIFFERENTIAL/PLATELET
BASO%: 0.4 % (ref 0.0–2.0)
Basophils Absolute: 0 10*3/uL (ref 0.0–0.1)
EOS%: 5.1 % (ref 0.0–7.0)
Eosinophils Absolute: 0.5 10*3/uL (ref 0.0–0.5)
HCT: 30 % — ABNORMAL LOW (ref 34.8–46.6)
HGB: 9.7 g/dL — ABNORMAL LOW (ref 11.6–15.9)
LYMPH%: 5 % — ABNORMAL LOW (ref 14.0–49.7)
MCH: 28.9 pg (ref 25.1–34.0)
MCHC: 32.2 g/dL (ref 31.5–36.0)
MCV: 89.8 fL (ref 79.5–101.0)
MONO#: 0.6 10*3/uL (ref 0.1–0.9)
MONO%: 6.2 % (ref 0.0–14.0)
NEUT%: 83.3 % — ABNORMAL HIGH (ref 38.4–76.8)
NEUTROS ABS: 8.2 10*3/uL — AB (ref 1.5–6.5)
Platelets: 370 10*3/uL (ref 145–400)
RBC: 3.34 10*6/uL — AB (ref 3.70–5.45)
RDW: 14.2 % (ref 11.2–14.5)
WBC: 9.8 10*3/uL (ref 3.9–10.3)
lymph#: 0.5 10*3/uL — ABNORMAL LOW (ref 0.9–3.3)

## 2013-09-27 LAB — COMPREHENSIVE METABOLIC PANEL (CC13)
ALBUMIN: 2.8 g/dL — AB (ref 3.5–5.0)
AST: 7 U/L (ref 5–34)
Alkaline Phosphatase: 76 U/L (ref 40–150)
Anion Gap: 10 mEq/L (ref 3–11)
BUN: 10.2 mg/dL (ref 7.0–26.0)
CO2: 26 mEq/L (ref 22–29)
Calcium: 10 mg/dL (ref 8.4–10.4)
Chloride: 98 mEq/L (ref 98–109)
Creatinine: 1.2 mg/dL — ABNORMAL HIGH (ref 0.6–1.1)
Glucose: 125 mg/dl (ref 70–140)
POTASSIUM: 3.8 meq/L (ref 3.5–5.1)
Sodium: 135 mEq/L — ABNORMAL LOW (ref 136–145)
TOTAL PROTEIN: 7.5 g/dL (ref 6.4–8.3)
Total Bilirubin: 0.4 mg/dL (ref 0.20–1.20)

## 2013-09-27 MED ORDER — PACLITAXEL CHEMO INJECTION 300 MG/50ML
40.0000 mg/m2 | Freq: Once | INTRAVENOUS | Status: AC
  Administered 2013-09-27: 72 mg via INTRAVENOUS
  Filled 2013-09-27: qty 12

## 2013-09-27 MED ORDER — DIPHENHYDRAMINE HCL 50 MG/ML IJ SOLN
INTRAMUSCULAR | Status: AC
  Filled 2013-09-27: qty 1

## 2013-09-27 MED ORDER — ONDANSETRON 16 MG/50ML IVPB (CHCC)
INTRAVENOUS | Status: AC
  Filled 2013-09-27: qty 16

## 2013-09-27 MED ORDER — SODIUM CHLORIDE 0.9 % IV SOLN
Freq: Once | INTRAVENOUS | Status: AC
  Administered 2013-09-27: 14:00:00 via INTRAVENOUS

## 2013-09-27 MED ORDER — FAMOTIDINE IN NACL 20-0.9 MG/50ML-% IV SOLN
INTRAVENOUS | Status: AC
  Filled 2013-09-27: qty 50

## 2013-09-27 MED ORDER — DIPHENHYDRAMINE HCL 50 MG/ML IJ SOLN
50.0000 mg | Freq: Once | INTRAMUSCULAR | Status: AC
  Administered 2013-09-27: 50 mg via INTRAVENOUS

## 2013-09-27 MED ORDER — DEXAMETHASONE SODIUM PHOSPHATE 20 MG/5ML IJ SOLN
20.0000 mg | Freq: Once | INTRAMUSCULAR | Status: AC
  Administered 2013-09-27: 20 mg via INTRAVENOUS

## 2013-09-27 MED ORDER — DEXAMETHASONE SODIUM PHOSPHATE 20 MG/5ML IJ SOLN
INTRAMUSCULAR | Status: AC
  Filled 2013-09-27: qty 5

## 2013-09-27 MED ORDER — ONDANSETRON 16 MG/50ML IVPB (CHCC)
16.0000 mg | Freq: Once | INTRAVENOUS | Status: AC
  Administered 2013-09-27: 16 mg via INTRAVENOUS

## 2013-09-27 MED ORDER — FAMOTIDINE IN NACL 20-0.9 MG/50ML-% IV SOLN
20.0000 mg | Freq: Once | INTRAVENOUS | Status: AC
  Administered 2013-09-27: 20 mg via INTRAVENOUS

## 2013-09-27 NOTE — Telephone Encounter (Signed)
per pof to add fere to trmt next week 9/23-sent MW email to add to trmt-no chng in sch

## 2013-09-27 NOTE — Patient Instructions (Signed)
[facility] Discharge Instructions for Patients Receiving Chemotherapy  Today you received the following chemotherapy agents :  Taxol.  To help prevent nausea and vomiting after your treatment, we encourage you to take your nausea medication as prescribed by your physician.  Take Zofran  8-16mg  by mouth every 12 hours as needed for nausea.  Can also take Ativan 1mg  by mouth or under tongue  every 6 hours as needed for nausea.  DO Not  Drive  After taking  Ativan as this med can cause drowsiness.  DO Not  Eat  Greasy  Nor  Spicy  Foods.   DO  Drink  Lots of fluids as tolerated.   If you develop nausea and vomiting that is not controlled by your nausea medication, call the clinic.   BELOW ARE SYMPTOMS THAT SHOULD BE REPORTED IMMEDIATELY:  *FEVER GREATER THAN 100.5 F  *CHILLS WITH OR WITHOUT FEVER  NAUSEA AND VOMITING THAT IS NOT CONTROLLED WITH YOUR NAUSEA MEDICATION  *UNUSUAL SHORTNESS OF BREATH  *UNUSUAL BRUISING OR BLEEDING  TENDERNESS IN MOUTH AND THROAT WITH OR WITHOUT PRESENCE OF ULCERS  *URINARY PROBLEMS  *BOWEL PROBLEMS  UNUSUAL RASH Items with * indicate a potential emergency and should be followed up as soon as possible.  Feel free to call the clinic you have any questions or concerns. The clinic phone number is [phone].

## 2013-09-27 NOTE — Progress Notes (Signed)
Pt tolerated first time Taxol infusion without difficulty.  VSS.  Taxol infused longer than time ordered due to first infusion.

## 2013-09-27 NOTE — Progress Notes (Signed)
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 She underwent a recent CT scan which shows progression of her disease. Patient is scheduled to begin chemotherapy later today. Patient has had more discomfort in the perineum. She's had minimal vaginal bleeding she complains of a full filling in the perineum. She also complains of pain in the right lower pelvis. She is having less problems with constipation in light of her aggressive bowel regimen.    S:  is allergic to latex and penicillins.  Meds: Current Outpatient Prescriptions  Medication Sig Dispense Refill  . albuterol (PROVENTIL HFA;VENTOLIN HFA) 108 (90 BASE) MCG/ACT inhaler Inhale into the lungs every 6 (six) hours as needed for wheezing or shortness of breath.      . bisacodyl (DULCOLAX) 5 MG EC tablet Take 5 mg by mouth daily as needed for moderate constipation.      . clobetasol cream (TEMOVATE) 6.26 % Apply 1 application topically 2 (two) times daily as needed.       . fentaNYL (DURAGESIC - DOSED MCG/HR) 25 MCG/HR patch Apply 1 patch with 75 mcg patch to equal 100 mcg q 72 hours  4 patch  0  . ferrous fumarate (HEMOCYTE) 325 (106 FE) MG TABS tablet Take 1 tablet (106 mg of iron total) by mouth daily.  30 each  3  . HYDROmorphone (DILAUDID) 4 MG tablet 1-2 tablet every 4 hours as needed for pain  80 tablet  0  . LORazepam (ATIVAN) 1 MG tablet Take 1 tablet under tongue or swallow every 6 hours as needed for nausea. Will make drowsy.  30 tablet  0  . magnesium hydroxide (MILK OF MAGNESIA) 400 MG/5ML suspension Take 5 mLs by mouth daily as needed for mild constipation.      .  ondansetron (ZOFRAN) 8 MG tablet Take 1-2 tablets (8-16 mg total) by mouth every 12 (twelve) hours as needed for nausea or vomiting.  30 tablet  1  . pantoprazole (PROTONIX) 40 MG tablet Take 1 tablet (40 mg total) by mouth 2 (two) times daily.  60 tablet  2  . polyethylene glycol (MIRALAX / GLYCOLAX) packet Take 17 g by mouth 2 (two) times daily as needed.      . triamcinolone cream (KENALOG) 0.1 % Apply 1 application topically as needed.      Marland Kitchen dexamethasone (DECADRON) 4 MG tablet Take 5 tabs with food 12 hrs and 6 hrs prior to taxol  10 tablet  0  . fentaNYL (DURAGESIC - DOSED MCG/HR) 75 MCG/HR Place 1 patch (75 mcg total) onto the skin every 3 (three) days.  10 patch  0   No current facility-administered medications for this encounter.    Physical Findings: The patient is in no acute distress. Patient is alert and oriented.  height is 5\' 8"  (1.727 m) and weight is 152 lb (68.947 kg). Her oral temperature is 98.1 F (36.7 C). Her blood pressure is 110/71 and her pulse is 90. Her respiration is 16. Marland Kitchen  No palpable supraclavicular or axillary adenopathy. The lungs are clear to auscultation the heart has regular rhythm and rate.  The abdomen is soft and nontender with normal bowel sounds. On pelvic examination the patient has palpable soft tissue nodule in the anterior perineum. This is estimated to be approximately 3 x 3 and half centimeters in size. There is no skin involvement. The patient has persistent disease in the vaginal vault and exam. Uncomfortable for the patient in detail exam could not be performed  Lab Findings: Lab Results  Component Value Date   WBC 10.7* 09/22/2013   HGB 9.8* 09/22/2013   HCT 30.4* 09/22/2013   MCV 90.7 09/22/2013   PLT 377 09/22/2013      Radiographic Findings: Ct Abdomen Pelvis Wo Contrast  09/01/2013   CLINICAL DATA:  Stage III-B. squamous cell carcinoma cervix, persistent disease within the vaginal vault. She has not been feeling well over the past 4  weeks. She's noticed abdominal pain and a lot of cramping. She has had some nausea and emesis.  EXAM: CT CHEST, ABDOMEN AND PELVIS WITHOUT CONTRAST  TECHNIQUE: Multidetector CT imaging of the chest, abdomen and pelvis was performed following the standard protocol without IV contrast.  COMPARISON:  PET-CT from 06/02/2013.  CT scan from 05/05/2013.  FINDINGS: CT CHEST FINDINGS  Soft tissue / Mediastinum: There is no axillary lymphadenopathy. No mediastinal or hilar lymphadenopathy. Heart size is normal. There is no pericardial effusion.  Lungs / Pleura: 4 mm right middle lobe nodule (image 31 series 4) is unchanged. Lungs are otherwise clear without other parenchymal nodule or mass. No focal airspace consolidation or pulmonary edema.  Bones: Bone windows reveal no worrisome lytic or sclerotic osseous lesions.  CT ABDOMEN AND PELVIS FINDINGS  Liver: Stable appearance of the 10 mm low-density lesion in the posterior right liver. A second very tiny lesions seen on the previous CT scan and as subcapsular hepatic dome is not evident on today's study, likely secondary to lack of intravenous contrast material.  Spleen: No splenomegaly. No focal mass lesion.  Stomach: Nondistended. No gastric wall thickening. No evidence of outlet obstruction.  Pancreas: No focal mass lesion. No dilatation of the main duct. No intraparenchymal cyst. No peripancreatic edema.  Gallbladder/Biliary: No evidence for gallstones. No pericholecystic fluid. No intrahepatic or extrahepatic biliary dilation.  Kidneys/Adrenals: No adrenal nodule or mass. Bilateral internal ureteral stents are again noted and appears stable in position.  Bowel Loops: There as some edema and/or inflammation in the region of the duodenum, pancreatic head, and hepatoduodenal ligament. There appears to be some circumferential wall thickening in the first and second portions of the duodenum. 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 There is no evidence for focal fluid collection. No extraluminal gas.  No small bowel dilatation to suggest small bowel obstruction. Terminal ileum is normal. The appendix is not visualized, but there is no edema or inflammation in the region of the cecum. Diverticular changes are noted in the distal colon without diverticulitis  Nodes: There is no retroperitoneal lymphadenopathy in the abdomen no pelvic sidewall lymphadenopathy. There is a new 1.7 x 1.5 cm left perirectal lymph node seen on image 104 of series 2. Right-sided perirectal nodule on image 101 measures 1.3 x 2.0 cm  Vasculature: No abdominal aortic aneurysm.  Pelvic Genitourinary: Fibroid change enlarges the uterus. There is no evidence for adnexal mass. Bladder is decompressed. The hypermetabolic nodules seen along the cranial aspect of the vaginal introitus has increased in size in the interval, measuring 1.9 x 2.1  cm today compared to 0.8 x 0.7 cm previously.  Bones/Musculoskeletal: Bone windows reveal no worrisome lytic or sclerotic osseous lesions.  Body Wall: No evidence for abdominal wall hernia.  Other: Persistent edema/ inflammation in the soft tissues of the pelvic floor. No intraperitoneal free fluid.  IMPRESSION: 1. Interval development of duodenal wall thickening and some subtle periduodenal edema/inflammation. This may be related to distal gastritis/duodenitis. There is a diverticulum in the duodenum and duodenum diverticulitis would also be a consideration. Pancreatitis involving the head of the pancreas could also have this appearance. No evidence for gastric outlet obstruction at this time. 2. Interval progression of the hypermetabolic nodule seen along the cranial aspect of the vaginal introitus on the recent PET-CT. This is consistent with metastatic progression. The patient also has new perirectal soft tissue nodules, likely metastatic lymph nodes, bilaterally. 3. Stable right middle  lobe pulmonary nodule. 4. Stable low-density liver lesion, likely a cyst. 5. These results will be called to the ordering clinician or representative by the Radiologist Assistant, and communication documented in the PACS or zVision Dashboard.   Electronically Signed   By: Misty Stanley M.D.   On: 09/01/2013 15:22    Impression:  Recurrent cervical cancer. I discussed 2 options for management of patient's nodule/lower vagina/perineal disease. We discussed proceeding with chemotherapy alone to see how this responds to this therapy. The other option would be to do radiation to the perineum and continue with chemotherapy. Patient is quite bothered by her nodule in this area and does wish to proceed with combination therapy.  She does understand that she is at increased risk for complications in light of her prior radiation therapy but given the situation is willing to accept these risks.  Plan:  Simulation planning September 22 with treatments to begin soon afterwards. Anticipate approximately 10 treatments.   ____________________________________ Blair Promise, MD

## 2013-09-27 NOTE — Progress Notes (Signed)
Please see the Nurse Progress Note in the MD Initial Consult Encounter for this patient. 

## 2013-09-27 NOTE — Progress Notes (Signed)
[facility] Psychosocial Distress Screening Clinical Social Work  Clinical Social Work was referred by distress screening protocol.  The patient scored a 7 on the Psychosocial Distress Thermometer which indicates moderate distress. Patients distress is a result of pain and physical symptoms.  Physician is aware and appropriate interventions have been put in place.  No social work needs at this time.  CSW available for support as needs arise.    Clinical Social Worker follow up needed: No.  ONCBCN DISTRESS SCREENING 09/27/2013  Screening Type Initial Screening  Mark the number that describes how much distress you have been experiencing in the past week 7  Physical Problem type Pain;Nausea/vomiting;Loss of appetitie;Constipation/diarrhea  Physician notified of physical symptoms Yes   Johnnye Lana, MSW, LCSW, OSW-C Clinical Social Worker [facility] [phone]

## 2013-09-27 NOTE — Progress Notes (Signed)
OFFICE PROGRESS NOTE   09/27/2013   Physicians:D.ClarkePearson,J.Kinard, C.Melinda Crutch (PCP), [facility], Louis Meckel [facility]), Ladona Horns. V.Schooler    INTERVAL HISTORY:  Patient is seen, together with friend Aimee, in continuing attention to progressive metastatic cervical cancer. Plan is to begin chemotherapy with carboplatin and taxol, tho will hold carboplatin for first cycle due to recent duodenal ulcer. She will also have ~ 10 radiation treatments to painful involvement at mons pubis and in perineal area, with simulation on 10-03-13 and dates of RT still pending. She did not take oral steroids due to duodenal ulcer.  Patient is still very uncomfortable in perineal area unless in careful positions, and is having some vaginal bleeding. She did not increase duragesic to 100 mcg until 9-15, tho we had meant for her to do that after visit on 9-11. She is having a difficult time taking oral iron and prefers to have IV iron, which we will do with treatment next week. She had no BM x 3 days before increasing miralax to tid, then multiple bowel movements in last 3 days, which has made her more comfortable. She denies any epigastric pain or back pain and denies melanotic stools. She is trying to eat and drinking protein drinks. She has had no fever and no gross hematuria.  She does not have PAC. JJ stents are due change ~ Oct She had flu vaccine 09-22-13.   ONCOLOGIC HISTORY Patient presented with heavy vaginal bleeding, with low back pain and right hip pain. She was seen by Dr Dellis Filbert, with biopsies from upper vagina and lower anterior vagina showing invasive squamous cell carcinoma. She had CT CAP in [facility] system 01-25-13 which showed necrotic mass centered in lower uterine segment and cervix 10.8 x 8.7 cm, left hydronephrosis, pelvic and retroperitoneal node involvement, rectum and sigmoid intimately associated with pelvic mass but without obstruction, advanced degenerative disc disease  at lumbosacral junction. She saw Dr Josephina Shih 01-27-13, with cervix replaced by fungating tumor extending to both pelvic sidewalls and nodular lesion halfway down anterior vaginal wall. Right hip xray 01-27-13 showed no bony mets. PET 02-10-13 at [facility] had new right hydronephrosis in addition to the previous left hydronephrosis, uptake in para-aortic nodes, left iliac nodes, bulky cervical mass, adenopathy adjacent to rectum; lungs and bones ok by PET. Bilateral JJ stents were placed 02-17-12. First CDDP was given 02-20-13. Chemo held 2-16 with acute pain and inadequate hydration; transfused 1 unit PRBCs 2-16 for Hgb 9.6. She had second CDDP on 03-10-13, then chemo held following week due to low counts. External beam RT was given 02-13-13 thru 03-17-13, 45 gray to pelvis and periaortics. She had progression in vagina and vulvar area by 06-2013, with brachytherapy given thru 07-25-13.  Review of systems as above, also: No increased SOB, no cough. No swelling LE. Remainder of 10 point Review of Systems negative.  Objective:  Vital signs in last 24 hours:  BP 115/69  Pulse 91  Temp(Src) 98.1 F (36.7 C) (Oral)  Resp 18  Ht 5\' 8"  (1.727 m)  Wt 152 lb (68.947 kg)  BMI 23.12 kg/m2  LMP 03/11/2013 Weight is stable. Alert, oriented and appropriate. Ambulatory without assistance.  No alopecia  HEENT:PERRL, sclerae not icteric. Oral mucosa a little dry without lesions, posterior pharynx clear.  Neck supple. No JVD.  Lymphatics:no cervical,suraclavicular adenopathy Resp: clear to auscultation bilaterally and normal percussion bilaterally Cardio: regular rate and rhythm. No gallop. GI: soft, nontender including epigastrium, not distended, no mass or organomegaly. Normally active bowel sounds.  Musculoskeletal/ Extremities: without pitting edema, cords, tenderness Neuro: no peripheral neuropathy. Otherwise nonfocal. PSYCH appropriate mood and affect Skin without rash, ecchymosis, petechiae   Lab  Results:  Results for orders placed in visit on 09/27/13  CBC WITH DIFFERENTIAL      Result Value Ref Range   WBC 9.8  3.9 - 10.3 10e3/uL   NEUT# 8.2 (*) 1.5 - 6.5 10e3/uL   HGB 9.7 (*) 11.6 - 15.9 g/dL   HCT 30.0 (*) 34.8 - 46.6 %   Platelets 370  145 - 400 10e3/uL   MCV 89.8  79.5 - 101.0 fL   MCH 28.9  25.1 - 34.0 pg   MCHC 32.2  31.5 - 36.0 g/dL   RBC 3.34 (*) 3.70 - 5.45 10e6/uL   RDW 14.2  11.2 - 14.5 %   lymph# 0.5 (*) 0.9 - 3.3 10e3/uL   MONO# 0.6  0.1 - 0.9 10e3/uL   Eosinophils Absolute 0.5  0.0 - 0.5 10e3/uL   Basophils Absolute 0.0  0.0 - 0.1 10e3/uL   NEUT% 83.3 (*) 38.4 - 76.8 %   LYMPH% 5.0 (*) 14.0 - 49.7 %   MONO% 6.2  0.0 - 14.0 %   EOS% 5.1  0.0 - 7.0 %   BASO% 0.4  0.0 - 2.0 %  COMPREHENSIVE METABOLIC PANEL (HF02)      Result Value Ref Range   Sodium 135 (*) 136 - 145 mEq/L   Potassium 3.8  3.5 - 5.1 mEq/L   Chloride 98  98 - 109 mEq/L   CO2 26  22 - 29 mEq/L   Glucose 125  70 - 140 mg/dl   BUN 10.2  7.0 - 26.0 mg/dL   Creatinine 1.2 (*) 0.6 - 1.1 mg/dL   Total Bilirubin 0.40  0.20 - 1.20 mg/dL   Alkaline Phosphatase 76  40 - 150 U/L   AST 7  5 - 34 U/L   ALT <6  0 - 55 U/L   Total Protein 7.5  6.4 - 8.3 g/dL   Albumin 2.8 (*) 3.5 - 5.0 g/dL   Calcium 10.0  8.4 - 10.4 mg/dL   Anion Gap 10  3 - 11 mEq/L    Iron studies from 09-22-13 serum iron low at 25 and %sat 11. (ferritin 223, likely acute phase reactant)  Studies/Results: Received from Dr Michail Sermon: Pathology report from [facility] 09-14-2013 [id number] from biopsy of duodenal ulcer, no dysplasia or malignancy.  Per that report, Helicobacter information will come as addendum.  Report to be scanned into this EMR.  Medications: I have reviewed the patient's current medications. Continue duragesic at 100 mcg, presently using 25 mcg + 75 mcg patches; expect will need 167mcg patch when rewritten.  DISCUSSION: Per Research, carbo + taxol ok as "first line" for the Nivolumab study, which  hopefully will be available at [facility] soon. I mentioned possible study to patient today, but did not discuss in detail as she still needs initial chemotherapy.   Assessment/Plan: 1.progressive metastatic squamous cell carcinoma of cervix with bilateral perirectal, vaginal and likely suprapubic involvement: limited further radiation possible. Plan carbo taxol, tho first cycle will be weekly single agent taxol to avoid low platelets as duodenal ulcer heals, day 1 today. Will not use oral decadron premed for taxol because of the duodenal ulcer, but will use IV decadron premed. Note she may be eligible for upcoming Nivolumab study after one chemo regimen now. For short course palliative RT to perineal area; likely will hold taxol during  RT. I will see her with Feraheme +/- taxol next week. 2.duodenal ulcer: cratered but not actively bleeding at upper endoscopy 09-14-13, symptoms already much better with bid Protonix. Will try to avoid low platelets with chemo for next several weeks, and avoid oral decadron.  3.JJ stents for obstruction from cervical cancer. Followed by Dr Louis Meckel, may be due change in Oct.  4.weight loss and poor nutritional status in setting of chronic illness: po intake a little better by diet history now, but continues to lose weight. I do not believe she has seen [place] since April, and I will ask them to follow up  5.anemia related to iron deficiency, pelvic RT, previous chemo, chronic illness. Not tolerating oral iron well so will give IV feraheme next week. 6.Living Will and HCPOA in place  7.flu vaccine done   All questions answered. Patient understands plan and instructions. Chemo orders confirmed. Feraheme orders placed. Time spent 30 min including >50% counseling and coordination of care.     Gordy Levan, MD   09/27/2013, 12:43 PM

## 2013-09-28 ENCOUNTER — Telehealth: Payer: Self-pay | Admitting: *Deleted

## 2013-09-28 NOTE — Telephone Encounter (Signed)
Spoke with pt today for post chemo follow up.  Stated had diarrhea last night due to pt taking Miralax and stool softener - pt takes Miralax and stool softener daily for bowel regimen.  Instructed pt not to take miralax today.  Instructed pt to increase po fluids - drinks gatorade, eat soup, and increased water intake as tolerated.  Denied nausea/vomiting; denied any new pain - has ongoing pain in abdomen at tumor areas.  Stated bladder functions fine.  Pt understood to call office with any new issues.

## 2013-10-02 ENCOUNTER — Telehealth: Payer: Self-pay | Admitting: *Deleted

## 2013-10-02 ENCOUNTER — Telehealth: Payer: Self-pay | Admitting: Oncology

## 2013-10-02 NOTE — Telephone Encounter (Signed)
, °

## 2013-10-02 NOTE — Telephone Encounter (Signed)
Per staff message and POF I have scheduled appts. Advised scheduler of appts. JMW  

## 2013-10-03 ENCOUNTER — Ambulatory Visit
Admission: RE | Admit: 2013-10-03 | Discharge: 2013-10-03 | Disposition: A | Discharge status: Home / Self Care | Payer: BC Managed Care – PPO | Source: Ambulatory Visit | Attending: Radiation Oncology | Admitting: Radiation Oncology

## 2013-10-03 DIAGNOSIS — C539 Malignant neoplasm of cervix uteri, unspecified: Secondary | ICD-10-CM

## 2013-10-03 DIAGNOSIS — Z510 Encounter for antineoplastic radiation therapy: Secondary | ICD-10-CM | POA: Diagnosis not present

## 2013-10-03 NOTE — Progress Notes (Signed)
  Radiation Oncology         ([phone]) [phone] ________________________________  Name: Gina Barnett MRN: [id number]  Date: 10/03/2013  DOB: 30-Mar-1955  SIMULATION AND TREATMENT PLANNING NOTE  DIAGNOSIS:  Recurrent cervical cancer  NARRATIVE:  The patient was brought to the [place] suite.  Identity was confirmed.  All relevant records and images related to the planned course of therapy were reviewed.  The patient freely provided informed written consent to proceed with treatment after reviewing the details related to the planned course of therapy. The consent form was witnessed and verified by the simulation staff.  Then, the patient was set-up in a stable reproducible  supine position for radiation therapy.  CT images were obtained.  Surface markings were placed.  The CT images were loaded into the planning software.  Then the target and avoidance structures were contoured.  Treatment planning then occurred.  The radiation prescription was entered and confirmed.  Then, I designed and supervised the construction of a total of 1 medically necessary complex treatment devices.  I have requested : 3D Simulation  I have requested a DVH of the following structures: GTV, bladder, rectum.  I have ordered:dose calc.  PLAN:  The patient will receive 30 Gy in 10 fractions.  ________________________________  Blair Promise M.D.    Special treatment procedure  Additional time was taking in setting up the current fields as it relates to the patient's previous radiation treatment. Given the potential for increased toxicities as well as the increased time in reviewing previous treatment located in the current treatment area, this constitutes a special treatment procedure.    Blair Promise, M.D.

## 2013-10-04 ENCOUNTER — Other Ambulatory Visit (HOSPITAL_BASED_OUTPATIENT_CLINIC_OR_DEPARTMENT_OTHER): Payer: BC Managed Care – PPO

## 2013-10-04 ENCOUNTER — Encounter: Payer: Self-pay | Admitting: Oncology

## 2013-10-04 ENCOUNTER — Ambulatory Visit (HOSPITAL_BASED_OUTPATIENT_CLINIC_OR_DEPARTMENT_OTHER): Payer: BC Managed Care – PPO

## 2013-10-04 ENCOUNTER — Other Ambulatory Visit: Payer: Self-pay | Admitting: *Deleted

## 2013-10-04 ENCOUNTER — Ambulatory Visit (HOSPITAL_BASED_OUTPATIENT_CLINIC_OR_DEPARTMENT_OTHER): Payer: BC Managed Care – PPO | Admitting: Oncology

## 2013-10-04 VITALS — BP 111/69 | HR 96 | Temp 98.0°F | Resp 18 | Ht 68.0 in | Wt 152.2 lb

## 2013-10-04 DIAGNOSIS — Z510 Encounter for antineoplastic radiation therapy: Secondary | ICD-10-CM | POA: Diagnosis not present

## 2013-10-04 DIAGNOSIS — C539 Malignant neoplasm of cervix uteri, unspecified: Secondary | ICD-10-CM

## 2013-10-04 DIAGNOSIS — C792 Secondary malignant neoplasm of skin: Secondary | ICD-10-CM

## 2013-10-04 DIAGNOSIS — Z5111 Encounter for antineoplastic chemotherapy: Secondary | ICD-10-CM

## 2013-10-04 DIAGNOSIS — D500 Iron deficiency anemia secondary to blood loss (chronic): Secondary | ICD-10-CM

## 2013-10-04 LAB — CBC WITH DIFFERENTIAL/PLATELET
BASO%: 0.2 % (ref 0.0–2.0)
BASOS ABS: 0 10*3/uL (ref 0.0–0.1)
EOS%: 3.9 % (ref 0.0–7.0)
Eosinophils Absolute: 0.4 10*3/uL (ref 0.0–0.5)
HEMATOCRIT: 30.8 % — AB (ref 34.8–46.6)
HGB: 9.8 g/dL — ABNORMAL LOW (ref 11.6–15.9)
LYMPH%: 6.7 % — ABNORMAL LOW (ref 14.0–49.7)
MCH: 29 pg (ref 25.1–34.0)
MCHC: 31.9 g/dL (ref 31.5–36.0)
MCV: 90.7 fL (ref 79.5–101.0)
MONO#: 0.6 10*3/uL (ref 0.1–0.9)
MONO%: 6.5 % (ref 0.0–14.0)
NEUT#: 7.7 10*3/uL — ABNORMAL HIGH (ref 1.5–6.5)
NEUT%: 82.7 % — AB (ref 38.4–76.8)
Platelets: 364 10*3/uL (ref 145–400)
RBC: 3.39 10*6/uL — ABNORMAL LOW (ref 3.70–5.45)
RDW: 14.7 % — AB (ref 11.2–14.5)
WBC: 9.3 10*3/uL (ref 3.9–10.3)
lymph#: 0.6 10*3/uL — ABNORMAL LOW (ref 0.9–3.3)

## 2013-10-04 LAB — COMPREHENSIVE METABOLIC PANEL (CC13)
ALK PHOS: 71 U/L (ref 40–150)
ALT: <6 U/L (ref 0–55)
AST: 7 U/L (ref 5–34)
Albumin: 2.8 g/dL — ABNORMAL LOW (ref 3.5–5.0)
Anion Gap: 9 mEq/L (ref 3–11)
BUN: 15 mg/dL (ref 7.0–26.0)
CO2: 30 mEq/L — ABNORMAL HIGH (ref 22–29)
CREATININE: 1.4 mg/dL — AB (ref 0.6–1.1)
Calcium: 10.4 mg/dL (ref 8.4–10.4)
Chloride: 98 mEq/L (ref 98–109)
Glucose: 106 mg/dl (ref 70–140)
Potassium: 3.9 mEq/L (ref 3.5–5.1)
Sodium: 137 mEq/L (ref 136–145)
Total Bilirubin: 0.34 mg/dL (ref 0.20–1.20)
Total Protein: 7.6 g/dL (ref 6.4–8.3)

## 2013-10-04 MED ORDER — DEXAMETHASONE SODIUM PHOSPHATE 20 MG/5ML IJ SOLN
INTRAMUSCULAR | Status: AC
  Filled 2013-10-04: qty 5

## 2013-10-04 MED ORDER — FENTANYL 25 MCG/HR TD PT72
25.0000 ug | MEDICATED_PATCH | TRANSDERMAL | Status: DC
Start: 2013-10-04 — End: 2013-10-11

## 2013-10-04 MED ORDER — HYDROMORPHONE HCL 4 MG PO TABS: ORAL_TABLET | ORAL | Status: DC

## 2013-10-04 MED ORDER — ONDANSETRON 8 MG/NS 50 ML IVPB
INTRAVENOUS | Status: AC
  Filled 2013-10-04: qty 8

## 2013-10-04 MED ORDER — SODIUM CHLORIDE 0.9 % IV SOLN
1020.0000 mg | Freq: Once | INTRAVENOUS | Status: AC
  Administered 2013-10-04: 1020 mg via INTRAVENOUS
  Filled 2013-10-04: qty 34

## 2013-10-04 MED ORDER — DEXAMETHASONE SODIUM PHOSPHATE 20 MG/5ML IJ SOLN
20.0000 mg | Freq: Once | INTRAMUSCULAR | Status: AC
  Administered 2013-10-04: 20 mg via INTRAVENOUS

## 2013-10-04 MED ORDER — ONDANSETRON 8 MG/50ML IVPB (CHCC)
8.0000 mg | Freq: Once | INTRAVENOUS | Status: AC
  Administered 2013-10-04: 8 mg via INTRAVENOUS

## 2013-10-04 MED ORDER — FLUCONAZOLE 100 MG PO TABS: 100.0000 mg | ORAL_TABLET | Freq: Every day | ORAL | Status: DC

## 2013-10-04 MED ORDER — FENTANYL 100 MCG/HR TD PT72: 100.0000 ug | MEDICATED_PATCH | TRANSDERMAL | Status: DC

## 2013-10-04 MED ORDER — FAMOTIDINE IN NACL 20-0.9 MG/50ML-% IV SOLN
INTRAVENOUS | Status: AC
  Filled 2013-10-04: qty 50

## 2013-10-04 MED ORDER — DIPHENHYDRAMINE HCL 50 MG/ML IJ SOLN
INTRAMUSCULAR | Status: AC
  Filled 2013-10-04: qty 1

## 2013-10-04 MED ORDER — PACLITAXEL CHEMO INJECTION 300 MG/50ML
80.0000 mg/m2 | Freq: Once | INTRAVENOUS | Status: AC
  Administered 2013-10-04: 144 mg via INTRAVENOUS
  Filled 2013-10-04: qty 24

## 2013-10-04 MED ORDER — LIDOCAINE 5 % EX PTCH: MEDICATED_PATCH | CUTANEOUS | Status: DC

## 2013-10-04 MED ORDER — DIPHENHYDRAMINE HCL 50 MG/ML IJ SOLN
50.0000 mg | Freq: Once | INTRAMUSCULAR | Status: AC
  Administered 2013-10-04: 50 mg via INTRAVENOUS

## 2013-10-04 MED ORDER — SODIUM CHLORIDE 0.9 % IV SOLN
Freq: Once | INTRAVENOUS | Status: AC
  Administered 2013-10-04: 13:00:00 via INTRAVENOUS

## 2013-10-04 MED ORDER — FAMOTIDINE IN NACL 20-0.9 MG/50ML-% IV SOLN
20.0000 mg | Freq: Once | INTRAVENOUS | Status: AC
  Administered 2013-10-04: 20 mg via INTRAVENOUS

## 2013-10-04 NOTE — Progress Notes (Signed)
OFFICE PROGRESS NOTE   10/04/2013   Physicians:D.ClarkePearson,J.Kinard, C.Melinda Crutch (PCP), [facility], Louis Meckel [facility]), Ladona Horns. V.Schooler    INTERVAL HISTORY:  Patient is seen, alone for visit, in continuing attention to progressive metastatic cervical cancer, due second taxol today and to begin IMRT on 10-11-13  for symptomatic involvement of perineum and mons pubis. Carboplatin has not been used with this first cycle chemo due to recently documented duodenal ulcer, to avoid possible thrombocytopenia. Only premed steroid with taxol has been IV decadron, again due to the ulcer. We will hold chemo during the 10 planned radiation treatments, but will give taxol today.  Patient tolerated low dose taxol last week without significant problems. She has had occasional minimal nausea, no taxol aches and no peripheral neuropathy symptoms. She continues with slight vaginal bleeding, no other bleeding including in stools. In addition to fentanyl patch now at 100 mcg every 72 hours, she uses dilaudid ~ 5x in 24 hours, 4-8 mg each dose. She is not completely comfortable even after the dilaudid doses, such that we will increase duragesic to 125 mcg every 72 hours in addition to same prn dilaudid. Bowels are moving with miralax etc. She is trying to eat. She denies epigastric pain now, continues protonix.   She does not have PAC JJ stents are due change ~ Oct She has had flu vaccine.    ONCOLOGIC HISTORY Patient presented with heavy vaginal bleeding, with low back pain and right hip pain. She was seen by Dr Dellis Filbert, with biopsies from upper vagina and lower anterior vagina showing invasive squamous cell carcinoma. 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 She saw Dr Josephina Shih 01-27-13, with cervix replaced by fungating tumor extending to both pelvic sidewalls and nodular lesion halfway down anterior vaginal wall. Right hip xray 01-27-13 showed no bony mets. PET 02-10-13 at [facility] had new right hydronephrosis in addition to the previous left hydronephrosis, uptake in para-aortic nodes, left iliac nodes, bulky cervical mass, adenopathy adjacent to rectum; lungs and bones ok by PET. Bilateral JJ stents were placed 02-17-12. First CDDP was given 02-20-13. Chemo held 2-16 with acute pain and inadequate hydration; transfused 1 unit PRBCs 2-16 for Hgb 9.6. She had second CDDP on 03-10-13, then chemo held following week due to low counts. External beam RT was given 02-13-13 thru 03-17-13, 45 gray to pelvis and periaortics. She had progression in vagina and vulvar area by 06-2013, with brachytherapy given thru 07-25-13.  Review of systems as above, also: No fever or symptoms of infection. No other bleeding. No melanotic stools. No vomiting Remainder of 10 point Review of Systems negative.  Objective:  Vital signs in last 24 hours:  BP 111/69  Pulse 96  Temp(Src) 98 F (36.7 C) (Oral)  Resp 18  Ht 5\' 8"  (1.727 m)  Wt 152 lb 4 oz (69.06 kg)  BMI 23.15 kg/m2  LMP 03/11/2013 Weight is stable. Alert, oriented and appropriate. Ambulatory without assistance. Pain anterior perineum to mons pubis when sits. No alopecia  HEENT:PERRL, sclerae not icteric. Oral mucosa moist without lesions, posterior pharynx clear.  Neck supple. No JVD.  Lymphatics:no cervical,supraclavicular adenopathy Resp: clear to auscultation bilaterally and normal percussion bilaterally Cardio: regular rate and rhythm. No gallop. GI: soft, nontender including epigastrium, not  distended, no mass or organomegaly. A few bowel sounds.  Musculoskeletal/ Extremities: without pitting edema, cords, tenderness. Tender to minimal pressure at  mons pubis, with ~ 3-4 cm thickening in soft tissue Neuro: no peripheral neuropathy. Otherwise nonfocal. PSYCH mood and affect appropriate given pain and rest of situation Skin without rash, ecchymosis, petechiae   Lab Results:  Results for orders placed in visit on 10/04/13  CBC WITH DIFFERENTIAL      Result Value Ref Range   WBC 9.3  3.9 - 10.3 10e3/uL   NEUT# 7.7 (*) 1.5 - 6.5 10e3/uL   HGB 9.8 (*) 11.6 - 15.9 g/dL   HCT 30.8 (*) 34.8 - 46.6 %   Platelets 364  145 - 400 10e3/uL   MCV 90.7  79.5 - 101.0 fL   MCH 29.0  25.1 - 34.0 pg   MCHC 31.9  31.5 - 36.0 g/dL   RBC 3.39 (*) 3.70 - 5.45 10e6/uL   RDW 14.7 (*) 11.2 - 14.5 %   lymph# 0.6 (*) 0.9 - 3.3 10e3/uL   MONO# 0.6  0.1 - 0.9 10e3/uL   Eosinophils Absolute 0.4  0.0 - 0.5 10e3/uL   Basophils Absolute 0.0  0.0 - 0.1 10e3/uL   NEUT% 82.7 (*) 38.4 - 76.8 %   LYMPH% 6.7 (*) 14.0 - 49.7 %   MONO% 6.5  0.0 - 14.0 %   EOS% 3.9  0.0 - 7.0 %   BASO% 0.2  0.0 - 2.0 %  COMPREHENSIVE METABOLIC PANEL (CV89)      Result Value Ref Range   Sodium 137  136 - 145 mEq/L   Potassium 3.9  3.5 - 5.1 mEq/L   Chloride 98  98 - 109 mEq/L   CO2 30 (*) 22 - 29 mEq/L   Glucose 106  70 - 140 mg/dl   BUN 15.0  7.0 - 26.0 mg/dL   Creatinine 1.4 (*) 0.6 - 1.1 mg/dL   Total Bilirubin 0.34  0.20 - 1.20 mg/dL   Alkaline Phosphatase 71  40 - 150 U/L   AST 7  5 - 34 U/L   ALT <6  0 - 55 U/L   Total Protein 7.6  6.4 - 8.3 g/dL   Albumin 2.8 (*) 3.5 - 5.0 g/dL   Calcium 10.4  8.4 - 10.4 mg/dL   Anion Gap 9  3 - 11 mEq/L     Studies/Results:  No results found.  Medications: I have reviewed the patient's current medications. WIll try lidoderm patch to mons pubis, ok to leave patch in place x24 hours (other than during RT treatment itself) Increase duragesic to total 120 mcg every 72 hours. Prescriptions for duragesic and dilaudid given. Stop oral iron. Feraheme given today.  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 Plan carbo taxol, tho first cycle will be weekly single agent taxol to avoid low platelets as duodenal ulcer heals, day 8 today. No oral decadron premed for taxol because of the duodenal ulcer, but will use IV decadron premed. Note she may be eligible for upcoming Nivolumab study after one chemo regimen now. For short course palliative RT to perineal area 9-30 thru 10-24-13;  will hold chemo during RT. I will see her with Feraheme +/- taxol next week.  2.duodenal ulcer: cratered but not actively bleeding at upper endoscopy 09-14-13, symptoms already much better with bid Protonix. Will try to avoid low platelets with chemo for next  several weeks, and avoid oral decadron.  3.JJ stents for obstruction from cervical cancer. Followed by Dr Louis Meckel, may be due change in Oct.  4.weight loss and poor nutritional status in setting of chronic illness: po intake not ideal, but drinking protein shakes now.  5.anemia related to iron deficiency, pelvic RT, previous chemo, chronic illness. Did not tolerate oral iron, now DCd. Feraheme today.  6.Living Will and HCPOA in place  7.flu vaccine done    Message to [facility] chaplain sent. I will see her back on 9-30 but will not give chemo that day. Gayatri knows that she can call between visits if needed.   LIVESAY,LENNIS P, MD   10/04/2013, 12:42 PM

## 2013-10-04 NOTE — Patient Instructions (Signed)
[place] Discharge Instructions for Patients Receiving Chemotherapy  Today you received the following chemotherapy agents taxol    To help prevent nausea and vomiting after your treatment, we encourage you to take your nausea medication as directed.    If you develop nausea and vomiting that is not controlled by your nausea medication, call the clinic.   BELOW ARE SYMPTOMS THAT SHOULD BE REPORTED IMMEDIATELY:  *FEVER GREATER THAN 100.5 F  *CHILLS WITH OR WITHOUT FEVER  NAUSEA AND VOMITING THAT IS NOT CONTROLLED WITH YOUR NAUSEA MEDICATION  *UNUSUAL SHORTNESS OF BREATH  *UNUSUAL BRUISING OR BLEEDING  TENDERNESS IN MOUTH AND THROAT WITH OR WITHOUT PRESENCE OF ULCERS  *URINARY PROBLEMS  *BOWEL PROBLEMS  UNUSUAL RASH Items with * indicate a potential emergency and should be followed up as soon as possible.  Feel free to call the clinic you have any questions or concerns. The clinic phone number is [phone].   Ferumoxytol injection What is this medicine? FERUMOXYTOL is an iron complex. Iron is used to make healthy red blood cells, which carry oxygen and nutrients throughout the body. This medicine is used to treat iron deficiency anemia in people with chronic kidney disease. This medicine may be used for other purposes; ask your health care provider or pharmacist if you have questions. COMMON BRAND NAME(S): Feraheme What should I tell my health care provider before I take this medicine? They need to know if you have any of these conditions: -anemia not caused by low iron levels -high levels of iron in the blood -magnetic resonance imaging (MRI) test scheduled -an unusual or allergic reaction to iron, other medicines, foods, dyes, or preservatives -pregnant or trying to get pregnant -breast-feeding How should I use this medicine? This medicine is for injection into a vein. It is given by a health care professional in a hospital or clinic setting. Talk  to your pediatrician regarding the use of this medicine in children. Special care may be needed. Overdosage: If you think you've taken too much of this medicine contact a poison control center or emergency room at once. Overdosage: If you think you have taken too much of this medicine contact a poison control center or emergency room at once. NOTE: This medicine is only for you. Do not share this medicine with others. What if I miss a dose? It is important not to miss your dose. Call your doctor or health care professional if you are unable to keep an appointment. What may interact with this medicine? This medicine may interact with the following medications: -other iron products This list may not describe all possible interactions. Give your health care provider a list of all the medicines, herbs, non-prescription drugs, or dietary supplements you use. Also tell them if you smoke, drink alcohol, or use illegal drugs. Some items may interact with your medicine. What should I watch for while using this medicine? Visit your doctor or healthcare professional regularly. Tell your doctor or healthcare professional if your symptoms do not start to get better or if they get worse. You may need blood work done while you are taking this medicine. You may need to follow a special diet. Talk to your doctor. Foods that contain iron include: whole grains/cereals, dried fruits, beans, or peas, leafy green vegetables, and organ meats (liver, kidney). What side effects may I notice from receiving this medicine? Side effects that you should report to your doctor or health care professional as soon as possible: -allergic reactions like skin  rash, itching or hives, swelling of the face, lips, or tongue -breathing problems -changes in blood pressure -feeling faint or lightheaded, falls -fever or chills -flushing, sweating, or hot feelings -swelling of the ankles or feet Side effects that usually do not require  medical attention (Report these to your doctor or health care professional if they continue or are bothersome.): -diarrhea -headache -nausea, vomiting -stomach pain This list may not describe all possible side effects. Call your doctor for medical advice about side effects. You may report side effects to FDA at 1-800-FDA-1088. Where should I keep my medicine? This drug is given in a hospital or clinic and will not be stored at home. NOTE: This sheet is a summary. It may not cover all possible information. If you have questions about this medicine, talk to your doctor, pharmacist, or health care provider.  2015, [employer]vier/Gold Standard. (2011-08-14 15:23:36)

## 2013-10-07 ENCOUNTER — Other Ambulatory Visit: Payer: Self-pay | Admitting: Oncology

## 2013-10-09 ENCOUNTER — Other Ambulatory Visit: Payer: BC Managed Care – PPO

## 2013-10-09 ENCOUNTER — Ambulatory Visit: Payer: BC Managed Care – PPO | Admitting: Oncology

## 2013-10-10 DIAGNOSIS — Z510 Encounter for antineoplastic radiation therapy: Secondary | ICD-10-CM | POA: Diagnosis not present

## 2013-10-11 ENCOUNTER — Ambulatory Visit: Payer: BC Managed Care – PPO

## 2013-10-11 ENCOUNTER — Ambulatory Visit
Admission: RE | Admit: 2013-10-11 | Discharge: 2013-10-11 | Disposition: A | Discharge status: Home / Self Care | Payer: BC Managed Care – PPO | Source: Ambulatory Visit | Attending: Radiation Oncology | Admitting: Radiation Oncology

## 2013-10-11 ENCOUNTER — Other Ambulatory Visit (HOSPITAL_BASED_OUTPATIENT_CLINIC_OR_DEPARTMENT_OTHER): Payer: BC Managed Care – PPO

## 2013-10-11 ENCOUNTER — Encounter: Payer: Self-pay | Admitting: Oncology

## 2013-10-11 ENCOUNTER — Ambulatory Visit (HOSPITAL_BASED_OUTPATIENT_CLINIC_OR_DEPARTMENT_OTHER): Payer: BC Managed Care – PPO | Admitting: Oncology

## 2013-10-11 VITALS — BP 116/70 | HR 88 | Temp 97.8°F | Resp 18 | Ht 68.0 in | Wt 152.6 lb

## 2013-10-11 DIAGNOSIS — D509 Iron deficiency anemia, unspecified: Secondary | ICD-10-CM

## 2013-10-11 DIAGNOSIS — Z510 Encounter for antineoplastic radiation therapy: Secondary | ICD-10-CM | POA: Diagnosis not present

## 2013-10-11 DIAGNOSIS — C539 Malignant neoplasm of cervix uteri, unspecified: Secondary | ICD-10-CM

## 2013-10-11 DIAGNOSIS — R634 Abnormal weight loss: Secondary | ICD-10-CM

## 2013-10-11 LAB — COMPREHENSIVE METABOLIC PANEL (CC13)
AST: 8 U/L (ref 5–34)
Albumin: 2.7 g/dL — ABNORMAL LOW (ref 3.5–5.0)
Alkaline Phosphatase: 75 U/L (ref 40–150)
Anion Gap: 8 mEq/L (ref 3–11)
BILIRUBIN TOTAL: 0.29 mg/dL (ref 0.20–1.20)
BUN: 13.6 mg/dL (ref 7.0–26.0)
CO2: 26 meq/L (ref 22–29)
Calcium: 9.9 mg/dL (ref 8.4–10.4)
Chloride: 98 mEq/L (ref 98–109)
Creatinine: 1.6 mg/dL — ABNORMAL HIGH (ref 0.6–1.1)
Glucose: 126 mg/dl (ref 70–140)
Potassium: 4.1 mEq/L (ref 3.5–5.1)
SODIUM: 132 meq/L — AB (ref 136–145)
Total Protein: 7.2 g/dL (ref 6.4–8.3)

## 2013-10-11 LAB — CBC WITH DIFFERENTIAL/PLATELET
BASO%: 0.2 % (ref 0.0–2.0)
Basophils Absolute: 0 10*3/uL (ref 0.0–0.1)
EOS ABS: 0.1 10*3/uL (ref 0.0–0.5)
EOS%: 1.5 % (ref 0.0–7.0)
HCT: 26.4 % — ABNORMAL LOW (ref 34.8–46.6)
HGB: 8.5 g/dL — ABNORMAL LOW (ref 11.6–15.9)
LYMPH#: 0.5 10*3/uL — AB (ref 0.9–3.3)
LYMPH%: 8.1 % — ABNORMAL LOW (ref 14.0–49.7)
MCH: 29.2 pg (ref 25.1–34.0)
MCHC: 32.2 g/dL (ref 31.5–36.0)
MCV: 90.7 fL (ref 79.5–101.0)
MONO#: 0.4 10*3/uL (ref 0.1–0.9)
MONO%: 7.3 % (ref 0.0–14.0)
NEUT%: 82.9 % — ABNORMAL HIGH (ref 38.4–76.8)
NEUTROS ABS: 5 10*3/uL (ref 1.5–6.5)
NRBC: 0 % (ref 0–0)
Platelets: 321 10*3/uL (ref 145–400)
RBC: 2.91 10*6/uL — AB (ref 3.70–5.45)
RDW: 14.7 % — AB (ref 11.2–14.5)
WBC: 6 10*3/uL (ref 3.9–10.3)

## 2013-10-11 MED ORDER — FENTANYL 25 MCG/HR TD PT72: 25.0000 ug | MEDICATED_PATCH | TRANSDERMAL | Status: DC

## 2013-10-11 MED ORDER — FENTANYL 100 MCG/HR TD PT72: 100.0000 ug | MEDICATED_PATCH | TRANSDERMAL | Status: DC

## 2013-10-11 MED ORDER — ONDANSETRON HCL 8 MG PO TABS: 8.0000 mg | ORAL_TABLET | Freq: Two times a day (BID) | ORAL | Status: DC | PRN

## 2013-10-11 NOTE — Progress Notes (Signed)
  Radiation Oncology         (336) [phone] ________________________________  Name: Gina Barnett MRN: [id number]  Date: 10/11/2013  DOB: [birth year]  Simulation Verification Note  Status: outpatient  NARRATIVE: The patient was brought to the treatment unit and placed in the planned treatment position. The clinical setup was verified. Then port films were obtained and uploaded to the radiation oncology medical record software.  The treatment beams were carefully compared against the planned radiation fields. The position location and shape of the radiation fields was reviewed. They targeted volume of tissue appears to be appropriately covered by the radiation beams. Organs at risk appear to be excluded as planned.  Based on my personal review, I approved the simulation verification. The patient's treatment will proceed as planned.  -----------------------------------  Blair Promise, PhD, MD

## 2013-10-11 NOTE — Progress Notes (Signed)
OFFICE PROGRESS NOTE   10/11/2013   Physicians:D.ClarkePearson,J.Kinard, C.Melinda Crutch (PCP), [facility], Louis Meckel [facility]), Ladona Horns. V.Schooler   INTERVAL HISTORY:  Patient is seen, alone for visit, in continuing attention to progressive metastatic cervical cancer. She had taxol on 9-16 and 10-04-13, and is to begin radiation to perineal area today, planned thru 10-24-13. We will hold chemo during this radiation. Pain has been somewhat better controlled on duragesic 125 micrograms since last week, tho still needed prn dilaudid x3 yesterday and x3 by midAM today. Lidoderm patch to suprapubic area was not helpful. She had more nausea beginning 9-25, managed with zofran and ativan ongoing. Bowels are either diarrhea or do not move. She continues slight vaginal bleeding. She tolerated feraheme 1020 mg on 10-04-13 without difficulty and will stop po iron now.  She does not have PAC. JJ stents due change this month She has had flu vaccine.   ONCOLOGIC HISTORY Patient presented with heavy vaginal bleeding, with low back pain and right hip pain. She was seen by Dr Dellis Filbert, with biopsies from upper vagina and lower anterior vagina showing invasive squamous cell carcinoma. 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 She saw Dr Josephina Shih 01-27-13, with cervix replaced by fungating tumor extending to both pelvic sidewalls and nodular lesion halfway down anterior vaginal wall. Right hip xray 01-27-13 showed no bony mets. PET 02-10-13 at [facility] had new right hydronephrosis in addition to the previous left hydronephrosis, uptake in para-aortic nodes, left iliac nodes, bulky cervical mass, adenopathy adjacent  to rectum; lungs and bones ok by PET. Bilateral JJ stents were placed 02-17-12. First CDDP was given 02-20-13. Chemo held 2-16 with acute pain and inadequate hydration; transfused 1 unit PRBCs 2-16 for Hgb 9.6. She had second CDDP on 03-10-13, then chemo held following week due to low counts. External beam RT was given 02-13-13 thru 03-17-13, 45 gray to pelvis and periaortics. She had progression in vagina and vulvar area by 06-2013, with brachytherapy given thru 07-25-13.  Review of systems as above, also: No fever, no increased SOB, difficult to sleep as has been case .Tries to eat Remainder of 10 point Review of Systems negative.  Objective:  Vital signs in last 24 hours:  BP 116/70  Pulse 88  Temp(Src) 97.8 F (36.6 C) (Oral)  Resp 18  Ht 5\' 8"  (1.727 m)  Wt 152 lb 9.6 oz (69.219 kg)  BMI 23.21 kg/m2  LMP 03/11/2013  Alert, oriented and appropriate. Ambulatory without assistance, difficulty sitting and lying on exam table due to perineal pain. Respirations not labored RA. Looks more comfortable than at last visit No alopecia  HEENT:PERRL, sclerae not icteric. Oral mucosa moist without lesions, posterior pharynx clear.  Neck supple. No JVD.  Lymphatics:no cervical,suraclavicular adenopathy Resp: clear to auscultation bilaterally and normal percussion bilaterally Cardio: regular rate and rhythm. No gallop. GI: soft, nontender including epigastrium, not distended, no mass or organomegaly. Normally active bowel sounds.  Musculoskeletal/ Extremities: without pitting edema, cords, tenderness Neuro: no peripheral neuropathy. Otherwise nonfocal Skin without rash, ecchymosis, petechiae   Lab Results:  Results for orders placed in visit on 10/11/13  CBC WITH DIFFERENTIAL      Result Value Ref Range   WBC 6.0  3.9 - 10.3 10e3/uL   NEUT# 5.0  1.5 - 6.5 10e3/uL   HGB 8.5 (*) 11.6 - 15.9 g/dL   HCT 26.4 (*) 34.8 - 46.6 %   Platelets 321  145 - 400 10e3/uL   MCV 90.7  79.5 - 101.0 fL   MCH  29.2  25.1 - 34.0 pg   MCHC 32.2  31.5 - 36.0 g/dL   RBC 2.91 (*) 3.70 - 5.45 10e6/uL   RDW 14.7 (*) 11.2 - 14.5 %   lymph# 0.5 (*) 0.9 - 3.3 10e3/uL   MONO# 0.4  0.1 - 0.9 10e3/uL   Eosinophils Absolute 0.1  0.0 - 0.5 10e3/uL   Basophils Absolute 0.0  0.0 - 0.1 10e3/uL   NEUT% 82.9 (*) 38.4 - 76.8 %   LYMPH% 8.1 (*) 14.0 - 49.7 %   MONO% 7.3  0.0 - 14.0 %   EOS% 1.5  0.0 - 7.0 %   BASO% 0.2  0.0 - 2.0 %   nRBC 0  0 - 0 %  COMPREHENSIVE METABOLIC PANEL (AC16)      Result Value Ref Range   Sodium 132 (*) 136 - 145 mEq/L   Potassium 4.1  3.5 - 5.1 mEq/L   Chloride 98  98 - 109 mEq/L   CO2 26  22 - 29 mEq/L   Glucose 126  70 - 140 mg/dl   BUN 13.6  7.0 - 26.0 mg/dL   Creatinine 1.6 (*) 0.6 - 1.1 mg/dL   Total Bilirubin 0.29  0.20 - 1.20 mg/dL   Alkaline Phosphatase 75  40 - 150 U/L   AST 8  5 - 34 U/L   ALT <6  0 - 55 U/L   Total Protein 7.2  6.4 - 8.3 g/dL   Albumin 2.7 (*) 3.5 - 5.0 g/dL   Calcium 9.9  8.4 - 10.4 mg/dL   Anion Gap 8  3 - 11 mEq/L     Studies/Results:  No results found.  Medications: I have reviewed the patient's current medications. Stop po iron. Not using po decadron premed for taxol due to ulcer. Continue duragesic at 125 mcg q 72 hrs. Continue prn dilaudid  DISCUSSION: Patient aware from EMR printout that chemo is in palliative attempt, which we have discussed now. She understands that disease is not in vital organs now and that systemic treatment + RT is in attempt to control disease and to improve quality of life. I have explained that chemo is standard intervention at this point in disease if patient wants to continue treatment, and that there is possibility of study (nivolumab) if chemo does not help sufficiently. I have told her that Hospice can be involved at any time if they can be of help and if she is not continuing systemic treatments with chemo or similar agents. 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  Assessment/Plan:  1.progressive metastatic squamous cell carcinoma of cervix with bilateral perirectal, vaginal and suprapubic involvement: Additional radiation to be given today thru 10-24-13. Will resume chemo after RT, with carboplatin + weekly taxol if stable.  2.duodenal ulcer: cratered but not actively bleeding at upper endoscopy 09-14-13, symptoms much better with bid Protonix. Trying to avoid low platelets and not using po decadron with this chemo. 3.JJ stents for obstruction from cervical cancer. Followed by Dr Louis Meckel, due change upcoming.  4.weight loss and poor nutritional status in setting of chronic illness, and social concerns:  I would still appreciate nutritionist and chaplain follow up 5.anemia related to iron deficiency, pelvic RT, previous chemo, chronic illness. Not tolerating oral iron well so will give IV feraheme next week.  6.Living Will and HCPOA in place  7.flu vaccine done   All questions answered. Patient understands discussion and is in agreement with plans.    Nhi Butrum P, MD   10/11/2013, 12:32 PM

## 2013-10-12 ENCOUNTER — Ambulatory Visit
Admission: RE | Admit: 2013-10-12 | Discharge: 2013-10-12 | Disposition: A | Discharge status: Home / Self Care | Payer: BC Managed Care – PPO | Source: Ambulatory Visit | Attending: Radiation Oncology | Admitting: Radiation Oncology

## 2013-10-12 ENCOUNTER — Ambulatory Visit: Payer: BC Managed Care – PPO

## 2013-10-12 ENCOUNTER — Other Ambulatory Visit: Payer: Self-pay | Admitting: Oncology

## 2013-10-12 DIAGNOSIS — C772 Secondary and unspecified malignant neoplasm of intra-abdominal lymph nodes: Secondary | ICD-10-CM | POA: Insufficient documentation

## 2013-10-12 DIAGNOSIS — C775 Secondary and unspecified malignant neoplasm of intrapelvic lymph nodes: Secondary | ICD-10-CM | POA: Diagnosis not present

## 2013-10-12 DIAGNOSIS — Z510 Encounter for antineoplastic radiation therapy: Secondary | ICD-10-CM | POA: Diagnosis not present

## 2013-10-12 DIAGNOSIS — C539 Malignant neoplasm of cervix uteri, unspecified: Secondary | ICD-10-CM | POA: Insufficient documentation

## 2013-10-13 ENCOUNTER — Ambulatory Visit: Payer: BC Managed Care – PPO

## 2013-10-13 ENCOUNTER — Ambulatory Visit
Admission: RE | Admit: 2013-10-13 | Discharge: 2013-10-13 | Disposition: A | Discharge status: Home / Self Care | Payer: BC Managed Care – PPO | Source: Ambulatory Visit | Attending: Radiation Oncology | Admitting: Radiation Oncology

## 2013-10-13 ENCOUNTER — Telehealth: Payer: Self-pay | Admitting: Oncology

## 2013-10-13 DIAGNOSIS — Z510 Encounter for antineoplastic radiation therapy: Secondary | ICD-10-CM | POA: Diagnosis not present

## 2013-10-13 NOTE — Telephone Encounter (Signed)
, °

## 2013-10-16 ENCOUNTER — Ambulatory Visit
Admission: RE | Admit: 2013-10-16 | Discharge: 2013-10-16 | Disposition: A | Discharge status: Home / Self Care | Payer: BC Managed Care – PPO | Source: Ambulatory Visit | Attending: Radiation Oncology | Admitting: Radiation Oncology

## 2013-10-16 ENCOUNTER — Ambulatory Visit: Payer: BC Managed Care – PPO

## 2013-10-16 DIAGNOSIS — Z510 Encounter for antineoplastic radiation therapy: Secondary | ICD-10-CM | POA: Diagnosis not present

## 2013-10-17 ENCOUNTER — Encounter: Payer: Self-pay | Admitting: Radiation Oncology

## 2013-10-17 ENCOUNTER — Inpatient Hospital Stay
Admission: RE | Admit: 2013-10-17 | Discharge: 2013-10-17 | Disposition: A | Discharge status: Home / Self Care | Payer: Self-pay | Source: Ambulatory Visit | Attending: Radiation Oncology | Admitting: Radiation Oncology

## 2013-10-17 ENCOUNTER — Ambulatory Visit: Payer: BC Managed Care – PPO

## 2013-10-17 ENCOUNTER — Ambulatory Visit
Admission: RE | Admit: 2013-10-17 | Discharge: 2013-10-17 | Disposition: A | Discharge status: Home / Self Care | Payer: BC Managed Care – PPO | Source: Ambulatory Visit | Attending: Radiation Oncology | Admitting: Radiation Oncology

## 2013-10-17 VITALS — BP 113/72 | HR 96 | Temp 98.0°F | Resp 12 | Ht 68.0 in | Wt 152.1 lb

## 2013-10-17 DIAGNOSIS — Z510 Encounter for antineoplastic radiation therapy: Secondary | ICD-10-CM | POA: Diagnosis not present

## 2013-10-17 DIAGNOSIS — C539 Malignant neoplasm of cervix uteri, unspecified: Secondary | ICD-10-CM

## 2013-10-17 NOTE — Progress Notes (Signed)
Gina Barnett has completed 5 fractions.  She reports pain in her vaginal area that she rates at a 5/10.  She is currently using a 125 mcg fentanyl patch and dilaudid 4 mg as needed.  She reports having diarrhea over the weekend.  She reports having nausea and is taking ativan and zofran.  Her weight is stable this week.  She last had chemotherapy on 10/11/13.  She denies having any bladder issues.  She reports noticing pink on the toilet tissue after urinating.  She has also noticed vaginal spotting.  She reports fatigue.  Reviewed side effects of radiation including fatigue, skin changes, nausea and bladder changes.

## 2013-10-17 NOTE — Progress Notes (Signed)
  Radiation Oncology         [phone] ________________________________  Name: Gina Barnett MRN: [id number]  Date: 10/17/2013  DOB: [birth year]  Weekly Radiation Therapy Management  Current Dose: 15 Gy     Planned Dose:  30 Gy  Narrative . . . . . . . . The patient presents for routine under treatment assessment.                                   The patient continues to have significant pain in the perineum which is her most bothersome area at this time.                                 Set-up films were reviewed.                                 The chart was checked. Physical Findings. . .  height is 5\' 8"  (1.727 m) and weight is 152 lb 1.6 oz (68.992 kg). Her oral temperature is 98 F (36.7 C). Her blood pressure is 113/72 and her pulse is 96. Her respiration is 12. . The lungs are clear. The heart has a regular rhythm and rate. the abdomen is soft and nontender with normal bowel sounds. Impression . . . . . . . The patient is tolerating radiation. Plan . . . . . . . . . . . . Continue treatment as planned.  ________________________________   Blair Promise, PhD, MD

## 2013-10-18 ENCOUNTER — Ambulatory Visit
Admission: RE | Admit: 2013-10-18 | Discharge: 2013-10-18 | Disposition: A | Discharge status: Home / Self Care | Payer: BC Managed Care – PPO | Source: Ambulatory Visit | Attending: Radiation Oncology | Admitting: Radiation Oncology

## 2013-10-18 ENCOUNTER — Ambulatory Visit: Payer: BC Managed Care – PPO

## 2013-10-18 DIAGNOSIS — Z510 Encounter for antineoplastic radiation therapy: Secondary | ICD-10-CM | POA: Diagnosis not present

## 2013-10-19 ENCOUNTER — Ambulatory Visit
Admission: RE | Admit: 2013-10-19 | Discharge: 2013-10-19 | Disposition: A | Discharge status: Home / Self Care | Payer: BC Managed Care – PPO | Source: Ambulatory Visit | Attending: Radiation Oncology | Admitting: Radiation Oncology

## 2013-10-19 ENCOUNTER — Ambulatory Visit: Payer: BC Managed Care – PPO

## 2013-10-19 ENCOUNTER — Ambulatory Visit: Payer: BC Managed Care – PPO | Admitting: Nutrition

## 2013-10-19 DIAGNOSIS — Z510 Encounter for antineoplastic radiation therapy: Secondary | ICD-10-CM | POA: Diagnosis not present

## 2013-10-19 NOTE — Progress Notes (Signed)
Nutrition followup completed with patient.  She is currently receiving radiation therapy.   Chemotherapy is on hold.  She is due to resume chemotherapy once radiation is complete.  Weight documented as 152.1 pounds on October 6.  Weight is generally stable over the past 6 weeks.   Patient has figured out what she can eat throughout the day and feels stronger since applying these strategies.  Patient does not consume milk.  She does use one can  Atkins shake and Atkins protein bar a day.   She is buying take out food for dinner, so that she does not need to cook. Patient reports bowel movements are controlled based on her medication.  She has increased pain, but she has control with medications.   She does report occasional nausea for which she takes Ativan and Zofran.  Nutrition diagnosis: Food and nutrition related knowledge deficit improved.  Intervention: Educated patient on strategies for increasing calories and spreading protein throughout the day.   Provided patient with a juice-based oral nutrition supplements to try.  She was given ordering information. Reviewed education on strategies for eating with nausea.  Provided a fact sheet. Questions were answered and teach back method used.  Monitoring, evaluation, goals: Patient will tolerate adequate calories and protein to promote weight maintenance.  Next visit: Patient has my contact information and will call me as needed.  **Disclaimer: This note was dictated with voice recognition software. Similar sounding words can inadvertently be transcribed and this note may contain transcription errors which may not have been corrected upon publication of note.**

## 2013-10-20 ENCOUNTER — Ambulatory Visit
Admission: RE | Admit: 2013-10-20 | Discharge: 2013-10-20 | Disposition: A | Discharge status: Home / Self Care | Payer: BC Managed Care – PPO | Source: Ambulatory Visit | Attending: Radiation Oncology | Admitting: Radiation Oncology

## 2013-10-20 ENCOUNTER — Ambulatory Visit: Payer: BC Managed Care – PPO

## 2013-10-20 DIAGNOSIS — Z510 Encounter for antineoplastic radiation therapy: Secondary | ICD-10-CM | POA: Diagnosis not present

## 2013-10-23 ENCOUNTER — Ambulatory Visit
Admission: RE | Admit: 2013-10-23 | Discharge: 2013-10-23 | Disposition: A | Discharge status: Home / Self Care | Payer: BC Managed Care – PPO | Source: Ambulatory Visit | Attending: Radiation Oncology | Admitting: Radiation Oncology

## 2013-10-23 ENCOUNTER — Ambulatory Visit: Payer: BC Managed Care – PPO

## 2013-10-23 DIAGNOSIS — Z510 Encounter for antineoplastic radiation therapy: Secondary | ICD-10-CM | POA: Diagnosis not present

## 2013-10-24 ENCOUNTER — Ambulatory Visit
Admission: RE | Admit: 2013-10-24 | Discharge: 2013-10-24 | Disposition: A | Discharge status: Home / Self Care | Payer: BC Managed Care – PPO | Source: Ambulatory Visit | Attending: Radiation Oncology | Admitting: Radiation Oncology

## 2013-10-24 ENCOUNTER — Encounter: Payer: Self-pay | Admitting: Radiation Oncology

## 2013-10-24 ENCOUNTER — Other Ambulatory Visit: Payer: Self-pay | Admitting: Oncology

## 2013-10-24 ENCOUNTER — Ambulatory Visit: Payer: BC Managed Care – PPO

## 2013-10-24 VITALS — BP 129/79 | HR 86 | Temp 97.7°F | Ht 68.0 in | Wt 148.4 lb

## 2013-10-24 DIAGNOSIS — Z510 Encounter for antineoplastic radiation therapy: Secondary | ICD-10-CM | POA: Diagnosis not present

## 2013-10-24 DIAGNOSIS — C539 Malignant neoplasm of cervix uteri, unspecified: Secondary | ICD-10-CM

## 2013-10-24 LAB — CBC WITH DIFFERENTIAL/PLATELET
BASO%: 0.2 % (ref 0.0–2.0)
Basophils Absolute: 0 10*3/uL (ref 0.0–0.1)
EOS%: 0.3 % (ref 0.0–7.0)
Eosinophils Absolute: 0 10*3/uL (ref 0.0–0.5)
HEMATOCRIT: 27.3 % — AB (ref 34.8–46.6)
HGB: 8.8 g/dL — ABNORMAL LOW (ref 11.6–15.9)
LYMPH%: 2.9 % — ABNORMAL LOW (ref 14.0–49.7)
MCH: 28.8 pg (ref 25.1–34.0)
MCHC: 32.3 g/dL (ref 31.5–36.0)
MCV: 89 fL (ref 79.5–101.0)
MONO#: 0.6 10*3/uL (ref 0.1–0.9)
MONO%: 5.9 % (ref 0.0–14.0)
NEUT#: 9 10*3/uL — ABNORMAL HIGH (ref 1.5–6.5)
NEUT%: 90.7 % — ABNORMAL HIGH (ref 38.4–76.8)
PLATELETS: 373 10*3/uL (ref 145–400)
RBC: 3.07 10*6/uL — ABNORMAL LOW (ref 3.70–5.45)
RDW: 15.9 % — ABNORMAL HIGH (ref 11.2–14.5)
WBC: 9.9 10*3/uL (ref 3.9–10.3)
lymph#: 0.3 10*3/uL — ABNORMAL LOW (ref 0.9–3.3)

## 2013-10-24 LAB — BASIC METABOLIC PANEL (CC13)
Anion Gap: 11 mEq/L (ref 3–11)
BUN: 10.4 mg/dL (ref 7.0–26.0)
CHLORIDE: 99 meq/L (ref 98–109)
CO2: 26 mEq/L (ref 22–29)
CREATININE: 1.7 mg/dL — AB (ref 0.6–1.1)
Calcium: 10.1 mg/dL (ref 8.4–10.4)
Glucose: 122 mg/dl (ref 70–140)
POTASSIUM: 4.1 meq/L (ref 3.5–5.1)
Sodium: 136 mEq/L (ref 136–145)

## 2013-10-24 NOTE — Progress Notes (Signed)
  Radiation Oncology         [phone] ________________________________  Name: Gina Barnett MRN: [id number]  Date: 10/24/2013  DOB: [birth year]  Weekly Radiation Therapy Management  Current Dose: 30 Gy     Planned Dose:  30 Gy  Narrative . . . . . . . . The patient presents for routine under treatment assessment.                                   The patient is having pain along the perineum where her tumor is located. Patient also complains of pain along the lower anterior abdominal wall. Patient did have some emesis recently x2. She is forcing fluids and is using MiraLax on regular basis to avoid constipation.  Blood work today shows slight increase in the patient's creatinine. She is mildly anemic and has a mild left shift which is been noticed on previous occasions                                 Set-up films were reviewed.                                 The chart was checked. Physical Findings. . .  height is 5\' 8"  (1.727 m) and weight is 148 lb 6.4 oz (67.314 kg). Her oral temperature is 97.7 F (36.5 C). Her blood pressure is 129/79 and her pulse is 86. . The perineal mass is unchanged in size at this time possibly even larger despite completion of treatment today at a high dose per fraction. Patient denies any pain in the suprapubic area and does not appear to have a distended bladder. She has firmness along the lower anterior abdominal wall, question tumor involvement in this area Impression . . . . . . . The patient is tolerating radiation. Plan . . . . . . . . . . . Marland Kitchen routine followup in one month. Patient will be seen by Dr. Marko Plume tomorrow and may in the near future resume chemotherapy  ________________________________   Blair Promise, PhD, MD

## 2013-10-24 NOTE — Progress Notes (Signed)
Gina Barnett has completed 10 fractions to her perineum.  She reports pain above her pubic bone that she is rating at a 10/10.  She said the pain is unrelenting and started on Friday.  She is using a 125 mcg fentanyl patch and is taking dilaudid 4 mg 1-2 tablets every 4 hours (7 tablets a day).  She reports having nausea and has vomited twice today.  She is using Zofran.  She has lost 4 lbs since 10/5.  She has not been able to eat today due to her pain.  She reports having diarrhea but is using miralax.  She reports having a pink vaginal discharge.  She reports burning with urination and is using cold compresses.  She reports skin irritation and burning in her vaginal area.  She reports fatigue and confusion from her dilaudid.  She is visibly weak today.

## 2013-10-25 ENCOUNTER — Other Ambulatory Visit (HOSPITAL_BASED_OUTPATIENT_CLINIC_OR_DEPARTMENT_OTHER): Payer: BC Managed Care – PPO

## 2013-10-25 ENCOUNTER — Telehealth: Payer: Self-pay | Admitting: Oncology

## 2013-10-25 ENCOUNTER — Ambulatory Visit: Payer: BC Managed Care – PPO

## 2013-10-25 ENCOUNTER — Ambulatory Visit (HOSPITAL_BASED_OUTPATIENT_CLINIC_OR_DEPARTMENT_OTHER): Payer: BC Managed Care – PPO | Admitting: Oncology

## 2013-10-25 ENCOUNTER — Encounter: Payer: Self-pay | Admitting: Oncology

## 2013-10-25 VITALS — BP 121/74 | HR 99 | Temp 98.4°F | Resp 20 | Ht 68.0 in | Wt 146.0 lb

## 2013-10-25 DIAGNOSIS — K269 Duodenal ulcer, unspecified as acute or chronic, without hemorrhage or perforation: Secondary | ICD-10-CM

## 2013-10-25 DIAGNOSIS — C539 Malignant neoplasm of cervix uteri, unspecified: Secondary | ICD-10-CM

## 2013-10-25 DIAGNOSIS — D649 Anemia, unspecified: Secondary | ICD-10-CM

## 2013-10-25 DIAGNOSIS — C7982 Secondary malignant neoplasm of genital organs: Secondary | ICD-10-CM

## 2013-10-25 DIAGNOSIS — N135 Crossing vessel and stricture of ureter without hydronephrosis: Secondary | ICD-10-CM

## 2013-10-25 DIAGNOSIS — C7989 Secondary malignant neoplasm of other specified sites: Secondary | ICD-10-CM

## 2013-10-25 LAB — CBC WITH DIFFERENTIAL/PLATELET
BASO%: 0.4 % (ref 0.0–2.0)
Basophils Absolute: 0 10*3/uL (ref 0.0–0.1)
EOS%: 0.4 % (ref 0.0–7.0)
Eosinophils Absolute: 0 10*3/uL (ref 0.0–0.5)
HEMATOCRIT: 28.5 % — AB (ref 34.8–46.6)
HGB: 9.1 g/dL — ABNORMAL LOW (ref 11.6–15.9)
LYMPH%: 3.5 % — AB (ref 14.0–49.7)
MCH: 28.8 pg (ref 25.1–34.0)
MCHC: 31.9 g/dL (ref 31.5–36.0)
MCV: 90.3 fL (ref 79.5–101.0)
MONO#: 0.6 10*3/uL (ref 0.1–0.9)
MONO%: 5.4 % (ref 0.0–14.0)
NEUT%: 90.3 % — ABNORMAL HIGH (ref 38.4–76.8)
NEUTROS ABS: 9.9 10*3/uL — AB (ref 1.5–6.5)
Platelets: 391 10*3/uL (ref 145–400)
RBC: 3.16 10*6/uL — AB (ref 3.70–5.45)
RDW: 15.8 % — ABNORMAL HIGH (ref 11.2–14.5)
WBC: 11 10*3/uL — AB (ref 3.9–10.3)
lymph#: 0.4 10*3/uL — ABNORMAL LOW (ref 0.9–3.3)

## 2013-10-25 LAB — COMPREHENSIVE METABOLIC PANEL (CC13)
ALBUMIN: 2.5 g/dL — AB (ref 3.5–5.0)
ANION GAP: 10 meq/L (ref 3–11)
AST: 6 U/L (ref 5–34)
Alkaline Phosphatase: 76 U/L (ref 40–150)
BUN: 9.6 mg/dL (ref 7.0–26.0)
CALCIUM: 10.3 mg/dL (ref 8.4–10.4)
CHLORIDE: 99 meq/L (ref 98–109)
CO2: 27 meq/L (ref 22–29)
Creatinine: 1.6 mg/dL — ABNORMAL HIGH (ref 0.6–1.1)
Glucose: 125 mg/dl (ref 70–140)
Potassium: 3.6 mEq/L (ref 3.5–5.1)
SODIUM: 136 meq/L (ref 136–145)
TOTAL PROTEIN: 7.1 g/dL (ref 6.4–8.3)
Total Bilirubin: 0.3 mg/dL (ref 0.20–1.20)

## 2013-10-25 MED ORDER — LORAZEPAM 1 MG PO TABS: ORAL_TABLET | ORAL | Status: DC

## 2013-10-25 MED ORDER — FENTANYL 50 MCG/HR TD PT72: 50.0000 ug | MEDICATED_PATCH | TRANSDERMAL | Status: DC

## 2013-10-25 MED ORDER — HYDROMORPHONE HCL 4 MG PO TABS: ORAL_TABLET | ORAL | Status: DC

## 2013-10-25 NOTE — Telephone Encounter (Signed)
per pof to sch pt appt-gave pt copy of sch °

## 2013-10-25 NOTE — Progress Notes (Signed)
OFFICE PROGRESS NOTE   10/25/2013   Physicians:D.ClarkePearson,J.Kinard, C.Melinda Crutch (PCP), [facility], Louis Meckel [facility] Urology), Ladona Horns. V.Schooler   INTERVAL HISTORY:   Patient is seen, together with brother and sister in law, in continuing attention to metastatic cervical cancer, having completed RT to perineum on 10-24-13. She had much worse pain including suprapubic area on 10-24-13, seen by Dr Sondra Come and eventually improved with additional dilaudid; she does continue duragesic, presently at 125 mcg/72 hrs. She is to see Dr Sondra Come for month follow up. Last chemo was weekly taxol on 9-16 and 9-23.  Pain seems mostly controlled today. She denies fever. PO intake is still rather poor, tho she is doing some better overall with this and is trying to drink fluids. She has had no increased bleeding. She is not SOB with present activity level. Bowels are moving. She had feraheme 10-04-13.  She does not have PAC. JJ stents per Dr Louis Meckel She has had flu vaccine  ONCOLOGIC HISTORY Patient presented with heavy vaginal bleeding, with low back pain and right hip pain. She was seen by Dr Dellis Filbert, with biopsies from upper vagina and lower anterior vagina showing invasive squamous cell carcinoma. 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 She saw Dr Josephina Shih 01-27-13, with cervix replaced by fungating tumor extending to both pelvic sidewalls and nodular lesion halfway down anterior vaginal wall. Right hip xray 01-27-13 showed no bony mets. PET 02-10-13 at [facility] had new right hydronephrosis in addition to the previous left hydronephrosis, uptake in para-aortic nodes, left iliac nodes, bulky cervical mass,  adenopathy adjacent to rectum; lungs and bones ok by PET. Bilateral JJ stents were placed 02-17-12. First CDDP was given 02-20-13. Chemo held 2-16 with acute pain and inadequate hydration; transfused 1 unit PRBCs 2-16 for Hgb 9.6. She had second CDDP on 03-10-13, then chemo held following week due to low counts. External beam RT was given 02-13-13 thru 03-17-13, 45 gray to pelvis and periaortics. She had progression in vagina and vulvar area by 06-2013, with brachytherapy given thru 07-25-13. She began weekly taxol, with plan to add carboplatin, then had additional RT to perineal progression in early 10-2013.  Review of systems as above, also: No swelling LE. No vomiting. The dilaudid, especially at 8 mg dose, makes her a little disoriented; I have told patient and family that she should not be staying alone if she needs the higher doses. No GERD or epigastric pain, no noted blood in stools. Remainder of 10 point Review of Systems negative.  Objective:  Vital signs in last 24 hours:  BP 121/74  Pulse 99  Temp(Src) 98.4 F (36.9 C) (Oral)  Resp 20  Ht 5\' 8"  (1.727 m)  Wt 146 lb (66.225 kg)  BMI 22.20 kg/m2  LMP 03/11/2013  Weight down 2 lbs  Alert, oriented and appropriate. Ambulatory slowly without assistance.  No alopecia  HEENT:PERRL, sclerae not icteric. Oral mucosa moist without lesions, posterior pharynx clear.  Neck supple. No JVD.  Lymphatics:no cervical,supraclavicular adenopathy Resp: clear to auscultation bilaterally and normal percussion bilaterally Cardio: tachy, regular rate and rhythm. No gallop.Clear heart sounds GI: soft, nontender, not distended, no mass or organomegaly. Some bowel sounds. Firm mass at mons no larger, no skin breakdown.  Musculoskeletal/ Extremities: without pitting edema, cords,  tenderness Neuro: no peripheral neuropathy. Otherwise nonfocal Skin without rash, ecchymosis, petechiae Portacath-without erythema or tenderness  Lab Results:  Results for orders  placed in visit on 10/25/13  CBC WITH DIFFERENTIAL      Result Value Ref Range   WBC 11.0 (*) 3.9 - 10.3 10e3/uL   NEUT# 9.9 (*) 1.5 - 6.5 10e3/uL   HGB 9.1 (*) 11.6 - 15.9 g/dL   HCT 28.5 (*) 34.8 - 46.6 %   Platelets 391  145 - 400 10e3/uL   MCV 90.3  79.5 - 101.0 fL   MCH 28.8  25.1 - 34.0 pg   MCHC 31.9  31.5 - 36.0 g/dL   RBC 3.16 (*) 3.70 - 5.45 10e6/uL   RDW 15.8 (*) 11.2 - 14.5 %   lymph# 0.4 (*) 0.9 - 3.3 10e3/uL   MONO# 0.6  0.1 - 0.9 10e3/uL   Eosinophils Absolute 0.0  0.0 - 0.5 10e3/uL   Basophils Absolute 0.0  0.0 - 0.1 10e3/uL   NEUT% 90.3 (*) 38.4 - 76.8 %   LYMPH% 3.5 (*) 14.0 - 49.7 %   MONO% 5.4  0.0 - 14.0 %   EOS% 0.4  0.0 - 7.0 %   BASO% 0.4  0.0 - 2.0 %  COMPREHENSIVE METABOLIC PANEL (KD59)      Result Value Ref Range   Sodium 136  136 - 145 mEq/L   Potassium 3.6  3.5 - 5.1 mEq/L   Chloride 99  98 - 109 mEq/L   CO2 27  22 - 29 mEq/L   Glucose 125  70 - 140 mg/dl   BUN 9.6  7.0 - 26.0 mg/dL   Creatinine 1.6 (*) 0.6 - 1.1 mg/dL   Total Bilirubin 0.30  0.20 - 1.20 mg/dL   Alkaline Phosphatase 76  40 - 150 U/L   AST 6  5 - 34 U/L   ALT <6  0 - 55 U/L   Total Protein 7.1  6.4 - 8.3 g/dL   Albumin 2.5 (*) 3.5 - 5.0 g/dL   Calcium 10.3  8.4 - 10.4 mg/dL   Anion Gap 10  3 - 11 mEq/L     Studies/Results:  No results found.  Medications: I have reviewed the patient's current medications. Will increase fentanyl to 150 mcg/72 hrs and continue prn dilaudid. Continue Protonix bid  DISCUSSION: Medications discussed as above. Did not discuss avastin. After visit I followed up with Research, and learned that nivolumab study closed prior to completing our IRB, due to full accrual already.There may be additional study with this drug in next 3-6 months.  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 Will resume chemo with  carboplatin + weekly taxol if stable in another 1-2 weeks. Consider adding avastin. Increase duragesic to 150 now. 2.duodenal ulcer: cratered but not actively bleeding at upper endoscopy 09-14-13, symptoms much better with bid Protonix. Trying to avoid low platelets and not using po decadron with this chemo.  3.JJ stents for obstruction from cervical cancer. Followed by Dr Louis Meckel, due change upcoming.  4.weight loss and poor nutritional status in setting of chronic illness, and social concerns: I would still appreciate nutritionist and chaplain follow up  5.anemia related to iron deficiency, pelvic RT, previous chemo, chronic illness. IV feraheme given 10-04-13 and ok to be off oral iron. Slight improvement today. She has not had PRBCs since Feb 2015. 6.Living Will and HCPOA in place  7.flu vaccine done 8.renal insufficiency: no dramatic changes, multifactorial with obstruction and difficult po intake.  All questions answered. I will see her in ~ 10 days and schedule chemo from there if appropriate.    LIVESAY,LENNIS P, MD   10/25/2013, 5:50 PM

## 2013-11-01 ENCOUNTER — Telehealth: Payer: Self-pay | Admitting: Oncology

## 2013-11-01 ENCOUNTER — Telehealth: Payer: Self-pay

## 2013-11-01 DIAGNOSIS — C539 Malignant neoplasm of cervix uteri, unspecified: Secondary | ICD-10-CM

## 2013-11-01 DIAGNOSIS — N135 Crossing vessel and stricture of ureter without hydronephrosis: Secondary | ICD-10-CM

## 2013-11-01 MED ORDER — HYDROMORPHONE HCL 4 MG PO TABS: ORAL_TABLET | ORAL | Status: DC

## 2013-11-01 NOTE — Telephone Encounter (Signed)
, °

## 2013-11-01 NOTE — Telephone Encounter (Signed)
Ms. Sand called stating that she needed a refill on her Dilaudid 4 mg tabs.  She is using 6-8 tabs in a 24 hr period. She has 17 tabs left.  Her Duragesic patch is at 150 mcg every 72 hrs.  Her pain level is a 5-10/10.  Most of the time it is 8-10/10.  She has  Duragesic 50 and 25 mcg patches on hand. Told Ms. Monteleone that Dr. Marko Plume said to go up to 200 mcg on the Duragesic patch and will give her a prescription for dilaudid 4 mg tabs # 80 with same direction of 1-2 q 4 hrs prn pain. She can call back to the office sooner that her f/u visit on 11-06-13 if her pain is not decreasing.  Patient verbalized understanding.

## 2013-11-05 ENCOUNTER — Other Ambulatory Visit: Payer: Self-pay | Admitting: Oncology

## 2013-11-05 DIAGNOSIS — C539 Malignant neoplasm of cervix uteri, unspecified: Secondary | ICD-10-CM

## 2013-11-06 ENCOUNTER — Ambulatory Visit: Payer: BC Managed Care – PPO | Admitting: Oncology

## 2013-11-06 ENCOUNTER — Telehealth: Payer: Self-pay | Admitting: Oncology

## 2013-11-06 ENCOUNTER — Encounter: Payer: Self-pay | Admitting: Oncology

## 2013-11-06 ENCOUNTER — Other Ambulatory Visit (HOSPITAL_BASED_OUTPATIENT_CLINIC_OR_DEPARTMENT_OTHER): Payer: BC Managed Care – PPO

## 2013-11-06 ENCOUNTER — Other Ambulatory Visit: Payer: BC Managed Care – PPO

## 2013-11-06 ENCOUNTER — Ambulatory Visit (HOSPITAL_BASED_OUTPATIENT_CLINIC_OR_DEPARTMENT_OTHER): Payer: BC Managed Care – PPO | Admitting: Oncology

## 2013-11-06 VITALS — BP 120/76 | HR 108 | Temp 98.6°F | Resp 18 | Ht 68.0 in | Wt 144.6 lb

## 2013-11-06 DIAGNOSIS — R634 Abnormal weight loss: Secondary | ICD-10-CM

## 2013-11-06 DIAGNOSIS — D509 Iron deficiency anemia, unspecified: Secondary | ICD-10-CM

## 2013-11-06 DIAGNOSIS — C539 Malignant neoplasm of cervix uteri, unspecified: Secondary | ICD-10-CM

## 2013-11-06 DIAGNOSIS — C7989 Secondary malignant neoplasm of other specified sites: Secondary | ICD-10-CM | POA: Insufficient documentation

## 2013-11-06 DIAGNOSIS — G893 Neoplasm related pain (acute) (chronic): Secondary | ICD-10-CM

## 2013-11-06 LAB — CBC WITH DIFFERENTIAL/PLATELET
BASO%: 0.3 % (ref 0.0–2.0)
Basophils Absolute: 0 10*3/uL (ref 0.0–0.1)
EOS%: 2.6 % (ref 0.0–7.0)
Eosinophils Absolute: 0.3 10*3/uL (ref 0.0–0.5)
HEMATOCRIT: 28.7 % — AB (ref 34.8–46.6)
HGB: 8.9 g/dL — ABNORMAL LOW (ref 11.6–15.9)
LYMPH%: 4.5 % — ABNORMAL LOW (ref 14.0–49.7)
MCH: 28 pg (ref 25.1–34.0)
MCHC: 31.1 g/dL — AB (ref 31.5–36.0)
MCV: 90 fL (ref 79.5–101.0)
MONO#: 0.7 10*3/uL (ref 0.1–0.9)
MONO%: 6.2 % (ref 0.0–14.0)
NEUT#: 9.9 10*3/uL — ABNORMAL HIGH (ref 1.5–6.5)
NEUT%: 86.4 % — ABNORMAL HIGH (ref 38.4–76.8)
Platelets: 387 10*3/uL (ref 145–400)
RBC: 3.18 10*6/uL — AB (ref 3.70–5.45)
RDW: 16.2 % — AB (ref 11.2–14.5)
WBC: 11.4 10*3/uL — AB (ref 3.9–10.3)
lymph#: 0.5 10*3/uL — ABNORMAL LOW (ref 0.9–3.3)

## 2013-11-06 LAB — COMPREHENSIVE METABOLIC PANEL (CC13)
AST: 7 U/L (ref 5–34)
Albumin: 2.6 g/dL — ABNORMAL LOW (ref 3.5–5.0)
Alkaline Phosphatase: 72 U/L (ref 40–150)
Anion Gap: 8 mEq/L (ref 3–11)
BUN: 10.5 mg/dL (ref 7.0–26.0)
CALCIUM: 10.3 mg/dL (ref 8.4–10.4)
CO2: 29 mEq/L (ref 22–29)
CREATININE: 1.6 mg/dL — AB (ref 0.6–1.1)
Chloride: 97 mEq/L — ABNORMAL LOW (ref 98–109)
Glucose: 123 mg/dl (ref 70–140)
Potassium: 4.4 mEq/L (ref 3.5–5.1)
Sodium: 133 mEq/L — ABNORMAL LOW (ref 136–145)
TOTAL PROTEIN: 7.1 g/dL (ref 6.4–8.3)
Total Bilirubin: 0.37 mg/dL (ref 0.20–1.20)

## 2013-11-06 NOTE — Telephone Encounter (Signed)
m, °

## 2013-11-06 NOTE — Progress Notes (Signed)
OFFICE PROGRESS NOTE   11/06/2013   Physicians:D.ClarkePearson,J.Kinard, C.Melinda Crutch (PCP), [facility], Louis Meckel [facility]), Ladona Horns. V.Schooler   INTERVAL HISTORY:  Patient is seen, together with friend Aimee, in continuing attention to cervical carcinoma metastatic in pelvis and to perineal and pubic soft tissues, palliative RT completed 10-24-13. She is still having significant local pain, requiring dilaudid 8 mg every 4 hours despite increase in duragesic to 200 mcg every 72 hrs on 11-01-13. Last chemo was #2 weekly taxol on 10-04-13.   Pain is suprapubic/ pubic thru to back. Bowels are moving intermittently with miralax bid and MOM. She had a better day on 10-24 and was able to sleep that night. PO intake is poor, tho she continues to try. She does not have frank epigastric pain and no obvious GI bleeding from gastric ulcer. She is mostly in recliner, does get up to care for her 2 cats and does not want to be in hospital or leave home because of the cats.    She does not have PAC.  JJ stents per Dr Louis Meckel  She has had flu vaccine  She is letting family stay with her when they do not feel she can be alone.  ONCOLOGIC HISTORY Patient presented with heavy vaginal bleeding, with low back pain and right hip pain. She was seen by Dr Dellis Filbert, with biopsies from upper vagina and lower anterior vagina showing invasive squamous cell carcinoma. 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 She saw Dr Josephina Shih 01-27-13, with cervix replaced by fungating tumor extending to both pelvic sidewalls and nodular lesion halfway down anterior vaginal wall. Right hip xray 01-27-13 showed no bony mets. PET  02-10-13 at [facility] had new right hydronephrosis in addition to the previous left hydronephrosis, uptake in para-aortic nodes, left iliac nodes, bulky cervical mass, adenopathy adjacent to rectum; lungs and bones ok by PET. Bilateral JJ stents were placed 02-17-12. First CDDP was given 02-20-13. Chemo held 2-16 with acute pain and inadequate hydration; transfused 1 unit PRBCs 2-16 for Hgb 9.6. She had second CDDP on 03-10-13, then chemo held following week due to low counts. External beam RT was given 02-13-13 thru 03-17-13, 45 gray to pelvis and periaortics. She had progression in vagina and vulvar area by 06-2013, with brachytherapy given thru 07-25-13. She began weekly taxol, with plan to add carboplatin, then had additional RT to perineal progression in early 10-2013.   Review of systems as above, also: No fever. No frank dysuria, is able to void. No LE swelling. No increased SOB.  Remainder of 10 point Review of Systems negative.  Objective:  Vital signs in last 24 hours:  BP 120/76  Pulse 108  Temp(Src) 98.6 F (37 C) (Oral)  Resp 18  Ht 5\' 8"  (1.727 m)  Wt 144 lb 9.6 oz (65.59 kg)  BMI 21.99 kg/m2  LMP 03/11/2013 Weight is down 1.5 lbs from 10-25-13. Alert, oriented, appropriate, still always pleasant and smiling at times. Ambulatory without assistance    HEENT:PERRL, sclerae not icteric. Oral mucosa somewhat dry without lesions, posterior pharynx clear.  Neck supple. No JVD.  Lymphatics:no cervical,supaclavicular adenopathy Resp: clear to auscultation bilaterally and normal percussion bilaterally Cardio: regular rate and rhythm. No gallop. GI: soft, nontender, not distended, no mass or organomegaly.  Minimal bowel sounds.  Tumor mass at mons is 1/3 - 1/2 smaller than at my last exam, still seems fixed, no skin breakdown, not as extremely tender to gentle exam.. Musculoskeletal/ Extremities: without pitting edema, cords, tenderness Neuro: no peripheral neuropathy. Otherwise nonfocal. PSYCH  appropriate mood and affect. Writes notes on her calendar re time of duragesic change. Skin without rash, ecchymosis, petechiae   Lab Results:  Results for orders placed in visit on 11/06/13  CBC WITH DIFFERENTIAL      Result Value Ref Range   WBC 11.4 (*) 3.9 - 10.3 10e3/uL   NEUT# 9.9 (*) 1.5 - 6.5 10e3/uL   HGB 8.9 (*) 11.6 - 15.9 g/dL   HCT 28.7 (*) 34.8 - 46.6 %   Platelets 387  145 - 400 10e3/uL   MCV 90.0  79.5 - 101.0 fL   MCH 28.0  25.1 - 34.0 pg   MCHC 31.1 (*) 31.5 - 36.0 g/dL   RBC 3.18 (*) 3.70 - 5.45 10e6/uL   RDW 16.2 (*) 11.2 - 14.5 %   lymph# 0.5 (*) 0.9 - 3.3 10e3/uL   MONO# 0.7  0.1 - 0.9 10e3/uL   Eosinophils Absolute 0.3  0.0 - 0.5 10e3/uL   Basophils Absolute 0.0  0.0 - 0.1 10e3/uL   NEUT% 86.4 (*) 38.4 - 76.8 %   LYMPH% 4.5 (*) 14.0 - 49.7 %   MONO% 6.2  0.0 - 14.0 %   EOS% 2.6  0.0 - 7.0 %   BASO% 0.3  0.0 - 2.0 %  COMPREHENSIVE METABOLIC PANEL (ST41)      Result Value Ref Range   Sodium 133 (*) 136 - 145 mEq/L   Potassium 4.4  3.5 - 5.1 mEq/L   Chloride 97 (*) 98 - 109 mEq/L   CO2 29  22 - 29 mEq/L   Glucose 123  70 - 140 mg/dl   BUN 10.5  7.0 - 26.0 mg/dL   Creatinine 1.6 (*) 0.6 - 1.1 mg/dL   Total Bilirubin 0.37  0.20 - 1.20 mg/dL   Alkaline Phosphatase 72  40 - 150 U/L   AST 7  5 - 34 U/L   ALT <6  0 - 55 U/L   Total Protein 7.1  6.4 - 8.3 g/dL   Albumin 2.6 (*) 3.5 - 5.0 g/dL   Calcium 10.3  8.4 - 10.4 mg/dL   Anion Gap 8  3 - 11 mEq/L     Studies/Results:  No results found.  Medications: I have reviewed the patient's current medications. She will increase duragesic to 250 mcg/72 hours now and try the lidoderm patch. Consider adding gabapentin if still very uncomfortable when she speaks with RN on 11-08-13  DISCUSSION She is not able to proceed with more chemo until/ unless rest of situation improves. We have discussed getting PAC placed, which would allow IV pain medication if symptoms cannot be controlled otherwise; after  discussion, she is in agreement with PAC. She has not tried lidoderm to pubic area, but is willing to do that now.  Not discussed today, nivolumab study is already closed due to completed enrollment. There may be another nivolumab study in next few months.    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 Increase duragesic to 250 mcg now, other interventions as above. I will see her back on 11-9. PAC requested, as I expect she  will need dilaudid qtt for pain management. Holding chemo, not clear if will be able to continue this. 2.duodenal ulcer: cratered but not actively bleeding at upper endoscopy 09-14-13, symptoms much better with bid Protonix.  3.JJ stents for obstruction from cervical cancer. Followed by Dr Louis Meckel, due change upcoming.  4.weight loss and poor nutritional status in setting of chronic illness, and social concerns: patient did not want to talk with nutritionist, but is letting sister in law Engineer, petroleum, Therapist, sports)  and Aimee Investment banker, corporate) assist more. 5.anemia related to iron deficiency, pelvic RT, previous chemo, chronic illness. IV feraheme given 10-04-13  6.Living Will and HCPOA in place  7.flu vaccine done  8.renal insufficiency: no dramatic changes, multifactorial with obstruction and difficult po intake.   Appreciate Dr Clabe Seal assistance with recent RT. Patient to call this office on 10-28 to let us know how she is with increase in duragesic and lidoderm.    Gordy Levan, MD   11/06/2013, 4:41 PM

## 2013-11-07 ENCOUNTER — Telehealth: Payer: Self-pay | Admitting: *Deleted

## 2013-11-07 ENCOUNTER — Other Ambulatory Visit: Payer: Self-pay | Admitting: *Deleted

## 2013-11-07 ENCOUNTER — Encounter: Payer: Self-pay | Admitting: Nutrition

## 2013-11-07 DIAGNOSIS — C539 Malignant neoplasm of cervix uteri, unspecified: Secondary | ICD-10-CM

## 2013-11-07 DIAGNOSIS — N135 Crossing vessel and stricture of ureter without hydronephrosis: Secondary | ICD-10-CM

## 2013-11-07 MED ORDER — FENTANYL 50 MCG/HR TD PT72: 50.0000 ug | MEDICATED_PATCH | TRANSDERMAL | Status: DC

## 2013-11-07 MED ORDER — FENTANYL 100 MCG/HR TD PT72: 200.0000 ug | MEDICATED_PATCH | TRANSDERMAL | Status: DC

## 2013-11-07 MED ORDER — HYDROMORPHONE HCL 4 MG PO TABS: ORAL_TABLET | ORAL | Status: DC

## 2013-11-07 NOTE — Telephone Encounter (Signed)
Message copied by Christa See on Tue Nov 07, 2013  3:04 PM ------      Message from: Gordy Levan      Created: Mon Nov 06, 2013 10:04 PM       She is to call RN on Wed. 10-28 to let us know how she is with duragesic increased to 250. I think she will likely need more dilaudid by then, and likely will need more duragesic before I see her again (put on one 50 mcg patch today 10-26 and had 2 of the 50s + some 100s left)            thanks ------

## 2013-11-07 NOTE — Telephone Encounter (Signed)
Pt called this morning and left VM stating she has enough fentanyl patches left for 6 days. Called pt back to see how she was doing today. She states "I think my pain is a little better." She said she has enough dilaudid tablets for the next few days as well. Told pt that we will refill both her fentanyl patches and dilaudid tablets and that we will call her once the script is ready to pick up.

## 2013-11-07 NOTE — Progress Notes (Signed)
Nutrition follow up scheduled at request of Dr. Marko Plume in progress note. Patient had nutrition appointment scheduled however, patient told RN she didn't really need nutrition follow up at this time. RD cancelled nutrition follow up. Patient has my contact information for questions.

## 2013-11-08 ENCOUNTER — Telehealth: Payer: Self-pay | Admitting: *Deleted

## 2013-11-08 NOTE — Telephone Encounter (Signed)
Spoke with Gina Barnett this morning and she states she is feeling much better today. She states she got up early and did more things around the house than she has been doing recently. Overall she states "things have improved." Told pt I will place three prescriptions (Fentanyl patches 21mcg and 178mcg and dilaudid tablets) in the binder in the injection room. Pt states she has picked up scripts before so is aware of where to go and how to get them  - said her friend Leota Sauers will probably pick them up.  told. Told her that is fine - just make sure she bring her photo ID. Scripts placed in binder in infusion room.

## 2013-11-08 NOTE — Telephone Encounter (Signed)
Message copied by Christa See on Wed Nov 08, 2013 10:36 AM ------      Message from: Gordy Levan      Created: Tue Nov 07, 2013 11:33 AM       If she has enough until tomorrow, best if I sign the scripts when I am in office tomorrow.      She just increased duragesic ~ 4 PM on 10-26, so may not be able to tell all improvement from this yet.            Dilaudid 4 mg 1-2 every 4 hrs prn pain # 100      Duragesic probably will be 250 q 72 hrs,  #10 of the 100s and #5 of the 50s if so      I want to add gabapentin 100 mg bid if still a lot of pain by 10-27            thanks      ----- Message -----         From: Christa See, RN         Sent: 11/07/2013  11:28 AM           To: Gordy Levan, MD            Spoke with Vaughan Basta. States pain is a little better but she is having to take 8mg  dilaudid q 4hrs still. She will need RF on Dilauidid as well. She was asking for at least a 10 day supply so friend Amiee doesn't have to come back and forth so often for scripts.             How many days would you like me to fill for both Dilaudid 4mg  tabs and Fentanyl patches?            ----- Message -----         From: Gordy Levan, MD         Sent: 11/06/2013  10:04 PM           To: Baruch Merl, RN, Christa See, RN            She is to call RN on Wed. 10-28 to let us know how she is with duragesic increased to 250. I think she will likely need more dilaudid by then, and likely will need more duragesic before I see her again (put on one 50 mcg patch today 10-26 and had 2 of the 50s + some 100s left)            thanks             ------

## 2013-11-09 ENCOUNTER — Other Ambulatory Visit: Payer: Self-pay | Admitting: Urology

## 2013-11-09 ENCOUNTER — Encounter: Payer: BC Managed Care – PPO | Admitting: Nutrition

## 2013-11-12 ENCOUNTER — Encounter: Payer: Self-pay | Admitting: Radiation Oncology

## 2013-11-12 NOTE — Progress Notes (Signed)
  Radiation Oncology         [phone] ________________________________  Name: Gina Barnett MRN: [id number]  Date: 11/12/2013  DOB: 01/26/1955  End of Treatment Note  Diagnosis:   Recurrent cervical cancer     Indication for treatment:  Tumor nodule in the perineum and associated pain       Radiation treatment dates:   September 30 through October 13  Site/dose:   Perineal nodule 30 gray in 10 fractions  Beams/energy:   3-D conformal using a left anterior oblique and right anterior oblique field,  6 megavoltage photons  Narrative: The patient tolerated radiation treatment relatively well.   She had not experienced any improvement in her pain at the completion of treatment. Her nodule had decreased slightly in size  Plan: The patient has completed radiation treatment. The patient will return to radiation oncology clinic for routine followup in one month. I advised them to call or return sooner if they have any questions or concerns related to their recovery or treatment.  -----------------------------------  Blair Promise, PhD, MD

## 2013-11-13 ENCOUNTER — Telehealth: Payer: Self-pay

## 2013-11-13 NOTE — Telephone Encounter (Signed)
Since insurance will not pay for the 50 mcg Duragesic patch until 11-24-13.  Gina Barnett will use [phone])100 mcg patches beginning on 11-7-15per Dr. Marko Plume , which will be an increase of 50 mcg in dose.  Pt. Continuing with Dilaudid for breakthrough pain med. Gina Barnett rather use up all her patches and get a new prescription for all strengths needed at one time.  Pt. To see Dr. Marko Plume on 11-20-13.

## 2013-11-16 ENCOUNTER — Telehealth: Payer: Self-pay

## 2013-11-16 NOTE — Telephone Encounter (Signed)
Spoke with sister-in-law Patty regarding Sacheen's increased fatigue, nausea,vomiting.  Taffie was present in the room with Patty. Ms. Zynda is staying at her brother's house currently in just [place]. Elynore and her family are not sure if she needs some IVF since her intake is decreasing.  Her pain level remains ~6/10.   She is not having severe abd pain or projectile vomiting as this coulb be symptoms of bowel obstruction.  If this occurs suggested that Patty bring Gazella to [facility] ED. Discussed Hospice referral and reviewed  benefits of this care with Patty.  Kalila aware of conversation as well. Told Patty that Dr. Marko Plume feels a a hospice referral is appropriate at this time if they want one. Isma and her family will discuss Hospice referral and decide if she would like to come in for IVF, Zofran, and Pepcid tomorrow. Told Patty to call office ~0900 if Simrin wants IVF as appointment will need to be made.

## 2013-11-17 ENCOUNTER — Other Ambulatory Visit: Payer: Self-pay | Admitting: Radiology

## 2013-11-18 ENCOUNTER — Other Ambulatory Visit: Payer: Self-pay | Admitting: Oncology

## 2013-11-20 ENCOUNTER — Ambulatory Visit (HOSPITAL_BASED_OUTPATIENT_CLINIC_OR_DEPARTMENT_OTHER): Payer: BC Managed Care – PPO | Admitting: Oncology

## 2013-11-20 ENCOUNTER — Telehealth: Payer: Self-pay | Admitting: Oncology

## 2013-11-20 ENCOUNTER — Telehealth: Payer: Self-pay

## 2013-11-20 ENCOUNTER — Encounter: Payer: Self-pay | Admitting: Oncology

## 2013-11-20 ENCOUNTER — Other Ambulatory Visit (HOSPITAL_BASED_OUTPATIENT_CLINIC_OR_DEPARTMENT_OTHER): Payer: BC Managed Care – PPO

## 2013-11-20 VITALS — BP 100/68 | HR 97 | Temp 98.3°F | Resp 18 | Ht 68.0 in | Wt 139.4 lb

## 2013-11-20 DIAGNOSIS — D638 Anemia in other chronic diseases classified elsewhere: Secondary | ICD-10-CM

## 2013-11-20 DIAGNOSIS — C539 Malignant neoplasm of cervix uteri, unspecified: Secondary | ICD-10-CM

## 2013-11-20 DIAGNOSIS — N289 Disorder of kidney and ureter, unspecified: Secondary | ICD-10-CM

## 2013-11-20 DIAGNOSIS — K269 Duodenal ulcer, unspecified as acute or chronic, without hemorrhage or perforation: Secondary | ICD-10-CM

## 2013-11-20 DIAGNOSIS — D509 Iron deficiency anemia, unspecified: Secondary | ICD-10-CM

## 2013-11-20 DIAGNOSIS — N135 Crossing vessel and stricture of ureter without hydronephrosis: Secondary | ICD-10-CM

## 2013-11-20 DIAGNOSIS — D630 Anemia in neoplastic disease: Secondary | ICD-10-CM

## 2013-11-20 DIAGNOSIS — C778 Secondary and unspecified malignant neoplasm of lymph nodes of multiple regions: Secondary | ICD-10-CM

## 2013-11-20 LAB — COMPREHENSIVE METABOLIC PANEL (CC13)
AST: 7 U/L (ref 5–34)
Albumin: 2.7 g/dL — ABNORMAL LOW (ref 3.5–5.0)
Alkaline Phosphatase: 72 U/L (ref 40–150)
Anion Gap: 12 mEq/L — ABNORMAL HIGH (ref 3–11)
BUN: 11.6 mg/dL (ref 7.0–26.0)
CO2: 28 mEq/L (ref 22–29)
Calcium: 10.4 mg/dL (ref 8.4–10.4)
Chloride: 93 mEq/L — ABNORMAL LOW (ref 98–109)
Creatinine: 1.8 mg/dL — ABNORMAL HIGH (ref 0.6–1.1)
Glucose: 136 mg/dl (ref 70–140)
Potassium: 4.8 mEq/L (ref 3.5–5.1)
Sodium: 134 mEq/L — ABNORMAL LOW (ref 136–145)
Total Bilirubin: 0.37 mg/dL (ref 0.20–1.20)
Total Protein: 7.2 g/dL (ref 6.4–8.3)

## 2013-11-20 LAB — CBC WITH DIFFERENTIAL/PLATELET
BASO%: 0.2 % (ref 0.0–2.0)
Basophils Absolute: 0 10*3/uL (ref 0.0–0.1)
EOS%: 2.2 % (ref 0.0–7.0)
Eosinophils Absolute: 0.2 10*3/uL (ref 0.0–0.5)
HEMATOCRIT: 28.6 % — AB (ref 34.8–46.6)
HEMOGLOBIN: 9 g/dL — AB (ref 11.6–15.9)
LYMPH#: 0.6 10*3/uL — AB (ref 0.9–3.3)
LYMPH%: 5.8 % — ABNORMAL LOW (ref 14.0–49.7)
MCH: 28 pg (ref 25.1–34.0)
MCHC: 31.5 g/dL (ref 31.5–36.0)
MCV: 89.1 fL (ref 79.5–101.0)
MONO#: 0.6 10*3/uL (ref 0.1–0.9)
MONO%: 5.8 % (ref 0.0–14.0)
NEUT#: 9 10*3/uL — ABNORMAL HIGH (ref 1.5–6.5)
NEUT%: 86 % — AB (ref 38.4–76.8)
Platelets: 303 10*3/uL (ref 145–400)
RBC: 3.21 10*6/uL — ABNORMAL LOW (ref 3.70–5.45)
RDW: 15.2 % — AB (ref 11.2–14.5)
WBC: 10.5 10*3/uL — ABNORMAL HIGH (ref 3.9–10.3)

## 2013-11-20 MED ORDER — PANTOPRAZOLE SODIUM 40 MG PO TBEC: 40.0000 mg | DELAYED_RELEASE_TABLET | Freq: Two times a day (BID) | ORAL | Status: AC

## 2013-11-20 MED ORDER — HYDROMORPHONE HCL 4 MG PO TABS: ORAL_TABLET | ORAL | Status: AC

## 2013-11-20 MED ORDER — FENTANYL 100 MCG/HR TD PT72: 300.0000 ug | MEDICATED_PATCH | TRANSDERMAL | Status: AC

## 2013-11-20 MED ORDER — LORAZEPAM 1 MG PO TABS: ORAL_TABLET | ORAL | Status: AC

## 2013-11-20 MED ORDER — ONDANSETRON HCL 8 MG PO TABS: 8.0000 mg | ORAL_TABLET | Freq: Two times a day (BID) | ORAL | Status: AC | PRN

## 2013-11-20 NOTE — Telephone Encounter (Signed)
per pof to sch appt-gave pt copy of sch °

## 2013-11-20 NOTE — Progress Notes (Signed)
OFFICE PROGRESS NOTE   11/20/2013   Physicians:D.ClarkePearson,J.Kinard, C.Melinda Crutch (PCP), [facility], Louis Meckel [facility]), Ladona Horns. V.Schooler  INTERVAL HISTORY:  Patient is seen, together with brother Jenny Reichmann, in continuing attention to extensively metastatic cervical cancer and associated significant pain and other symptoms. Last treatment was radiation to pelvic area 9-30 thru 10-24-13. Performance status is too poor to attempt further chemotherapy.  As recommended, patient increased duragesic to 300 mcg beginning 11-18-13, with improvement in pain such that she was able to sleep last pm. She tells me that she has found homes for her 2 cats and plans to stay with brother and sister in law in [place] now. She and family have discussed Hospice referral, which they feel would be helpful [facility]). She and brother went to funeral home prior to visit today to make her arrangements, and she plans to complete banking needs.  Otherwise she has had intermittent vomiting, including after meals that she has enjoyed. She had 3 bowel movements on 11-16-13, but does not seem to have taken any miralax or other laxatives since then. I have reminded patient and brother that she needs to use laxatives daily due to ongoing narcotics. She denies bleeding or increased SOB.  She will have PAC placed on 11-21-13, which can be used for IV dilaudid infusion or other IV symptom management needs. She has JJ stents by Dr Louis Meckel Flu vaccine done. She had feraheme 09-2013.     ONCOLOGIC HISTORY Patient presented with heavy vaginal bleeding, with low back pain and right hip pain. She was seen by Dr Dellis Filbert, with biopsies from upper vagina and lower anterior vagina showing invasive squamous cell carcinoma. She had CT CAP in [facility] 01-25-13 which showed necrotic mass centered in lower uterine segment and cervix 10.8 x 8.7 cm, left hydronephrosis, pelvic and retroperitoneal node involvement,  rectum and sigmoid intimately associated with pelvic mass but without obstruction, advanced degenerative disc disease at lumbosacral junction. She saw Dr Josephina Shih 01-27-13, with cervix replaced by fungating tumor extending to both pelvic sidewalls and nodular lesion halfway down anterior vaginal wall. Right hip xray 01-27-13 showed no bony mets. PET 02-10-13 at [facility] had new right hydronephrosis in addition to the previous left hydronephrosis, uptake in para-aortic nodes, left iliac nodes, bulky cervical mass, adenopathy adjacent to rectum; lungs and bones ok by PET. Bilateral JJ stents were placed 02-17-12. First CDDP was given 02-20-13. Chemo held 2-16 with acute pain and inadequate hydration; transfused 1 unit PRBCs 2-16 for Hgb 9.6. She had second CDDP on 03-10-13, then chemo held following week due to low counts. External beam RT was given 02-13-13 thru 03-17-13, 45 gray to pelvis and periaortics. She had progression in vagina and vulvar area by 06-2013, with brachytherapy given thru 07-25-13. She began weekly taxol, with plan to add carboplatin, then had additional RT to perineal progression in early 10-2013. Performance status subsequently was too poor to continue chemotherapy.  Review of systems as above, also: No fever or symptoms of infection. No cough or chest pain. No GERD.  Remainder of 10 point Review of Systems negative.  Objective:  Vital signs in last 24 hours:  BP 100/68 mmHg  Pulse 97  Temp(Src) 98.3 F (36.8 C) (Oral)  Resp 18  Ht 5\' 8"  (1.727 m)  Wt 139 lb 6.4 oz (63.231 kg)  BMI 21.20 kg/m2  LMP 03/11/2013 Weight is down 5 lbs from 10-26, 13 lbs from 10-17-13.  A little drowsy but conversant, oriented and appropriate. She has difficulty  writing out meds for refills. Ambulatory slowly without assistance. Very pleasant as always, brother very supportive. Obviously has lost weight, but appears more comfortable in general today. HEENT:PERRL, sclerae not icteric. Oral mucosa moist  without lesions, posterior pharynx clear.  Neck supple. No JVD.  Lymphatics:no cervical,supraclavicular, adenopathy Resp: clear to auscultation bilaterally and normal percussion bilaterally Cardio: regular rate and rhythm. No gallop. GI: soft, nontender, not distended, no mass or organomegaly. Quiet. Musculoskeletal/ Extremities: without pitting edema, cords, tenderness Neuro: no peripheral neuropathy. Otherwise nonfocal. PSYCH appropriate. Skin without rash, ecchymosis, petechiae   Lab Results:  Results for orders placed or performed in visit on 11/20/13  CBC with Differential  Result Value Ref Range   WBC 10.5 (H) 3.9 - 10.3 10e3/uL   NEUT# 9.0 (H) 1.5 - 6.5 10e3/uL   HGB 9.0 (L) 11.6 - 15.9 g/dL   HCT 28.6 (L) 34.8 - 46.6 %   Platelets 303 145 - 400 10e3/uL   MCV 89.1 79.5 - 101.0 fL   MCH 28.0 25.1 - 34.0 pg   MCHC 31.5 31.5 - 36.0 g/dL   RBC 3.21 (L) 3.70 - 5.45 10e6/uL   RDW 15.2 (H) 11.2 - 14.5 %   lymph# 0.6 (L) 0.9 - 3.3 10e3/uL   MONO# 0.6 0.1 - 0.9 10e3/uL   Eosinophils Absolute 0.2 0.0 - 0.5 10e3/uL   Basophils Absolute 0.0 0.0 - 0.1 10e3/uL   NEUT% 86.0 (H) 38.4 - 76.8 %   LYMPH% 5.8 (L) 14.0 - 49.7 %   MONO% 5.8 0.0 - 14.0 %   EOS% 2.2 0.0 - 7.0 %   BASO% 0.2 0.0 - 2.0 %   hemoglobin stable  Studies/Results:  No results found.  Medications: I have reviewed the patient's current medications. Continue duragesic at 300 mcg q 72 hrs and prn dilaudid for now. If/ when this does not control pain, hopefully can begin IV infusion via PAC. Continue laxatives. Needs to continue protonix due to deep duodenal ulcer.  DISCUSSION: Patient and brother would like Hospice referral. I have spoken directly with [facility] [phone]), information to be faxed.  As requested, Hospice will contact sister in law Patty to set up initial meeting.  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 Palliative care with Hospice assistance. I am glad to see her back on either scheduled or prn basis, and appreciate assistance from Hospice with her care. 2.duodenal ulcer: cratered but not actively bleeding at upper endoscopy 09-14-13, symptoms much better with bid Protonix.  3.JJ stents for obstruction from cervical cancer. Followed by Dr Louis Meckel. If she is fairly stable when stents due change, I would suggest having this done, as it may keep her more comfortable. 4.weight loss and poor nutritional status in setting of chronic illness, and social concerns: hospice assistance likely will be most helpful 5.anemia related to iron deficiency, pelvic RT, previous chemo, chronic illness. IV feraheme given 10-04-13 and ok to be off oral iron.  6.Living Will and HCPOA in place. She has made funeral arrangements today. 7.flu vaccine done 8.renal insufficiency: no dramatic changes, multifactorial with obstruction and difficult po intake.   Patient and brother are in agreement with plan and express appreciation for care.    LIVESAY,LENNIS P, MD   11/20/2013, 3:25 PM

## 2013-11-20 NOTE — Telephone Encounter (Signed)
Faxed demographics, medication sheet, and office notes to Hospice as requested by Dr. [facility] [place]  Fax: [phone]

## 2013-11-21 ENCOUNTER — Other Ambulatory Visit: Payer: Self-pay | Admitting: Oncology

## 2013-11-21 ENCOUNTER — Ambulatory Visit (HOSPITAL_COMMUNITY)
Admission: RE | Admit: 2013-11-21 | Discharge: 2013-11-21 | Disposition: A | Discharge status: Home / Self Care | Payer: BC Managed Care – PPO | Source: Ambulatory Visit | Attending: Oncology | Admitting: Oncology

## 2013-11-21 ENCOUNTER — Encounter (HOSPITAL_COMMUNITY): Payer: Self-pay

## 2013-11-21 DIAGNOSIS — C7982 Secondary malignant neoplasm of genital organs: Secondary | ICD-10-CM | POA: Insufficient documentation

## 2013-11-21 DIAGNOSIS — J45909 Unspecified asthma, uncomplicated: Secondary | ICD-10-CM | POA: Insufficient documentation

## 2013-11-21 DIAGNOSIS — C539 Malignant neoplasm of cervix uteri, unspecified: Secondary | ICD-10-CM

## 2013-11-21 DIAGNOSIS — C801 Malignant (primary) neoplasm, unspecified: Secondary | ICD-10-CM | POA: Diagnosis not present

## 2013-11-21 HISTORY — PX: OTHER SURGICAL HISTORY: SHX169

## 2013-11-21 LAB — CBC WITH DIFFERENTIAL/PLATELET
Basophils Absolute: 0 10*3/uL (ref 0.0–0.1)
Basophils Relative: 0 % (ref 0–1)
EOS PCT: 2 % (ref 0–5)
Eosinophils Absolute: 0.1 10*3/uL (ref 0.0–0.7)
HCT: 27.9 % — ABNORMAL LOW (ref 36.0–46.0)
Hemoglobin: 8.8 g/dL — ABNORMAL LOW (ref 12.0–15.0)
LYMPHS ABS: 0.3 10*3/uL — AB (ref 0.7–4.0)
Lymphocytes Relative: 4 % — ABNORMAL LOW (ref 12–46)
MCH: 28.1 pg (ref 26.0–34.0)
MCHC: 31.5 g/dL (ref 30.0–36.0)
MCV: 89.1 fL (ref 78.0–100.0)
MONO ABS: 0.5 10*3/uL (ref 0.1–1.0)
Monocytes Relative: 7 % (ref 3–12)
Neutro Abs: 7.2 10*3/uL (ref 1.7–7.7)
Neutrophils Relative %: 87 % — ABNORMAL HIGH (ref 43–77)
Platelets: 325 10*3/uL (ref 150–400)
RBC: 3.13 MIL/uL — AB (ref 3.87–5.11)
RDW: 15.2 % (ref 11.5–15.5)
WBC: 8.2 10*3/uL (ref 4.0–10.5)

## 2013-11-21 LAB — PROTIME-INR
INR: 1.07 (ref 0.00–1.49)
PROTHROMBIN TIME: 14.1 s (ref 11.6–15.2)

## 2013-11-21 LAB — APTT: aPTT: 32 seconds (ref 24–37)

## 2013-11-21 MED ORDER — VANCOMYCIN HCL IN DEXTROSE 1-5 GM/200ML-% IV SOLN
1000.0000 mg | INTRAVENOUS | Status: AC
  Administered 2013-11-21: 1000 mg via INTRAVENOUS
  Filled 2013-11-21: qty 200

## 2013-11-21 MED ORDER — ONDANSETRON HCL 4 MG/2ML IJ SOLN
4.0000 mg | Freq: Once | INTRAMUSCULAR | Status: AC
  Administered 2013-11-21: 4 mg via INTRAVENOUS

## 2013-11-21 MED ORDER — HEPARIN SOD (PORK) LOCK FLUSH 100 UNIT/ML IV SOLN
INTRAVENOUS | Status: AC
  Filled 2013-11-21: qty 5

## 2013-11-21 MED ORDER — ONDANSETRON HCL 4 MG/2ML IJ SOLN
INTRAMUSCULAR | Status: AC
  Filled 2013-11-21: qty 2

## 2013-11-21 MED ORDER — SODIUM CHLORIDE 0.9 % IV SOLN
INTRAVENOUS | Status: DC
  Administered 2013-11-21: 10:00:00 via INTRAVENOUS

## 2013-11-21 MED ORDER — MIDAZOLAM HCL 2 MG/2ML IJ SOLN
INTRAMUSCULAR | Status: AC
  Filled 2013-11-21: qty 4

## 2013-11-21 MED ORDER — LIDOCAINE HCL 1 % IJ SOLN
INTRAMUSCULAR | Status: AC
  Filled 2013-11-21: qty 20

## 2013-11-21 MED ORDER — FENTANYL CITRATE 0.05 MG/ML IJ SOLN
INTRAMUSCULAR | Status: AC | PRN
  Administered 2013-11-21: 12.5 ug via INTRAVENOUS

## 2013-11-21 MED ORDER — FENTANYL CITRATE 0.05 MG/ML IJ SOLN
INTRAMUSCULAR | Status: AC
  Filled 2013-11-21: qty 4

## 2013-11-21 NOTE — H&P (Signed)
Chief Complaint: "I'm here for a port a cath"  Referring Physician(s): Livesay,Lennis P  History of Present Illness: Gina Barnett is a 58 y.o. female with history of symptomatic progressive metastatic squamous cell carcinoma of the cervix who presents today for port a cath placement for medications (dilaudid drip, possible chemotherapy).  Past Medical History  Diagnosis Date  . Hemorrhoids   . Elevated CEA   . Retroperitoneal lymphadenopathy   . Iron deficiency anemia   . Environmental allergies   . Low back pain   . Asthma   . Chronic constipation   . Eczema   . History of radiation therapy 02/13/2013-03/17/2013    pelvis and periaortic area 45 gray  . Extrinsic ureteral obstruction     bilateral malignant external ureter obstruction  . History of dysfunctional uterine bleeding     stopped bleeding  in approx.  feb  2015  . Radiation 06/27/13, 07/11/13, 07/18/13, 07/25/13    HDR to mid to distal vagina 24 gray  . Cervical cancer INVASIVE STAGE IIIB SQUAMOUS CELL CARCINOMA  W/ NODAL METS (pelvic & periaortic nodes)    ONCOLOGIST--  DR Marko Plume--  FIRST CHEMO  TO START 02-20-2013    Past Surgical History  Procedure Laterality Date  . Colposcopy    . Tonsillectomy  1960's  . Cystoscopy w/ ureteral stent placement Bilateral 02/16/2013    Procedure: CYSTOSCOPY WITH BILATERAL  RETROGRADE PYELOGRAM/BILATERAL  STENT PLACEMENT;  Surgeon: Ardis Hughs, MD;  Location: [facility];  Service: Urology;  Laterality: Bilateral;  . Cystoscopy with retrograde pyelogram, ureteroscopy and stent placement Bilateral 05/12/2013    Procedure: BILATERAL STENT EXCHANGE/BILATERAL RETROGRADE PYELOGRAM;  Surgeon: Ardis Hughs, MD;  Location: [facility];  Service: Urology;  Laterality: Bilateral;  . Examination under anesthesia N/A 06/16/2013    Procedure: EXAM UNDER ANESTHESIA;  Surgeon: Alvino Chapel, MD;  Location: [facility];  Service:  Gynecology;  Laterality: N/A;  . Vulva /perineum biopsy N/A 06/16/2013    Procedure: VAGINAL BIOPSY,  CLITORIS BIOPSY;  Surgeon: Alvino Chapel, MD;  Location: [place];  Service: Gynecology;  Laterality: N/A;  . Cystoscopy w/ ureteral stent placement Bilateral 08/04/2013    Procedure: BILATERAL URETERAL STENT EXCHANGE,BILATERAL RETROGRADE;  Surgeon: Ardis Hughs, MD;  Location: [facility];  Service: Urology;  Laterality: Bilateral;    Allergies: Latex and Penicillins  Medications: Prior to Admission medications   Medication Sig Start Date End Date Taking? Authorizing Provider  LORazepam (ATIVAN) 1 MG tablet Take 1 tablet under tongue or swallow every 6 hours as needed for nausea. Will make drowsy. 11/20/13  Yes Lennis Marion Downer, MD  ondansetron (ZOFRAN) 8 MG tablet Take 1-2 tablets (8-16 mg total) by mouth every 12 (twelve) hours as needed for nausea or vomiting. 11/20/13  Yes Lennis Marion Downer, MD  PRESCRIPTION MEDICATION Chemo - [place]   Yes Historical Provider, MD  albuterol (PROVENTIL HFA;VENTOLIN HFA) 108 (90 BASE) MCG/ACT inhaler Inhale into the lungs every 6 (six) hours as needed for wheezing or shortness of breath.    Historical Provider, MD  clobetasol cream (TEMOVATE) 5.95 % Apply 1 application topically 2 (two) times daily as needed.     Historical Provider, MD  fentaNYL (DURAGESIC) 100 MCG/HR Place 3 patches (300 mcg total) onto the skin every 3 (three) days. 11/20/13   Lennis Marion Downer, MD  HYDROmorphone (DILAUDID) 4 MG tablet Take 1-2 tablets by mouth every 4 hours as  needed for pain. 11/20/13   Lennis Marion Downer, MD  magnesium hydroxide (MILK OF MAGNESIA) 400 MG/5ML suspension Take 5 mLs by mouth daily as needed for mild constipation.    Historical Provider, MD  pantoprazole (PROTONIX) 40 MG tablet Take 1 tablet (40 mg total) by mouth 2 (two) times daily. 11/20/13   Lennis Marion Downer, MD  polyethylene glycol (MIRALAX / GLYCOLAX) packet Take 17  g by mouth 2 (two) times daily.     Historical Provider, MD    Family History  Problem Relation Age of Onset  . Heart disease Mother   . Heart disease Father   . Hypertension Father   . Leukemia Paternal Grandfather     History   Social History  . Marital Status: Single    Spouse Name: N/A    Number of Children: 0  . Years of Education: N/A   Occupational History  . physical therapist    Social History Main Topics  . Smoking status: Never Smoker   . Smokeless tobacco: Never Used  . Alcohol Use: No  . Drug Use: No  . Sexual Activity: None   Other Topics Concern  . None   Social History Narrative         Review of Systems  Constitutional: Positive for appetite change, fatigue and unexpected weight change. Negative for fever and chills.  Respiratory: Negative for cough and shortness of breath.   Cardiovascular: Negative for chest pain.  Gastrointestinal: Positive for nausea and abdominal pain. Negative for vomiting and blood in stool.  Genitourinary: Positive for vaginal bleeding. Negative for dysuria and hematuria.  Musculoskeletal: Positive for back pain.  Neurological: Negative for headaches.  Hematological: Does not bruise/bleed easily.    Vital Signs: BP 107/74 mmHg  Pulse 97  Temp(Src) 97.7 F (36.5 C) (Oral)  Resp 16  SpO2 100%  LMP 03/11/2013  Physical Exam  Constitutional: She is oriented to person, place, and time.  Frail appearing thin WF  Cardiovascular: Normal rate and regular rhythm.   Pulmonary/Chest: Effort normal and breath sounds normal.  Abdominal: Soft. Bowel sounds are normal. There is tenderness.  Tumor mass at mons   Musculoskeletal: Normal range of motion. She exhibits no edema.  Neurological: She is alert and oriented to person, place, and time.    Imaging: No results found.  Labs:  CBC:  Recent Labs  10/25/13 1300 11/06/13 1521 11/20/13 1458 11/21/13 1000  WBC 11.0* 11.4* 10.5* 8.2  HGB 9.1* 8.9* 9.0* 8.8*    HCT 28.5* 28.7* 28.6* 27.9*  PLT 391 387 303 325    COAGS: No results for input(s): INR, APTT in the last 8760 hours.  BMP:  Recent Labs  10/24/13 1559 10/25/13 1300 11/06/13 1522 11/20/13 1504  NA 136 136 133* 134*  K 4.1 3.6 4.4 4.8  CO2 26 27 29 28   GLUCOSE 122 125 123 136  BUN 10.4 9.6 10.5 11.6  CALCIUM 10.1 10.3 10.3 10.4  CREATININE 1.7* 1.6* 1.6* 1.8*    LIVER FUNCTION TESTS:  Recent Labs  10/11/13 1115 10/25/13 1300 11/06/13 1522 11/20/13 1504  BILITOT 0.29 0.30 0.37 0.37  AST 8 6 7 7   ALT <6 <6 <6 <6  ALKPHOS 75 76 72 72  PROT 7.2 7.1 7.1 7.2  ALBUMIN 2.7* 2.5* 2.6* 2.7*    TUMOR MARKERS: No results for input(s): AFPTM, CEA, CA199, CHROMGRNA in the last 8760 hours.  Assessment and Plan: Gina Barnett is a 58 y.o. female with history of symptomatic  progressive metastatic squamous cell carcinoma of the cervix who presents today for port a cath placement for medications (dilaudid drip, possible chemotherapy). Details/risks of procedure d/w pt/sister with their understanding and consent.   .       SignedAutumn Messing 11/21/2013, 10:35 AM

## 2013-11-21 NOTE — Procedures (Signed)
Procedure:  Porta-cath placement Access:  Right IJ vein Findings:  SL Power Port placed with tip at cavoatrial junction.  No PTX.  OK to use.

## 2013-11-21 NOTE — Discharge Instructions (Signed)
Leave dressing on for 24 hours.  You may shower after 24 hours.  Please remove the dressing before you shower.    ° °Conscious Sedation, Adult, Care After °Refer to this sheet in the next few weeks. These instructions provide you with information on caring for yourself after your procedure. Your health care provider may also give you more specific instructions. Your treatment has been planned according to current medical practices, but problems sometimes occur. Call your health care provider if you have any problems or questions after your procedure. °WHAT TO EXPECT AFTER THE PROCEDURE  °After your procedure: °· You may feel sleepy, clumsy, and have poor balance for several hours. °· Vomiting may occur if you eat too soon after the procedure. °HOME CARE INSTRUCTIONS °· Do not participate in any activities where you could become injured for at least 24 hours. Do not: °¨ Drive. °¨ Swim. °¨ Ride a bicycle. °¨ Operate heavy machinery. °¨ Cook. °¨ Use power tools. °¨ Climb ladders. °¨ Work from a high place. °· Do not make important decisions or sign legal documents until you are improved. °· If you vomit, drink water, juice, or soup when you can drink without vomiting. Make sure you have little or no nausea before eating solid foods. °· Only take over-the-counter or prescription medicines for pain, discomfort, or fever as directed by your health care provider. °· Make sure you and your family fully understand everything about the medicines given to you, including what side effects may occur. °· You should not drink alcohol, take sleeping pills, or take medicines that cause drowsiness for at least 24 hours. °· If you smoke, do not smoke without supervision. °· If you are feeling better, you may resume normal activities 24 hours after you were sedated. °· Keep all appointments with your health care provider. °SEEK MEDICAL CARE IF: °· Your skin is pale or bluish in color. °· You continue to feel nauseous or vomit. °· Your  pain is getting worse and is not helped by medicine. °· You have bleeding or swelling. °· You are still sleepy or feeling clumsy after 24 hours. °SEEK IMMEDIATE MEDICAL CARE IF: °· You develop a rash. °· You have difficulty breathing. °· You develop any type of allergic problem. °· You have a fever. °MAKE SURE YOU: °· Understand these instructions. °· Will watch your condition. °· Will get help right away if you are not doing well or get worse. °Document Released: 10/19/2012 Document Reviewed: 10/19/2012 °ExitCare® Patient Information ©2015 [employer]. This information is not intended to replace advice given to you by your health care provider. Make sure you discuss any questions you have with your health care provider. °Implanted Port Insertion, Care After °Refer to this sheet in the next few weeks. These instructions provide you with information on caring for yourself after your procedure. Your health care provider may also give you more specific instructions. Your treatment has been planned according to current medical practices, but problems sometimes occur. Call your health care provider if you have any problems or questions after your procedure. °WHAT TO EXPECT AFTER THE PROCEDURE °After your procedure, it is typical to have the following:  °· Discomfort at the port insertion site. Ice packs to the area will help. °· Bruising on the skin over the port. This will subside in 3-4 days. °HOME CARE INSTRUCTIONS °· After your port is placed, you will get a manufacturer's information card. The card has information about your port. Keep this card with you   at all times.   °· Know what kind of port you have. There are many types of ports available.   °· Wear a medical alert bracelet in case of an emergency. This can help alert health care workers that you have a port.   °· The port can stay in for as long as your health care provider believes it is necessary.   °· A home health care nurse may give medicines and take  care of the port.   °· You or a family member can get special training and directions for giving medicine and taking care of the port at home.   °SEEK MEDICAL CARE IF:  °· Your port does not flush or you are unable to get a blood return.   °· You have a fever or chills. °SEEK IMMEDIATE MEDICAL CARE IF: °· You have new fluid or pus coming from your incision.   °· You notice a bad smell coming from your incision site.   °· You have swelling, pain, or more redness at the incision or port site.   °· You have chest pain or shortness of breath. °Document Released: 10/19/2012 Document Revised: 01/03/2013 Document Reviewed: 10/19/2012 °ExitCare® Patient Information ©2015 [employer]. This information is not intended to replace advice given to you by your health care provider. Make sure you discuss any questions you have with your health care provider. ° °

## 2013-11-23 ENCOUNTER — Telehealth: Payer: Self-pay | Admitting: *Deleted

## 2013-11-23 NOTE — Telephone Encounter (Signed)
Pain is not controlled with current Duragesic 300 mcg every 3 days and taking Dilaudid 8 mg every 4 hours. Asking for OK to increase Dilaudid to every 2 hours prn. After speaking with Dr. Mariana Kaufman nurse, Gina Barnett gave instructions for Hospice MD to assist with symptom management and use his/her judgement in pain management. Reminded RN that she has a new PAC and may benefit from a Dilaudid drip instead. Gina Barnett will consult with MD-will increase po Dilaudid to every 2 hours and check on her tomorrow or Saturday and re-evaluate.

## 2013-11-27 ENCOUNTER — Telehealth: Payer: Self-pay | Admitting: *Deleted

## 2013-11-27 NOTE — Telephone Encounter (Signed)
-----   Message from Gordy Levan, MD sent at 11/26/2013  1:09 PM EST ----- Please send my note from 11-20-13 to [facility] fax [phone]. Please be sure we have sent demographics, meds, order for Hospice to evaluate and treat, and whatever else they need. Phone for [facility] is [phone]  thanks

## 2013-11-27 NOTE — Telephone Encounter (Signed)
Faxed Dr. Mariana Kaufman office noted from 11-20-13 as noted below. [place] at [phone] and confirmed they received the fax.

## 2013-11-29 ENCOUNTER — Telehealth: Payer: Self-pay

## 2013-11-29 NOTE — Telephone Encounter (Addendum)
Fax signed Medication orders to [facility].  [phone] phone [phone]. Sent a copy of the orders to HIM to be scanned into patient's EMR.

## 2013-11-30 ENCOUNTER — Ambulatory Visit: Payer: BC Managed Care – PPO | Admitting: Radiation Oncology

## 2013-11-30 ENCOUNTER — Telehealth: Payer: Self-pay | Admitting: Oncology

## 2013-11-30 NOTE — Telephone Encounter (Signed)
Called Gina Barnett and let her know she does not need to follow up with Dr. Sondra Come because she is on hospice.  Gina Barnett verbalized agreement and said she is currently working on getting her pain under control.  She also wanted to thank Dr. Sondra Come for his care and said she appreciated how kind he was and that he took the time to listen to her.  Advised her to call if she needs anything.

## 2013-12-01 ENCOUNTER — Encounter (HOSPITAL_BASED_OUTPATIENT_CLINIC_OR_DEPARTMENT_OTHER): Payer: Self-pay | Admitting: *Deleted

## 2013-12-01 ENCOUNTER — Telehealth: Payer: Self-pay | Admitting: Oncology

## 2013-12-01 NOTE — Telephone Encounter (Signed)
S/W PT RE 12/2 APPT BEING MOVED TO 12/3 DUE TO SCHEDULE CHANGE.

## 2013-12-05 ENCOUNTER — Telehealth: Payer: Self-pay | Admitting: *Deleted

## 2013-12-05 NOTE — Telephone Encounter (Signed)
Pt called this morning and wanted to give Dr.Livesay an update on her status. Pt states she spoke with Dr. Louis Meckel this morning and the stent exchange is cancelled for tomorrow. She says that she will call the office the 1st week in January and Dr. Louis Meckel will consider doing it then depending on her status.  She also says she is out of the Hospice unit and back at home with her brother. She is on an IV Dilaudid and her pain is much much better. Passed this information along to Dr. Marko Plume and told patient to please call us back with any questions or concerns. Pt states she plans on coming 12/14/13 for appt with Dr. Marko Plume as long as she is able to.

## 2013-12-06 ENCOUNTER — Encounter (HOSPITAL_BASED_OUTPATIENT_CLINIC_OR_DEPARTMENT_OTHER): Admission: RE | Payer: Self-pay | Source: Ambulatory Visit

## 2013-12-06 ENCOUNTER — Ambulatory Visit (HOSPITAL_BASED_OUTPATIENT_CLINIC_OR_DEPARTMENT_OTHER): Admission: RE | Admit: 2013-12-06 | Payer: BC Managed Care – PPO | Source: Ambulatory Visit | Admitting: Urology

## 2013-12-06 HISTORY — DX: Duodenal ulcer, unspecified as acute or chronic, without hemorrhage or perforation: K26.9

## 2013-12-06 HISTORY — DX: Presence of other vascular implants and grafts: Z95.828

## 2013-12-06 HISTORY — DX: Disorder of kidney and ureter, unspecified: N28.9

## 2013-12-06 SURGERY — CYSTOSCOPY, FLEXIBLE, WITH STENT REPLACEMENT
Anesthesia: General | Laterality: Bilateral

## 2013-12-09 ENCOUNTER — Other Ambulatory Visit: Payer: Self-pay | Admitting: Oncology

## 2013-12-12 ENCOUNTER — Encounter (HOSPITAL_BASED_OUTPATIENT_CLINIC_OR_DEPARTMENT_OTHER): Payer: Self-pay | Admitting: Urology

## 2013-12-13 ENCOUNTER — Telehealth: Payer: Self-pay | Admitting: *Deleted

## 2013-12-13 ENCOUNTER — Ambulatory Visit: Payer: BC Managed Care – PPO | Admitting: Oncology

## 2013-12-13 NOTE — Telephone Encounter (Signed)
-----   Message from Gordy Levan, MD sent at 12/13/2013 10:51 AM EST ----- Please tell her I appreciate her calling and hope she also enjoys the holidays with her family. We can listen out and be available to see her anytime when needed.  thanks ----- Message -----    From: Gina Salles, RN    Sent: 12/13/2013  10:36 AM      To: Gordy Levan, MD  Vaughan Basta called to say she does not feel the need to keep appt tomorrow, unless you want her to come in. States she is OK and said to tell you Merry Christmas.Marland KitchenMarland Kitchen

## 2013-12-13 NOTE — Telephone Encounter (Signed)
Spoke with Vaughan Basta, very appreciative of Dr Mariana Kaufman message below

## 2013-12-14 ENCOUNTER — Ambulatory Visit: Payer: BC Managed Care – PPO | Admitting: Oncology

## 2013-12-19 ENCOUNTER — Telehealth: Payer: Self-pay | Admitting: *Deleted

## 2013-12-19 NOTE — Telephone Encounter (Signed)
Faxed signed orders back to [facility] at [phone]. Originals sent to HIM to be scanned into patient's chart.

## 2014-01-01 ENCOUNTER — Other Ambulatory Visit: Payer: Self-pay | Admitting: Oncology

## 2014-01-01 ENCOUNTER — Telehealth: Payer: Self-pay

## 2014-01-01 NOTE — Telephone Encounter (Signed)
Gina Barnett is experiencing increased pain with Basal rate of Dilaudid drip at 4mg  /hr. Told Brittney that Dr. Marko Plume said that it would be fine to increase the Basal rate to 5 mg/hr.  Brittney verbalized understanding.

## 2014-01-02 ENCOUNTER — Encounter: Payer: Self-pay | Admitting: Oncology

## 2014-01-02 NOTE — Progress Notes (Signed)
Medical Oncology   Per [facility] office [phone]), address for brother Skila Rollins sister in law Donyetta Ogletree where patient staying is [address] [place], [place]  [address].  Godfrey Pick, MD

## 2014-01-19 ENCOUNTER — Telehealth: Payer: Self-pay

## 2014-01-19 NOTE — Telephone Encounter (Signed)
Mailed signed orders dated 01-18-14 to [facility].  Sent a copy to HIM to be scanned into patient's EMR.

## 2014-01-22 ENCOUNTER — Telehealth: Payer: Self-pay

## 2014-01-22 NOTE — Telephone Encounter (Signed)
Mailed signed order Dated 01-22-14 to Hospice in enclosed envelope.  Sent a copy of the signed order to HIM to be scanned into patient's EMR.

## 2014-01-26 ENCOUNTER — Telehealth: Payer: Self-pay

## 2014-01-26 NOTE — Telephone Encounter (Signed)
Mailed signed orders dated 01-26-14 for dilaudid  PCA pump dose change.  Sent a copy to HIM to be scanned into patients EMR.

## 2014-01-30 ENCOUNTER — Telehealth: Payer: Self-pay | Admitting: *Deleted

## 2014-01-30 NOTE — Telephone Encounter (Signed)
Faxed signed orders for Dilaudid PCA to [facility].

## 2014-02-01 ENCOUNTER — Telehealth: Payer: Self-pay

## 2014-02-01 NOTE — Telephone Encounter (Signed)
Mailed signed orders dated 02-01-14 to hospice.  Sent a copy to HIM to be scanned into patient's EMR.

## 2014-02-07 ENCOUNTER — Telehealth: Payer: Self-pay

## 2014-02-07 NOTE — Telephone Encounter (Signed)
Gina Barnett called to notify Dr. Marko Plume that Gina Barnett passed away last evening and Gina Barnett  was very appreciative of all that she did for her.

## 2014-02-08 ENCOUNTER — Telehealth: Payer: Self-pay

## 2014-02-08 ENCOUNTER — Telehealth: Payer: Self-pay | Admitting: Oncology

## 2014-02-08 NOTE — Telephone Encounter (Signed)
Death cert received

## 2014-02-08 NOTE — Telephone Encounter (Signed)
Faxed signed orders for for dilaudid PCA pump dated 02-08-14 to pharmacy.  Sent a copy to HIM to be scanned into patient's EMR.

## 2014-02-12 DEATH — deceased

## 2014-02-19 ENCOUNTER — Telehealth: Payer: Self-pay | Admitting: *Deleted

## 2014-02-19 NOTE — Telephone Encounter (Signed)
Faxed signed MD orders to [facility] [place] for Dilaudid PCA. Originals sent to HIM to be scanned into patient's chart.

## 2014-03-13 ENCOUNTER — Telehealth: Payer: Self-pay

## 2014-03-13 NOTE — Telephone Encounter (Signed)
Mailed signed orders dated 03-12-14 to Hospice.  Sent a copy to HIM to be scanned into patient's EMR.

## 2015-01-28 IMAGING — CT CT CHEST W/ CM
2 of 6 series · 15 of 46 positions shown, 17 images · IV contrast (OMNIPAQUE)
Comparison: 01/25/2013 diagnostic CTs.  PET of 02/07/2013.

CLINICAL DATA: Stage IIIB cervical cancer, status post radiation
therapy. Evaluate for lung metastasis or interval change.

EXAM:
CT CHEST, ABDOMEN, AND PELVIS WITH CONTRAST
TECHNIQUE: Multidetector CT imaging of the chest, abdomen and pelvis was
performed following the standard protocol during bolus
administration of intravenous contrast.
CONTRAST:  100mL OMNIPAQUE IOHEXOL 300 MG/ML  SOLN

[Series 2: cap with st · axial · 0.82mm/px · z∈[-626,-52]mm · 12 of 133 slices shown, 14 images]
[im 9/133  soft-tissue]
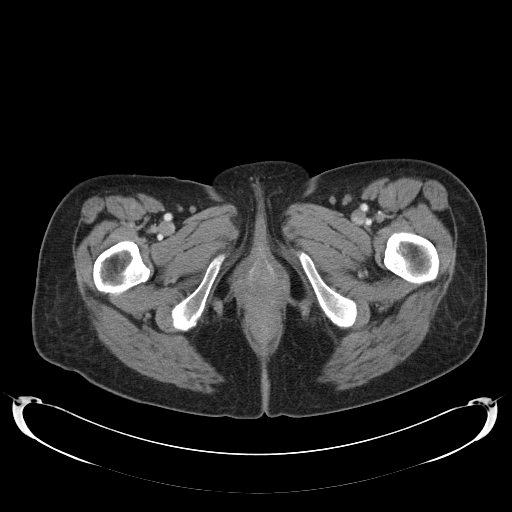
[im 9/133  bone]
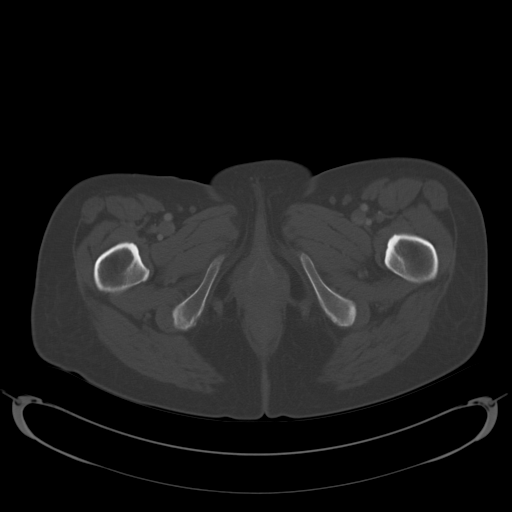
[im 17/133  soft-tissue]
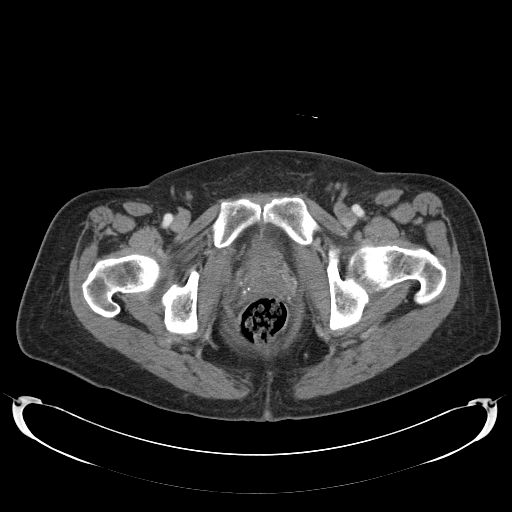
[im 34/133  soft-tissue]
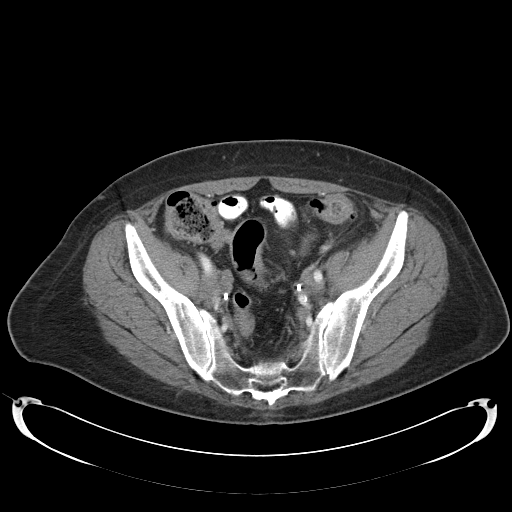
[im 42/133  soft-tissue]
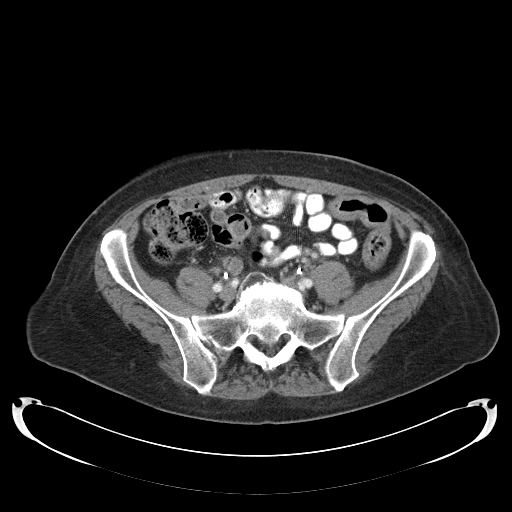
[im 50/133  soft-tissue]
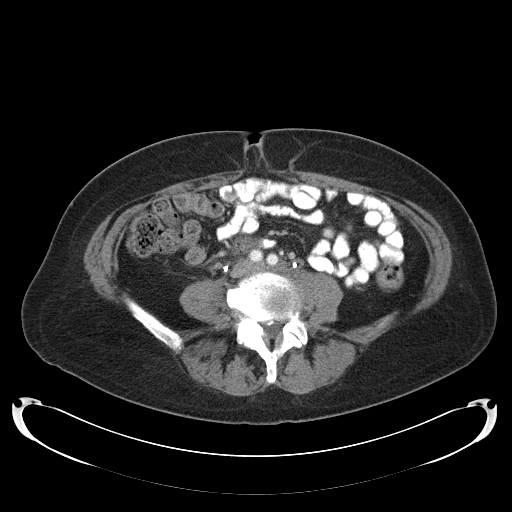
[im 58/133  soft-tissue]
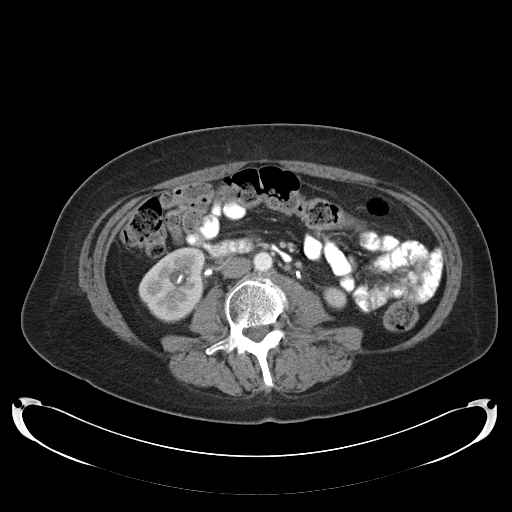
[im 75/133  soft-tissue]
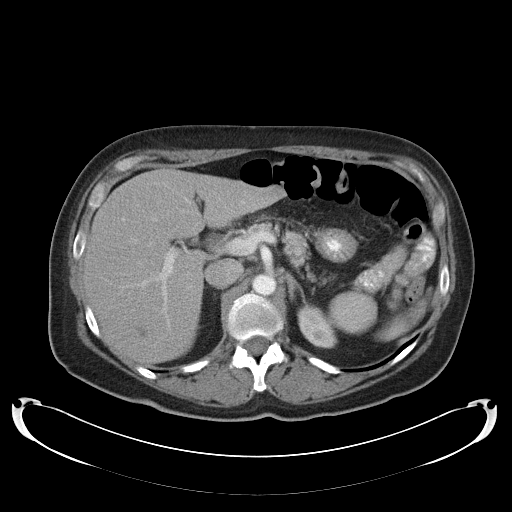
[im 83/133  soft-tissue]
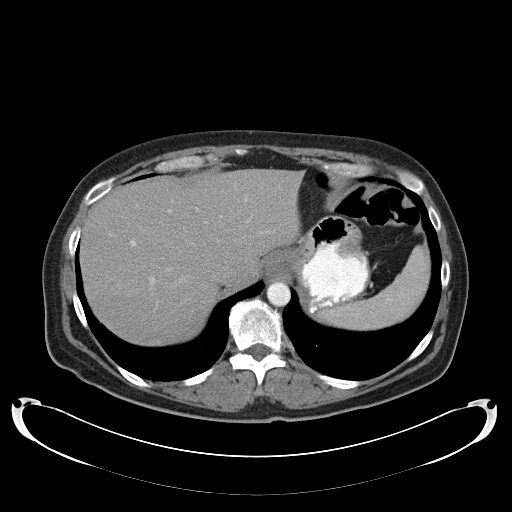
[im 91/133  soft-tissue]
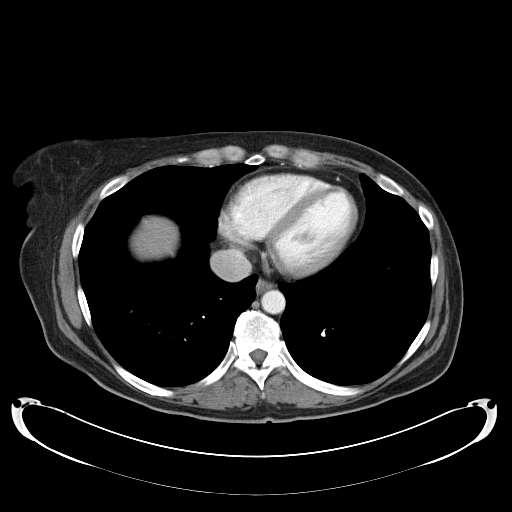
[im 91/133  bone]
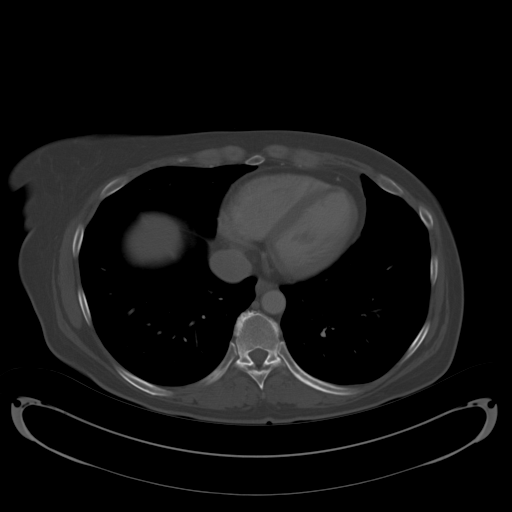
[im 100/133  soft-tissue]
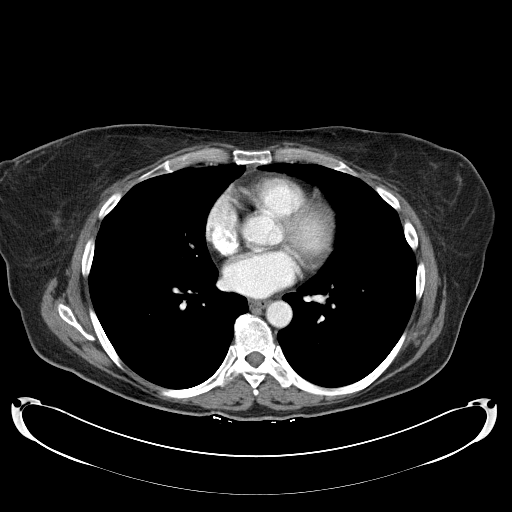
[im 116/133  soft-tissue]
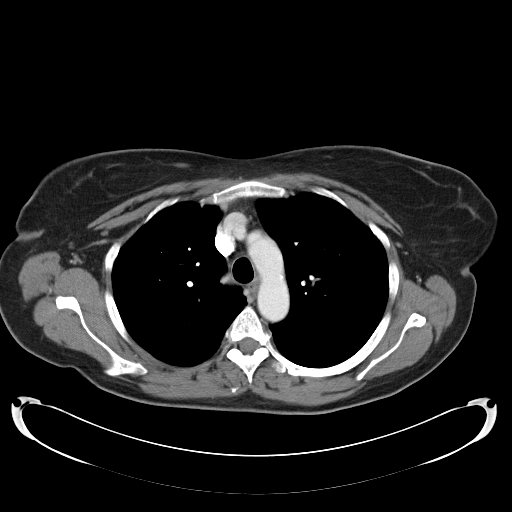
[im 124/133  soft-tissue]
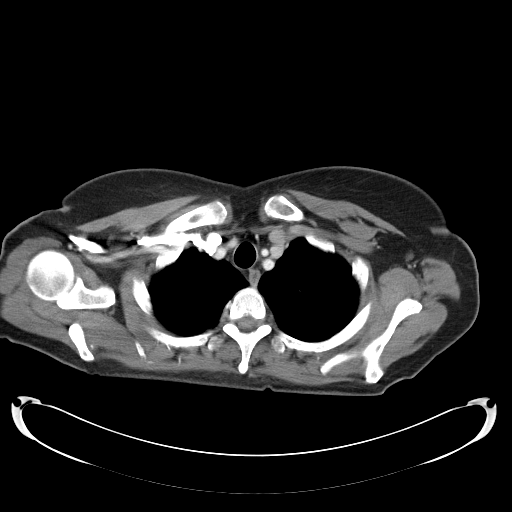

[Series 602: <mpr thick range> · coronal · 1.29mm/px · 3 of 84 slices shown]
[im 28/84  soft-tissue]
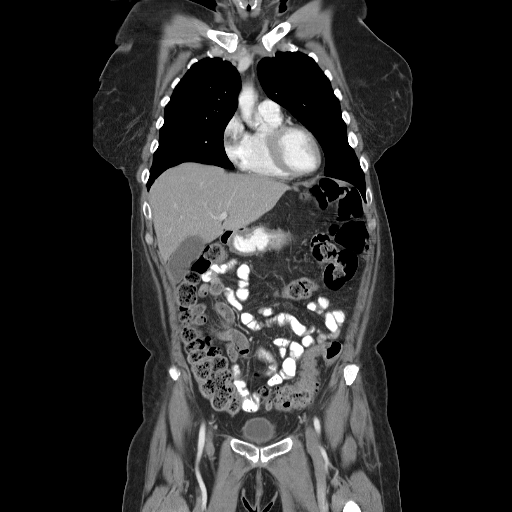
[im 37/84  soft-tissue]
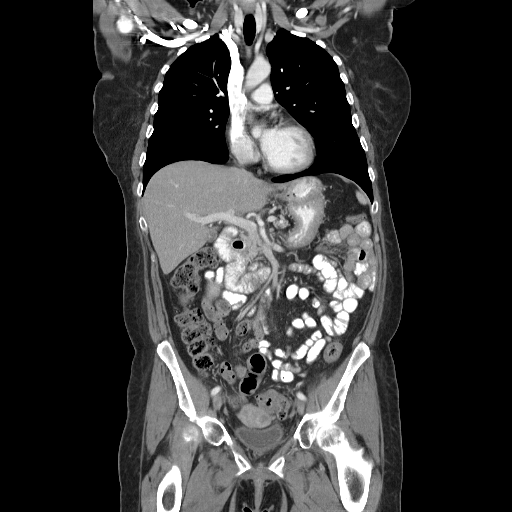
[im 47/84  soft-tissue]
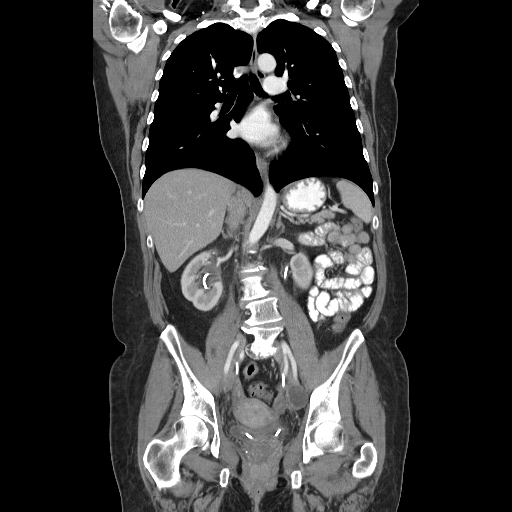

[15 of 46 positions shown; findings below may reference images not displayed]

FINDINGS: CT CHEST FINDINGS

Lungs/Pleura: Since 01/25/2013, clearing of patchy left base
airspace disease. Resolution of previously described subpleural 4 mm
density in the right lower lobe.

No pleural fluid.

Heart/Mediastinum: No supraclavicular adenopathy. Normal heart size,
without pericardial effusion. No axillary adenopathy. The
hypermetabolic nodes described on prior PET are decreased in size.
The largest node measures 6 mm today on image 22 versus 1.3 cm on
the prior.

No central pulmonary embolism, on this non-dedicated study. No
mediastinal or hilar adenopathy.

CT ABDOMEN AND PELVIS FINDINGS

Abdomen/Pelvis: 1.0 cm right hepatic lobe lesion on image 60,
similar.

A right hepatic lobe too small to characterize lesion on image [DATE] have been obscured by metal artifact in this area on the prior
exam. Mild focal steatosis adjacent to falciform ligament.

Normal spleen, stomach. Descending duodenal diverticulum. Normal
pancreas, gallbladder, biliary tract, adrenal glands.

Mild left renal cortical atrophy. Interval placement of a left
ureteric stent since 01/25/2013. No residual hydronephrosis.

Right-sided ureteric stent also in place, without hydroureter. The
proximal portion of the stent is looped in the lower pole right
renal collecting system. Moderate right-sided caliectasis,
especially involving the upper pole.

Resolution of previously described retroperitoneal adenopathy.

Colonic diverticulosis with probable muscular hypertrophy involving
the sigmoid. Colonic stool burden suggests constipation. Normal
terminal ileum. Normal small bowel without abdominal ascites.

Improvement in right pelvic sidewall adenopathy. Nodal tissue
measures 1.6 x 2.5 cm on image 101 versus 4.5 x 3.6 cm on the prior
CT.

Resolution of bilateral inguinal adenopathy.

Both ureteric stents terminating within the urinary bladder. Small
uterine fundal fibroid. Marked improvement in soft tissue fullness
involving the uterine cervix. Ill-defined hypoattenuation measures
3.3 x 4.0 cm on image 110. 10.8 x 8.7 cm on the prior head. Trace
cul-de-sac fluid. Resolution of presacral adenopathy.

Bones/Musculoskeletal: Mild convex right lumbar spine curvature.
Degenerative disc disease at the lumbosacral junction and L3-4
level.
IMPRESSION: CT CHEST IMPRESSION

1.  No acute process or evidence of metastatic disease in the chest.
2. Decreased size of presumably reactive right axillary nodes since
prior CT and PET.
3. Resolution of left lower lobe pneumonia.

CT ABDOMEN AND PELVIS IMPRESSION

1. Marked response to therapy of cervical primary and abdominal
pelvic adenopathy.
2. Placement of bilateral ureteric stents. Residual caliectasis
involving the right kidney, especially the inter and upper pole
regions. The stent is looped in the lower pole right renal
collecting system.
3. Uterine fibroid.
4. Trace cul-de-sac fluid.
5. Similar right hepatic lobe low-density lesion. Given absence of
activity on prior PET, favor a cyst or minimally complex cyst.
Recommend attention on follow-up.

## 2015-07-01 ENCOUNTER — Other Ambulatory Visit: Payer: Self-pay | Admitting: Nurse Practitioner

## 2015-08-16 IMAGING — US IR FLUORO GUIDE CV LINE*R*
1 series · 1 of 1 positions shown · non-contrast
Comparison: none

CLINICAL DATA: Cervical carcinoma and need for porta cath for
treatment.

[Series 1: sp fluoro guide cv line*left* · 1 of 1 slices shown]
[im 1/1]
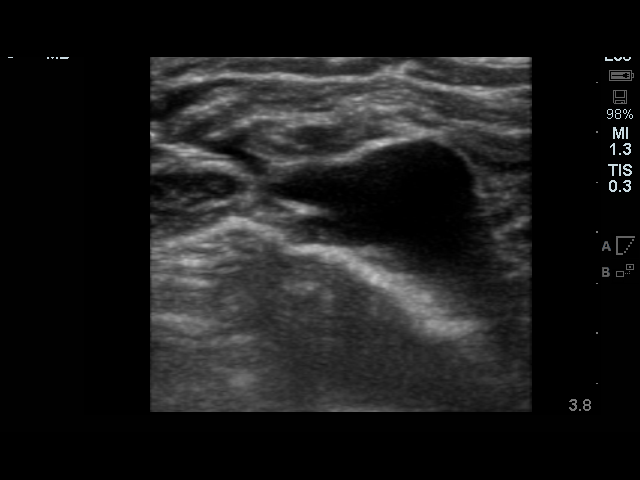

[1 of 1 positions shown; findings below may reference images not displayed]

EXAM:
IMPLANTED PORT A CATH PLACEMENT WITH ULTRASOUND AND FLUOROSCOPIC
GUIDANCE

ANESTHESIA/SEDATION:
12.5 mcg IV Fentanyl

Total Moderate Sedation Time:  38 minutes

Additional Medications: 1 g IV vancomycin. As antibiotic
prophylaxis, Ancef was ordered pre-procedure and administered
intravenously within one hour of incision.

FLUOROSCOPY TIME:  18 seconds.

PROCEDURE:
The procedure, risks, benefits, and alternatives were explained to
the patient. Questions regarding the procedure were encouraged and
answered. The patient understands and consents to the procedure.

The right neck and chest were prepped with chlorhexidine in a
sterile fashion, and a sterile drape was applied covering the
operative field. Maximum barrier sterile technique with sterile
gowns and gloves were used for the procedure. Local anesthesia was
provided with 1% lidocaine. A time-out procedure was performed.

Patency of the right internal jugular vein was confirmed by
ultrasound. After creating a small venotomy incision, a 21 gauge
needle was advanced into the right internal jugular vein under
direct, real-time ultrasound guidance. Ultrasound image
documentation was performed. After securing guidewire access, an 8
Fr dilator was placed. A J-wire was kinked to measure appropriate
catheter length.

A subcutaneous port pocket was then created along the upper chest
wall utilizing sharp and blunt dissection. Portable cautery was
utilized. The pocket was irrigated with sterile saline.

A single lumen power injectable port was chosen for placement. The 8
Fr catheter was tunneled from the port pocket site to the venotomy
incision. The port was placed in the pocket and secured. External
catheter was trimmed to appropriate length based on guidewire
measurement.

At the venotomy, an 8 Fr peel-away sheath was placed over a
guidewire. The catheter was then placed through the sheath and the
sheath removed. Final catheter positioning was confirmed and
documented with a fluoroscopic spot image. The port was accessed
with a needle and aspirated and flushed with heparinized saline. The
needle was removed.

The venotomy and port pocket incisions were closed with subcutaneous
3-0 Monocryl and subcuticular 4-0 Vicryl. Dermabond was applied to
both incisions.

COMPLICATIONS:
None
FINDINGS: After catheter placement, the tip lies at the cavoatrial junction.
The catheter aspirates normally and is ready for immediate use.
IMPRESSION: Placement of single lumen port a cath via right internal jugular
vein. The catheter tip lies at the cavoatrial junction. A power
injectable port a cath was placed and is ready for immediate use.
